# Patient Record
Sex: Female | Born: 1995 | Race: Black or African American | Hispanic: No | Smoking: Never smoker
Health system: Southern US, Community
[De-identification: ages and names within clinical notes are randomized; demographics above are authoritative.]

## PROBLEM LIST (undated history)

## (undated) ENCOUNTER — Inpatient Hospital Stay: Payer: Self-pay

## (undated) DIAGNOSIS — D573 Sickle-cell trait: Secondary | ICD-10-CM

## (undated) DIAGNOSIS — D649 Anemia, unspecified: Secondary | ICD-10-CM

## (undated) DIAGNOSIS — F32A Depression, unspecified: Secondary | ICD-10-CM

## (undated) DIAGNOSIS — F329 Major depressive disorder, single episode, unspecified: Secondary | ICD-10-CM

## (undated) HISTORY — DX: Depression, unspecified: F32.A

## (undated) HISTORY — DX: Sickle-cell trait: D57.3

## (undated) HISTORY — PX: MASTECTOMY: SHX3

## (undated) HISTORY — DX: Morbid (severe) obesity due to excess calories: E66.01

## (undated) HISTORY — DX: Major depressive disorder, single episode, unspecified: F32.9

## (undated) HISTORY — PX: TONSILLECTOMY: SUR1361

---
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
## 2017-02-02 ENCOUNTER — Encounter: Payer: Self-pay | Admitting: Emergency Medicine

## 2017-02-02 DIAGNOSIS — K529 Noninfective gastroenteritis and colitis, unspecified: Secondary | ICD-10-CM | POA: Insufficient documentation

## 2017-02-02 DIAGNOSIS — N390 Urinary tract infection, site not specified: Secondary | ICD-10-CM | POA: Diagnosis not present

## 2017-02-02 DIAGNOSIS — Z87891 Personal history of nicotine dependence: Secondary | ICD-10-CM | POA: Diagnosis not present

## 2017-02-02 DIAGNOSIS — R1030 Lower abdominal pain, unspecified: Secondary | ICD-10-CM | POA: Diagnosis present

## 2017-02-02 LAB — POCT PREGNANCY, URINE: Preg Test, Ur: NEGATIVE

## 2017-02-02 LAB — URINALYSIS, COMPLETE (UACMP) WITH MICROSCOPIC
BILIRUBIN URINE: NEGATIVE
Glucose, UA: NEGATIVE mg/dL
Ketones, ur: NEGATIVE mg/dL
Nitrite: NEGATIVE
PROTEIN: 100 mg/dL — AB
SPECIFIC GRAVITY, URINE: 1.025 (ref 1.005–1.030)
pH: 8 (ref 5.0–8.0)

## 2017-02-02 LAB — COMPREHENSIVE METABOLIC PANEL
ALBUMIN: 4.2 g/dL (ref 3.5–5.0)
ALK PHOS: 73 U/L (ref 38–126)
ALT: 34 U/L (ref 14–54)
AST: 38 U/L (ref 15–41)
Anion gap: 7 (ref 5–15)
BILIRUBIN TOTAL: 0.6 mg/dL (ref 0.3–1.2)
BUN: 6 mg/dL (ref 6–20)
CALCIUM: 9 mg/dL (ref 8.9–10.3)
CO2: 26 mmol/L (ref 22–32)
Chloride: 107 mmol/L (ref 101–111)
Creatinine, Ser: 0.58 mg/dL (ref 0.44–1.00)
GFR calc Af Amer: >60 mL/min (ref >60)
GFR calc non Af Amer: >60 mL/min (ref >60)
Glucose, Bld: 97 mg/dL (ref 65–99)
POTASSIUM: 2.9 mmol/L — AB (ref 3.5–5.1)
SODIUM: 140 mmol/L (ref 135–145)
Total Protein: 8.2 g/dL — ABNORMAL HIGH (ref 6.5–8.1)

## 2017-02-02 LAB — CBC
HEMATOCRIT: 39.2 % (ref 35.0–47.0)
Hemoglobin: 13.6 g/dL (ref 12.0–16.0)
MCH: 24.1 pg — ABNORMAL LOW (ref 26.0–34.0)
MCHC: 34.7 g/dL (ref 32.0–36.0)
MCV: 69.4 fL — AB (ref 80.0–100.0)
Platelets: 285 10*3/uL (ref 150–440)
RBC: 5.66 MIL/uL — ABNORMAL HIGH (ref 3.80–5.20)
RDW: 14.9 % — AB (ref 11.5–14.5)
WBC: 11 10*3/uL (ref 3.6–11.0)

## 2017-02-02 LAB — HCG, QUANTITATIVE, PREGNANCY

## 2017-02-02 LAB — LIPASE, BLOOD: Lipase: 20 U/L (ref 11–51)

## 2017-02-02 NOTE — ED Triage Notes (Signed)
Pt arrives ambulatory to triage with c/o lower abdominal pain. Pt states that she has been having this pain x2 weeks. Pt reports regular bowel movements. Pt is in NAD at this time.

## 2017-02-03 ENCOUNTER — Emergency Department
Admission: EM | Admit: 2017-02-03 | Discharge: 2017-02-03 | Disposition: A | Discharge status: Home / Self Care | Payer: Medicaid Other | Attending: Emergency Medicine | Admitting: Emergency Medicine

## 2017-02-03 ENCOUNTER — Emergency Department: Payer: Medicaid Other

## 2017-02-03 DIAGNOSIS — N390 Urinary tract infection, site not specified: Secondary | ICD-10-CM

## 2017-02-03 DIAGNOSIS — K529 Noninfective gastroenteritis and colitis, unspecified: Secondary | ICD-10-CM

## 2017-02-03 MED ORDER — CIPROFLOXACIN HCL 500 MG PO TABS: 500.0000 mg | ORAL_TABLET | Freq: Two times a day (BID) | ORAL | 0 refills | Status: AC

## 2017-02-03 MED ORDER — METRONIDAZOLE 500 MG PO TABS
500.0000 mg | ORAL_TABLET | Freq: Once | ORAL | Status: AC
  Administered 2017-02-03: 500 mg via ORAL
  Filled 2017-02-03: qty 1

## 2017-02-03 MED ORDER — CIPROFLOXACIN HCL 500 MG PO TABS
500.0000 mg | ORAL_TABLET | ORAL | Status: AC
  Administered 2017-02-03: 500 mg via ORAL
  Filled 2017-02-03: qty 1

## 2017-02-03 MED ORDER — IOPAMIDOL (ISOVUE-300) INJECTION 61%
30.0000 mL | Freq: Once | INTRAVENOUS | Status: AC
  Administered 2017-02-03: 30 mL via ORAL

## 2017-02-03 MED ORDER — POTASSIUM CHLORIDE CRYS ER 20 MEQ PO TBCR: 20.0000 meq | EXTENDED_RELEASE_TABLET | Freq: Every day | ORAL | 0 refills | Status: DC

## 2017-02-03 MED ORDER — ONDANSETRON 4 MG PO TBDP: ORAL_TABLET | ORAL | 0 refills | Status: DC

## 2017-02-03 MED ORDER — HYDROCODONE-ACETAMINOPHEN 5-325 MG PO TABS: 1.0000 | ORAL_TABLET | ORAL | 0 refills | Status: DC | PRN

## 2017-02-03 MED ORDER — IOPAMIDOL (ISOVUE-300) INJECTION 61%
100.0000 mL | Freq: Once | INTRAVENOUS | Status: AC | PRN
  Administered 2017-02-03: 100 mL via INTRAVENOUS

## 2017-02-03 MED ORDER — POTASSIUM CHLORIDE CRYS ER 20 MEQ PO TBCR
40.0000 meq | EXTENDED_RELEASE_TABLET | Freq: Once | ORAL | Status: AC
  Administered 2017-02-03: 40 meq via ORAL
  Filled 2017-02-03: qty 2

## 2017-02-03 MED ORDER — METRONIDAZOLE 500 MG PO TABS: 500.0000 mg | ORAL_TABLET | Freq: Three times a day (TID) | ORAL | 0 refills | Status: AC

## 2017-02-03 MED ORDER — HYDROCODONE-ACETAMINOPHEN 5-325 MG PO TABS
2.0000 | ORAL_TABLET | Freq: Once | ORAL | Status: AC
  Administered 2017-02-03: 2 via ORAL
  Filled 2017-02-03: qty 2

## 2017-02-03 NOTE — ED Provider Notes (Signed)
[facility] Emergency Department Provider Note  ____________________________________________   First MD Initiated Contact with Patient 02/03/17 0041     (approximate)  I have reviewed the triage vital signs and the nursing notes.   HISTORY  Chief Complaint Abdominal Pain    HPI Cindy [name] is a 21 y.o. female who presents for evaluation of gradually worsening abdominal pain 2 weeks.  She reports some intermittent nausea but no vomiting and she has had normal bowel movements, specifically no diarrhea.  She states that the pain is throughout her lower abdomen and nothing in particular makes it better nor worse.  It is a dull and aching pain but severe in intensity.  She is having trouble sleeping over the last few days as a result of the severity of the pain and it is affecting her daily life.  She has a 75-month-old baby and is difficult for her to pick up the baby due to the pain.  She does not have a regular doctor and does not have a gastroenterologist.  She denies fever/chills, chest pain, shortness of breath, dysuria. No vaginal bleeding, no significant vaginal discharge, no pelvic pain.   History reviewed. No pertinent past medical history.  Patient Active Problem List   Diagnosis Date Noted  . Postpartum care following vaginal delivery 10/24/2016    Past Surgical History:  Procedure Laterality Date  . TONSILLECTOMY      Prior to Admission medications   Medication Sig Start Date End Date Taking? Authorizing Provider  ciprofloxacin (CIPRO) 500 MG tablet Take 1 tablet (500 mg total) by mouth 2 (two) times daily. 02/03/17 02/10/17  Hinda Kehr, MD  HYDROcodone-acetaminophen (NORCO/VICODIN) 5-325 MG tablet Take 1-2 tablets by mouth every 4 (four) hours as needed for moderate pain. 02/03/17   Hinda Kehr, MD  metroNIDAZOLE (FLAGYL) 500 MG tablet Take 1 tablet (500 mg total) by mouth 3 (three) times daily. 02/03/17 02/10/17  Hinda Kehr, MD    ondansetron (ZOFRAN ODT) 4 MG disintegrating tablet Allow 1-2 tablets to dissolve in your mouth every 8 hours as needed for nausea/vomiting 02/03/17   Hinda Kehr, MD  potassium chloride SA (KLOR-CON M20) 20 MEQ tablet Take 1 tablet (20 mEq total) by mouth daily. 02/03/17   Hinda Kehr, MD  Prenatal Vit-Fe Fumarate-FA (MULTIVITAMIN-PRENATAL) 27-0.8 MG TABS tablet Take 1 tablet by mouth daily at 12 noon.    Historical Provider, MD    Allergies Patient has no known allergies.  Family History  Problem Relation Age of Onset  . Thyroid disease Mother   . Hypertension Mother   . Sickle cell trait Mother   . Sickle cell anemia Sister   . Hypertension Maternal Grandmother     Social History Social History  Substance Use Topics  . Smoking status: Former Research scientist (life sciences)  . Smokeless tobacco: Never Used  . Alcohol use No     Comment: former    Review of Systems Constitutional: No fever/chills Eyes: No visual changes. ENT: No sore throat. Cardiovascular: Denies chest pain. Respiratory: Denies shortness of breath. Gastrointestinal: Worsening lower abdominal pain 3 weeks with no bowel changes, no vomiting, occasional nausea Genitourinary: Negative for dysuria. Musculoskeletal: Negative for back pain. Skin: Negative for rash. Neurological: Negative for headaches, focal weakness or numbness.  10-point ROS otherwise negative.  ____________________________________________   PHYSICAL EXAM:  VITAL SIGNS: ED Triage Vitals  Enc Vitals Group     BP 02/02/17 2151 138/84     Pulse Rate 02/02/17 2151 100  Resp 02/02/17 2151 18     Temp 02/02/17 2151 99.2 F (37.3 C)     Temp Source 02/02/17 2151 Oral     SpO2 02/02/17 2151 100 %     Weight 02/02/17 2154 171 lb (77.6 kg)     Height 02/02/17 2154 5\' 2"  (1.575 m)     Head Circumference --      Peak Flow --      Pain Score 02/02/17 2151 10     Pain Loc --      Pain Edu? --      Excl. in [place]? --     Constitutional: Alert and oriented.  Well appearing and in no acute distress. Eyes: Conjunctivae are normal. PERRL. EOMI. Head: Atraumatic. Nose: No congestion/rhinnorhea. Mouth/Throat: Mucous membranes are moist. Neck: No stridor.  No meningeal signs.   Cardiovascular: Normal rate, regular rhythm. Good peripheral circulation. Grossly normal heart sounds. Respiratory: Normal respiratory effort.  No retractions. Lungs CTAB. Gastrointestinal: Soft with diffuse tenderness to palpation throughout the abdomen.  No distention.  No rebound and no guarding Genitourinary: Deferred Musculoskeletal: No lower extremity tenderness nor edema. No gross deformities of extremities. Neurologic:  Normal speech and language. No gross focal neurologic deficits are appreciated.  Skin:  Skin is warm, dry and intact. No rash noted. Psychiatric: Mood and affect are normal. Speech and behavior are normal.  ____________________________________________   LABS (all labs ordered are listed, but only abnormal results are displayed)  Labs Reviewed  COMPREHENSIVE METABOLIC PANEL - Abnormal; Notable for the following:       Result Value   Potassium 2.9 (*)    Total Protein 8.2 (*)    All other components within normal limits  CBC - Abnormal; Notable for the following:    RBC 5.66 (*)    MCV 69.4 (*)    MCH 24.1 (*)    RDW 14.9 (*)    All other components within normal limits  URINALYSIS, COMPLETE (UACMP) WITH MICROSCOPIC - Abnormal; Notable for the following:    Color, Urine YELLOW (*)    APPearance HAZY (*)    Hgb urine dipstick MODERATE (*)    Protein, ur 100 (*)    Leukocytes, UA TRACE (*)    Bacteria, UA RARE (*)    Squamous Epithelial / LPF 6-30 (*)    All other components within normal limits  URINE CULTURE  LIPASE, BLOOD  HCG, QUANTITATIVE, PREGNANCY  POC URINE PREG, ED  POCT PREGNANCY, URINE   ____________________________________________  EKG  None - EKG not ordered by ED  physician ____________________________________________  RADIOLOGY   Ct Abdomen Pelvis W Contrast  Result Date: 02/03/2017 CLINICAL DATA:  Lower abdominal pain EXAM: CT ABDOMEN AND PELVIS WITH CONTRAST TECHNIQUE: Multidetector CT imaging of the abdomen and pelvis was performed using the standard protocol following bolus administration of intravenous contrast. CONTRAST:  154mL ISOVUE-300 IOPAMIDOL (ISOVUE-300) INJECTION 61% COMPARISON:  None. FINDINGS: Lower chest: Lung bases demonstrate no acute consolidation or pleural effusion. Normal heart size. Hepatobiliary: No focal liver abnormality is seen. No gallstones, gallbladder wall thickening, or biliary dilatation. Pancreas: Unremarkable. No pancreatic ductal dilatation or surrounding inflammatory changes. Spleen: Upper normal in size at 13.5 cm. Adrenals/Urinary Tract: Adrenal glands are unremarkable. Kidneys are normal, without renal calculi, focal lesion, or hydronephrosis. Bladder is unremarkable. Stomach/Bowel: Stomach is nonenlarged.  No dilated small bowel. Abnormal bowel wall thickening involving the cecum, ascending colon, hepatic flexure, with patchy involvement of the splenic flexure. Thickened terminal ileum and distal ileal small  bowel loops in the anterior pelvis. The appendix is enlarged, measuring up to 9 mm in size, no surrounding inflammation. Vascular/Lymphatic: Right lower quadrant mesenteric lymph nodes. Non aneurysmal aorta Reproductive: Uterus and bilateral adnexa are unremarkable. Other: No free air or free fluid. Musculoskeletal: No acute or significant osseous findings. IMPRESSION: 1. Abnormal wall thickening of distal small bowel loops/ terminal ileum, cecum, ascending colon, hepatic flexure and splenic flexure ; the findings are suspicious for colitis as may be seen with infectious or inflammatory bowel disease. The appendix is also noted to be enlarged up to 1 cm but is without surrounding inflammation; suspect that the enlarged  appendix is part of the diffuse inflammatory process involving the distal small bowel and right colon, however appendicitis cannot be entirely excluded. Electronically Signed   By: Donavan Foil M.D.   On: 02/03/2017 03:17    ____________________________________________   PROCEDURES  Critical Care performed: No   Procedure(s) performed:   Procedures   ____________________________________________   INITIAL IMPRESSION / ASSESSMENT AND PLAN / ED COURSE  Pertinent labs & imaging results that were available during my care of the patient were reviewed by me and considered in my medical decision making (see chart for details).  The patient's labs are reassuring other than a positive UA.  Her vital signs are essentially normal except for a borderline tachycardia.  Given that the pain is affecting her daily life and she is diffusely tender to palpation throughout, I will proceed with the CT scan with oral and IV contrast.  I did discuss the risks and benefits of CT imaging given her young age but we decided it would help identify potential intra-abdominal pathology that may be the cause of her gradually worsening and now severe pain.   Clinical Course as of Feb 03 750  Sat Feb 03, 2017  0430 The patient's CT scan is consistent with diffuse colitis, either infectious or inflammatory.  It is notable that she is not having any diarrhea.  Although her appendix was enlarged the radiologist felt that this was because it was included with the bowel wall thickening and colitis but there was no surrounding inflammation that would suggest acute appendicitis.  Given that she has been having symptoms for 2 weeks, has no fever or leukocytosis, and there is no periappendiceal inflammation, I feel that it is extremely unlikely that she is suffering from appendicitis.  I will treat her empirically with Cipro and Flagyl. I looked up contraindications to these medications with breastfeeding, and the levels in  breastmilk are reportedly quite low.  I am recommending to the patient that she either pump before taking the medications, or wait 3-4 hours before feeding after taking them to reduce the dose in the milk.  [CF]  [id number] I discussed all of the results with the patient and stressed the importance of outpatient follow up with GI.    I gave my usual and customary return precautions.  She understands and agrees with the plan.  I am giving a small amount of potassium given her low potassium  [CF]  0506 Possible UTI as well, culture pending, Cipro should treat but culture will help follow up  [CF]    Clinical Course User Index [CF] Hinda Kehr, MD    ____________________________________________  FINAL CLINICAL IMPRESSION(S) / ED DIAGNOSES  Final diagnoses:  Colitis  Urinary tract infection without hematuria, site unspecified     MEDICATIONS GIVEN DURING THIS VISIT:  Medications  iopamidol (ISOVUE-300) 61 % injection 30 mL (30  mLs Oral Contrast Given 02/03/17 0143)  iopamidol (ISOVUE-300) 61 % injection 100 mL (100 mLs Intravenous Contrast Given 02/03/17 0255)  ciprofloxacin (CIPRO) tablet 500 mg (500 mg Oral Given 02/03/17 0431)  metroNIDAZOLE (FLAGYL) tablet 500 mg (500 mg Oral Given 02/03/17 0431)  HYDROcodone-acetaminophen (NORCO/VICODIN) 5-325 MG per tablet 2 tablet (2 tablets Oral Given 02/03/17 0431)  potassium chloride SA (K-DUR,KLOR-CON) CR tablet 40 mEq (40 mEq Oral Given 02/03/17 0450)     NEW OUTPATIENT MEDICATIONS STARTED DURING THIS VISIT:  Discharge Medication List as of 02/03/2017  5:07 AM    START taking these medications   Details  ciprofloxacin (CIPRO) 500 MG tablet Take 1 tablet (500 mg total) by mouth 2 (two) times daily., Starting Sat 02/03/2017, Until Sat 02/10/2017, Print    HYDROcodone-acetaminophen (NORCO/VICODIN) 5-325 MG tablet Take 1-2 tablets by mouth every 4 (four) hours as needed for moderate pain., Starting Sat 02/03/2017, Print    metroNIDAZOLE (FLAGYL) 500 MG  tablet Take 1 tablet (500 mg total) by mouth 3 (three) times daily., Starting Sat 02/03/2017, Until Sat 02/10/2017, Print    ondansetron (ZOFRAN ODT) 4 MG disintegrating tablet Allow 1-2 tablets to dissolve in your mouth every 8 hours as needed for nausea/vomiting, Print    potassium chloride SA (KLOR-CON M20) 20 MEQ tablet Take 1 tablet (20 mEq total) by mouth daily., Starting Sat 02/03/2017, Print        Discharge Medication List as of 02/03/2017  5:07 AM      Discharge Medication List as of 02/03/2017  5:07 AM       Note:  This document was prepared using Dragon voice recognition software and may include unintentional dictation errors.    Hinda Kehr, MD 02/03/17 [phone]

## 2017-02-03 NOTE — ED Notes (Signed)
Reviewed d/c instructions, follow-up care, prescriptions with patient. Pt verbalized understanding.  

## 2017-02-03 NOTE — ED Notes (Signed)
RN informed patient about additional diagnosis of UTI. RN and patient discussed signs and symptoms of worsening infection.  Patient given additional discharge information relating to UTI.  Patient verbalized understanding

## 2017-02-03 NOTE — Discharge Instructions (Signed)
Your CT scan was consistent with colitis, which is inflammation in parts of your intestines.  It may be infectious, so we are treating you with two different antibiotics; please take the full week-long course of both medications.  If you are breastfeeding, we recommend that you either pump prior to taking your medications each time, or that you wait 3-4 hours after taking the medications before you feed your baby.  Please call the office of Dr. Vicente Males and explain you were seen in the emergency department and have diffuse colitis and would like to schedule a follow-up appointment for possible inflammatory bowel disease.  They should be able to work you into the clinic relatively soon.    Return to the emergency department if you develop new or worsening symptoms that concern you.

## 2017-02-03 NOTE — ED Notes (Signed)
Pt. Was able to drink one bottle of contrast, pt. Does not think she can drink anymore.  Doctor informed, and is ok with one bottle.  CT called.

## 2017-02-04 LAB — URINE CULTURE: Special Requests: NORMAL

## 2017-03-15 ENCOUNTER — Encounter: Payer: Self-pay | Admitting: Gastroenterology

## 2017-03-15 ENCOUNTER — Other Ambulatory Visit: Payer: Self-pay

## 2017-03-15 ENCOUNTER — Ambulatory Visit (INDEPENDENT_AMBULATORY_CARE_PROVIDER_SITE_OTHER): Payer: Medicaid Other | Admitting: Gastroenterology

## 2017-03-15 ENCOUNTER — Telehealth: Payer: Self-pay

## 2017-03-15 VITALS — BP 136/82 | HR 90 | Temp 98.0°F | Ht 60.0 in | Wt 179.4 lb

## 2017-03-15 DIAGNOSIS — R718 Other abnormality of red blood cells: Secondary | ICD-10-CM

## 2017-03-15 DIAGNOSIS — K529 Noninfective gastroenteritis and colitis, unspecified: Secondary | ICD-10-CM

## 2017-03-15 DIAGNOSIS — R1013 Epigastric pain: Secondary | ICD-10-CM | POA: Diagnosis not present

## 2017-03-15 DIAGNOSIS — R809 Proteinuria, unspecified: Secondary | ICD-10-CM

## 2017-03-15 DIAGNOSIS — K51919 Ulcerative colitis, unspecified with unspecified complications: Secondary | ICD-10-CM

## 2017-03-15 DIAGNOSIS — G8929 Other chronic pain: Secondary | ICD-10-CM

## 2017-03-15 MED ORDER — OMEPRAZOLE 40 MG PO CPDR: 40.0000 mg | DELAYED_RELEASE_CAPSULE | Freq: Every day | ORAL | 3 refills | Status: DC

## 2017-03-15 NOTE — Telephone Encounter (Signed)
Gastroenterology Pre-Procedure Review  Request Date: 7/3 Requesting Physician: Dr. Vicente Males  PATIENT REVIEW QUESTIONS: The patient responded to the following health history questions as indicated:    1. Are you having any GI issues? yes (colitis, epigastric pain) 2. Do you have a personal history of Polyps? no 3. Do you have a family history of Colon Cancer or Polyps? no 4. Diabetes Mellitus? no 5. Joint replacements in the past 12 months?no 6. Major health problems in the past 3 months?no 7. Any artificial heart valves, MVP, or defibrillator?no    MEDICATIONS & ALLERGIES:    Patient reports the following regarding taking any anticoagulation/antiplatelet therapy:   Plavix, Coumadin, Eliquis, Xarelto, Lovenox, Pradaxa, Brilinta, or Effient? no Aspirin? no  Patient confirms/reports the following medications:  Current Outpatient Prescriptions  Medication Sig Dispense Refill  . omeprazole (PRILOSEC) 40 MG capsule Take 1 capsule (40 mg total) by mouth daily. 90 capsule 3  . potassium chloride SA (KLOR-CON M20) 20 MEQ tablet Take 1 tablet (20 mEq total) by mouth daily. (Patient not taking: Reported on 03/15/2017) 7 tablet 0   No current facility-administered medications for this visit.     Patient confirms/reports the following allergies:  No Known Allergies  No orders of the defined types were placed in this encounter.   AUTHORIZATION INFORMATION Primary Insurance: 1D#: Group #:  Secondary Insurance: 1D#: Group #:  SCHEDULE INFORMATION: Date: 7/3 Time: Location: [facility]

## 2017-03-15 NOTE — Progress Notes (Signed)
[name] MD, MRCP(U.K) [address]  [place]  [place], [place] [address]  Main: [phone]  Fax: [phone]   Gastroenterology Consultation  Referring Provider:   Emergency room Primary Care Physician:  Department, [facility] Primary Gastroenterologist:  Dr. [name]  Reason for Consultation:     Abdominal pain         HPI:   Cindy [name] is a 21 y.o. y/o female referred for consultation & management  by Dr. Department, [facility].    She was recently seen at the ER on 02/03/17 for abdominal pain , noted to have a alrge qty of blood and protein in her urine . CT scan of the abdoimen showed thickening of the distal small owel loops, TI,cecum ,ascending colon , hepatic flexure and splenic flexure. Enlarged appearance of the appendix. Treated with cipro and flagyl for colitis . On her labs noted to have severe mucrocytosis of 69 and a Hb of 13.6 .   Abdominal pain: Onset: began 2 months back, since the ER visit has improved a bit, she gets the pain once in a few days, each episode lasts in the morning and stops in the evening , sometimes through the night  Site :lower abdominal initially and now in the upper part  Radiation: none  Severity :"bad"  Petra Kuba of pain: sharp in nature, like a knife  Aggravating factors: siting up , meals does not make it worse but does not feel like eating  Relieving factors :when she was given antibiotics it helps, a bowel movements helps at times  Weight loss: no  NSAID use: no  PPI use :none  Gall bladder surgery: no  Frequency of bowel movements: every day , since child birth she says she has seen some blood on her stool  Change in bowel movements: none  Relief with bowel movements: yes  Gas/Bloating/Abdominal distension: yes  No family history of IBD, no skin issues, no joint issues  Her child is 4 months and is not being breast fed. She says she has a family history of sickle cell disease, has been  told she has low iron in the past .   She recalls she had found it hard to concieve.     History reviewed. No pertinent past medical history.  Past Surgical History:  Procedure Laterality Date  . TONSILLECTOMY      Prior to Admission medications   Medication Sig Start Date End Date Taking? Authorizing Provider  potassium chloride SA (KLOR-CON M20) 20 MEQ tablet Take 1 tablet (20 mEq total) by mouth daily. Patient not taking: Reported on 03/15/2017 02/03/17   Hinda Kehr, MD    Family History  Problem Relation Age of Onset  . Thyroid disease Mother   . Hypertension Mother   . Sickle cell trait Mother   . Sickle cell anemia Sister   . Hypertension Maternal Grandmother      Social History  Substance Use Topics  . Smoking status: Former Research scientist (life sciences)  . Smokeless tobacco: Never Used  . Alcohol use No     Comment: former    Allergies as of 03/15/2017  . (No Known Allergies)    Review of Systems:    All systems reviewed and negative except where noted in HPI.   Physical Exam:  BP 136/82 (BP Location: Left Arm, Patient Position: Sitting, Cuff Size: Normal)   Pulse 90   Temp 98 F (36.7 C) (Oral)   Ht 5' (1.524 m)  Wt 179 lb 6.4 oz (81.4 kg)   LMP  (LMP Unknown)   Breastfeeding? No   BMI 35.04 kg/m  No LMP recorded (lmp unknown). Patient has had an injection. Psych:  Alert and cooperative. Normal mood and affect. General:   Alert,  Well-developed, well-nourished, pleasant and cooperative in NAD Head:  Normocephalic and atraumatic. Eyes:  Sclera clear, no icterus.   Conjunctiva pink. Ears:  Normal auditory acuity. Nose:  No deformity, discharge, or lesions. Mouth:  No deformity or lesions,oropharynx pink & moist. Neck:  Supple; no masses or thyromegaly. Lungs:  Respirations even and unlabored.  Clear throughout to auscultation.   No wheezes, crackles, or rhonchi. No acute distress. Heart:  Regular rate and rhythm; no murmurs, clicks, rubs, or gallops. Abdomen:  Normal  bowel sounds.  No bruits.  Soft, mild epigastric tenderness  and non-distended without masses, hepatosplenomegaly or hernias noted.  No guarding or rebound tenderness.    Msk:  Symmetrical without gross deformities. Good, equal movement & strength bilaterally. Pulses:  Normal pulses noted. Extremities:  No clubbing or edema.  No cyanosis. Neurologic:  Alert and oriented x3;  grossly normal neurologically. Psych:  Alert and cooperative. Normal mood and affect.  Imaging Studies: No results found.  Assessment and Plan:   Cindy [name] is a 21 y.o. y/o female has been referred for abdominal pain. Recent imaging suggested inflammation of the colon and small bowel , in addition she has microcytosis, history of rectal bleeding and feels better when she is on antibiotics. I will evaluate her for IBD-crohns disease, r/o sickle cell disease as cause for her microcytosis , check her iron levels, Will empirically treat her with a PPI   Plan  1. Repeat urine analysis , iron studies, Hb electrophoresis  2. EGD+colonoscopy for abdominal pain , evaluation of colitis, ileitis , rectal bleeding  3. Stool H pylori antigen and fecal lactoferrin   I have discussed alternative options, risks & benefits,  which include, but are not limited to, bleeding, infection, perforation,respiratory complication & drug reaction.  The patient agrees with this plan & written consent will be obtained.     Follow up in 8 weeks   Dr [name] MD,MRCP(U.K)

## 2017-04-15 ENCOUNTER — Emergency Department: Payer: Medicaid Other

## 2017-04-15 ENCOUNTER — Emergency Department
Admission: EM | Admit: 2017-04-15 | Discharge: 2017-04-15 | Disposition: A | Discharge status: Home / Self Care | Payer: Medicaid Other | Attending: Student in an Organized Health Care Education/Training Program | Admitting: Student in an Organized Health Care Education/Training Program

## 2017-04-15 ENCOUNTER — Encounter: Payer: Self-pay | Admitting: Emergency Medicine

## 2017-04-15 DIAGNOSIS — R1031 Right lower quadrant pain: Secondary | ICD-10-CM | POA: Insufficient documentation

## 2017-04-15 DIAGNOSIS — Z87891 Personal history of nicotine dependence: Secondary | ICD-10-CM | POA: Insufficient documentation

## 2017-04-15 DIAGNOSIS — Z79899 Other long term (current) drug therapy: Secondary | ICD-10-CM | POA: Diagnosis not present

## 2017-04-15 LAB — COMPREHENSIVE METABOLIC PANEL
ALBUMIN: 4.4 g/dL (ref 3.5–5.0)
ALT: 19 U/L (ref 14–54)
AST: 22 U/L (ref 15–41)
Alkaline Phosphatase: 55 U/L (ref 38–126)
Anion gap: 5 (ref 5–15)
BILIRUBIN TOTAL: 0.8 mg/dL (ref 0.3–1.2)
BUN: 10 mg/dL (ref 6–20)
CALCIUM: 9 mg/dL (ref 8.9–10.3)
CO2: 28 mmol/L (ref 22–32)
CREATININE: 0.6 mg/dL (ref 0.44–1.00)
Chloride: 104 mmol/L (ref 101–111)
GFR calc Af Amer: >60 mL/min (ref >60)
GLUCOSE: 91 mg/dL (ref 65–99)
Potassium: 3.4 mmol/L — ABNORMAL LOW (ref 3.5–5.1)
Sodium: 137 mmol/L (ref 135–145)
TOTAL PROTEIN: 8 g/dL (ref 6.5–8.1)

## 2017-04-15 LAB — URINALYSIS, COMPLETE (UACMP) WITH MICROSCOPIC
BACTERIA UA: NONE SEEN
Bilirubin Urine: NEGATIVE
Glucose, UA: NEGATIVE mg/dL
Ketones, ur: NEGATIVE mg/dL
NITRITE: NEGATIVE
PH: 6 (ref 5.0–8.0)
Protein, ur: 30 mg/dL — AB
SPECIFIC GRAVITY, URINE: 1.029 (ref 1.005–1.030)

## 2017-04-15 LAB — CBC
HEMATOCRIT: 37.9 % (ref 35.0–47.0)
Hemoglobin: 12.9 g/dL (ref 12.0–16.0)
MCH: 23.6 pg — ABNORMAL LOW (ref 26.0–34.0)
MCHC: 34.1 g/dL (ref 32.0–36.0)
MCV: 69.1 fL — ABNORMAL LOW (ref 80.0–100.0)
PLATELETS: 321 10*3/uL (ref 150–440)
RBC: 5.48 MIL/uL — ABNORMAL HIGH (ref 3.80–5.20)
RDW: 15.8 % — AB (ref 11.5–14.5)
WBC: 7.2 10*3/uL (ref 3.6–11.0)

## 2017-04-15 LAB — LIPASE, BLOOD: Lipase: 19 U/L (ref 11–51)

## 2017-04-15 LAB — POCT PREGNANCY, URINE: Preg Test, Ur: NEGATIVE

## 2017-04-15 MED ORDER — IOPAMIDOL (ISOVUE-300) INJECTION 61%
100.0000 mL | Freq: Once | INTRAVENOUS | Status: AC | PRN
  Administered 2017-04-15: 100 mL via INTRAVENOUS

## 2017-04-15 MED ORDER — DICYCLOMINE HCL 20 MG PO TABS: 20.0000 mg | ORAL_TABLET | Freq: Three times a day (TID) | ORAL | 0 refills | Status: DC | PRN

## 2017-04-15 MED ORDER — MORPHINE SULFATE (PF) 4 MG/ML IV SOLN
4.0000 mg | INTRAVENOUS | Status: DC | PRN
  Administered 2017-04-15: 4 mg via INTRAVENOUS
  Filled 2017-04-15: qty 1

## 2017-04-15 MED ORDER — ONDANSETRON HCL 4 MG/2ML IJ SOLN
4.0000 mg | Freq: Once | INTRAMUSCULAR | Status: AC
  Administered 2017-04-15: 4 mg via INTRAVENOUS
  Filled 2017-04-15: qty 2

## 2017-04-15 MED ORDER — IOPAMIDOL (ISOVUE-300) INJECTION 61%: 15.0000 mL | INTRAVENOUS | Status: DC

## 2017-04-15 MED ORDER — POLYETHYLENE GLYCOL 3350 17 G PO PACK: 17.0000 g | PACK | Freq: Every day | ORAL | 0 refills | Status: DC

## 2017-04-15 MED ORDER — PROMETHAZINE HCL 12.5 MG PO TABS: 12.5000 mg | ORAL_TABLET | Freq: Four times a day (QID) | ORAL | 0 refills | Status: DC | PRN

## 2017-04-15 MED ORDER — SODIUM CHLORIDE 0.9 % IV BOLUS (SEPSIS)
1000.0000 mL | Freq: Once | INTRAVENOUS | Status: AC
  Administered 2017-04-15: 1000 mL via INTRAVENOUS

## 2017-04-15 NOTE — Discharge Instructions (Signed)

## 2017-04-15 NOTE — ED Provider Notes (Addendum)
[facility] Emergency Department Provider Note    First MD Initiated Contact with Patient 04/15/17 1545     (approximate)  I have reviewed the triage vital signs and the nursing notes.   HISTORY  Chief Complaint Abdominal Pain    HPI Cindy [name] is a 21 y.o. female presents with suprapubic and right lower quadrant abdominal pain. Has had similar episodes in the past and was seen and April with diffuse colitis. She is treated with antibiotics with improvement in symptoms and have follow-up with GI plan for this Tuesday.  States that the pain and worsening over the past 2-3 days associated with nausea and chills. No radiation of pain. Has had decreased oral intake.    History reviewed. No pertinent past medical history. Family History  Problem Relation Age of Onset  . Thyroid disease Mother   . Hypertension Mother   . Sickle cell trait Mother   . Sickle cell anemia Sister   . Hypertension Maternal Grandmother    Past Surgical History:  Procedure Laterality Date  . TONSILLECTOMY     Patient Active Problem List   Diagnosis Date Noted  . Postpartum care following vaginal delivery 10/24/2016      Prior to Admission medications   Medication Sig Start Date End Date Taking? Authorizing Provider  omeprazole (PRILOSEC) 40 MG capsule Take 1 capsule (40 mg total) by mouth daily. 03/15/17   [name], MD  potassium chloride SA (KLOR-CON M20) 20 MEQ tablet Take 1 tablet (20 mEq total) by mouth daily. Patient not taking: Reported on 03/15/2017 02/03/17   Hinda Kehr, MD    Allergies Patient has no known allergies.    Social History Social History  Substance Use Topics  . Smoking status: Former Research scientist (life sciences)  . Smokeless tobacco: Never Used  . Alcohol use No     Comment: former    Review of Systems Patient denies headaches, rhinorrhea, blurry vision, numbness, shortness of breath, chest pain, edema, cough, abdominal pain, nausea, vomiting,  diarrhea, dysuria, fevers, rashes or hallucinations unless otherwise stated above in HPI. ____________________________________________   PHYSICAL EXAM:  VITAL SIGNS: Vitals:   04/15/17 1154  BP: 131/90  Pulse: 79  Resp: 18  Temp: 98.7 F (37.1 C)    Constitutional: Alert and oriented. Well appearing and in no acute distress. Eyes: Conjunctivae are normal.  Head: Atraumatic. Nose: No congestion/rhinnorhea. Mouth/Throat: Mucous membranes are moist.   Neck: No stridor. Painless ROM.  Cardiovascular: Normal rate, regular rhythm. Grossly normal heart sounds.  Good peripheral circulation. Respiratory: Normal respiratory effort.  No retractions. Lungs CTAB. Gastrointestinal: Soft with RLQ ttp, no guarding or rebound. No distention. No abdominal bruits. No CVA tenderness. Musculoskeletal: No lower extremity tenderness nor edema.  No joint effusions. Neurologic:  Normal speech and language. No gross focal neurologic deficits are appreciated. No facial droop Skin:  Skin is warm, dry and intact. No rash noted. Psychiatric: Mood and affect are normal. Speech and behavior are normal.  ____________________________________________   LABS (all labs ordered are listed, but only abnormal results are displayed)  Results for orders placed or performed during the hospital encounter of 04/15/17 (from the past 24 hour(s))  Lipase, blood     Status: None   Collection Time: 04/15/17 12:14 PM  Result Value Ref Range   Lipase 19 11 - 51 U/L  Comprehensive metabolic panel     Status: Abnormal   Collection Time: 04/15/17 12:14 PM  Result Value Ref Range   Sodium 137  135 - 145 mmol/L   Potassium 3.4 (L) 3.5 - 5.1 mmol/L   Chloride 104 101 - 111 mmol/L   CO2 28 22 - 32 mmol/L   Glucose, Bld 91 65 - 99 mg/dL   BUN 10 6 - 20 mg/dL   Creatinine, Ser 0.60 0.44 - 1.00 mg/dL   Calcium 9.0 8.9 - 10.3 mg/dL   Total Protein 8.0 6.5 - 8.1 g/dL   Albumin 4.4 3.5 - 5.0 g/dL   AST 22 15 - 41 U/L   ALT 19  14 - 54 U/L   Alkaline Phosphatase 55 38 - 126 U/L   Total Bilirubin 0.8 0.3 - 1.2 mg/dL   GFR calc non Af Amer >60 >60 mL/min   GFR calc Af Amer >60 >60 mL/min   Anion gap 5 5 - 15  CBC     Status: Abnormal   Collection Time: 04/15/17 12:14 PM  Result Value Ref Range   WBC 7.2 3.6 - 11.0 K/uL   RBC 5.48 (H) 3.80 - 5.20 MIL/uL   Hemoglobin 12.9 12.0 - 16.0 g/dL   HCT 37.9 35.0 - 47.0 %   MCV 69.1 (L) 80.0 - 100.0 fL   MCH 23.6 (L) 26.0 - 34.0 pg   MCHC 34.1 32.0 - 36.0 g/dL   RDW 15.8 (H) 11.5 - 14.5 %   Platelets 321 150 - 440 K/uL  Urinalysis, Complete w Microscopic     Status: Abnormal   Collection Time: 04/15/17 12:14 PM  Result Value Ref Range   Color, Urine AMBER (A) YELLOW   APPearance HAZY (A) CLEAR   Specific Gravity, Urine 1.029 1.005 - 1.030   pH 6.0 5.0 - 8.0   Glucose, UA NEGATIVE NEGATIVE mg/dL   Hgb urine dipstick MODERATE (A) NEGATIVE   Bilirubin Urine NEGATIVE NEGATIVE   Ketones, ur NEGATIVE NEGATIVE mg/dL   Protein, ur 30 (A) NEGATIVE mg/dL   Nitrite NEGATIVE NEGATIVE   Leukocytes, UA SMALL (A) NEGATIVE   RBC / HPF 0-5 0 - 5 RBC/hpf   WBC, UA 6-30 0 - 5 WBC/hpf   Bacteria, UA NONE SEEN NONE SEEN   Squamous Epithelial / LPF 0-5 (A) NONE SEEN   Mucous PRESENT   Pregnancy, urine POC     Status: None   Collection Time: 04/15/17 12:23 PM  Result Value Ref Range   Preg Test, Ur NEGATIVE NEGATIVE   ____________________________________________ _____________________________  RADIOLOGY  I personally reviewed all radiographic images ordered to evaluate for the above acute complaints and reviewed radiology reports and findings.  These findings were personally discussed with the patient.  Please see medical record for radiology report.  ____________________________________________   PROCEDURES  Procedure(s) performed:  Procedures    Critical Care performed: no ____________________________________________   INITIAL IMPRESSION / ASSESSMENT AND PLAN /  ED COURSE  Pertinent labs & imaging results that were available during my care of the patient were reviewed by me and considered in my medical decision making (see chart for details).  DDX: colitis, ibd, crohns, infectious colitis, abscess, appendicitis, uti, cyst  Cindy [name] is a 21 y.o. who presents to the ED with abdominal pain as described above. She rises afebrile and hemodynamic stable. No blood in her stool. Based on her exam and recent CT showing evidence of dilated appendix with worsening pain in that location will order CT imaging to evaluate for any evidence of abscess or appendicitis. We'll also identify any worsening colitis that would be suggestive of Crohn's or IBD.  She denies any vaginal discharge. She is not pregnant. Pain certainly seems to be more abdominal than pelvic. The patient will be placed on continuous pulse oximetry and telemetry for monitoring.  Laboratory evaluation will be sent to evaluate for the above complaints.       ----------------------------------------- 6:50 PM on 04/15/2017 -----------------------------------------  Pain has improved. Repeat CT imaging shows no evidence of appendicitis, perforation, abscess, TOA or uterine or adnexal mass. Colitis seems to have completely resolved. Patient in no acute distress with reassuring blood work. At this point he felt that she stable for follow-up with her GI doctor. Have discussed with the patient and available family all diagnostics and treatments performed thus far and all questions were answered to the best of my ability. The patient demonstrates understanding and agreement with plan.   ____________________________________________   FINAL CLINICAL IMPRESSION(S) / ED DIAGNOSES  Final diagnoses:  Right lower quadrant abdominal pain      NEW MEDICATIONS STARTED DURING THIS VISIT:  New Prescriptions   No medications on file     Note:  This document was prepared using Dragon voice recognition  software and may include unintentional dictation errors.    Merlyn Lot, MD 04/15/17 1850    Merlyn Lot, MD 04/15/17 [phone]

## 2017-04-15 NOTE — ED Triage Notes (Signed)
Patient states that she was seen here prior (in April) for "inflammation in her bowel". Patient was prescribed and finished a course of antibiotics, however the patient states the pain is returning.   Patient has an appointment with GI [facility] on 04-17-17 however patient states the pain is too intense to wait

## 2017-04-17 ENCOUNTER — Ambulatory Visit: Admission: RE | Admit: 2017-04-17 | Payer: Medicaid Other | Source: Ambulatory Visit | Admitting: Gastroenterology

## 2017-04-17 ENCOUNTER — Encounter: Admission: RE | Payer: Self-pay | Source: Ambulatory Visit

## 2017-04-17 SURGERY — ESOPHAGOGASTRODUODENOSCOPY (EGD) WITH PROPOFOL
Anesthesia: General

## 2017-05-01 ENCOUNTER — Encounter: Payer: Self-pay | Admitting: Gastroenterology

## 2017-05-01 ENCOUNTER — Other Ambulatory Visit
Admission: RE | Admit: 2017-05-01 | Discharge: 2017-05-01 | Disposition: A | Discharge status: Home / Self Care | Payer: Medicaid Other | Source: Ambulatory Visit | Attending: Gastroenterology | Admitting: Gastroenterology

## 2017-05-01 ENCOUNTER — Ambulatory Visit (INDEPENDENT_AMBULATORY_CARE_PROVIDER_SITE_OTHER): Payer: Medicaid Other | Admitting: Gastroenterology

## 2017-05-01 VITALS — BP 112/74 | HR 84 | Temp 98.4°F | Ht 61.0 in | Wt 181.8 lb

## 2017-05-01 DIAGNOSIS — R809 Proteinuria, unspecified: Secondary | ICD-10-CM | POA: Insufficient documentation

## 2017-05-01 DIAGNOSIS — K51919 Ulcerative colitis, unspecified with unspecified complications: Secondary | ICD-10-CM | POA: Diagnosis present

## 2017-05-01 DIAGNOSIS — R718 Other abnormality of red blood cells: Secondary | ICD-10-CM | POA: Insufficient documentation

## 2017-05-01 DIAGNOSIS — R1030 Lower abdominal pain, unspecified: Secondary | ICD-10-CM | POA: Diagnosis not present

## 2017-05-01 DIAGNOSIS — R319 Hematuria, unspecified: Secondary | ICD-10-CM

## 2017-05-01 DIAGNOSIS — K529 Noninfective gastroenteritis and colitis, unspecified: Secondary | ICD-10-CM | POA: Diagnosis not present

## 2017-05-01 DIAGNOSIS — R1013 Epigastric pain: Secondary | ICD-10-CM | POA: Insufficient documentation

## 2017-05-01 DIAGNOSIS — G8929 Other chronic pain: Secondary | ICD-10-CM | POA: Insufficient documentation

## 2017-05-01 LAB — IRON AND TIBC
IRON: 73 ug/dL (ref 28–170)
SATURATION RATIOS: 17 % (ref 10.4–31.8)
TIBC: 443 ug/dL (ref 250–450)
UIBC: 370 ug/dL

## 2017-05-01 LAB — FERRITIN: Ferritin: 9 ng/mL — ABNORMAL LOW (ref 11–307)

## 2017-05-01 NOTE — Addendum Note (Signed)
Addended by: Peggye Ley on: 05/01/2017 01:24 PM   Modules accepted: Orders

## 2017-05-01 NOTE — Progress Notes (Signed)
[name] MD, MRCP(U.K) [address]  [place]  [place], [place] [address]  Main: [phone]  Fax: [phone]   Primary Care Physician: Patient, No Pcp Per  Primary Gastroenterologist:  Dr. [name]   Chief Complaint  Patient presents with  . Abdominal Pain    HPI: Cindy [name] is a 21 y.o. female     Summary of history :  She was recently seen on 03/15/17 for abdominal pain at my office. She was seen at the ER on 02/03/17 for abdominal pain , noted to have a alrge qty of blood and protein in her urine . CT scan of the abdomen showed thickening of the distal small bowel loops, TI,cecum ,ascending colon , hepatic flexure and splenic flexure. Enlarged appearance of the appendix. Treated with cipro and flagyl for colitis . On her labs noted to have severe microcytosis of 69 and a Hb of 13.6 .  The abdominal pain was going on since 12/2016 , once every few days , in the morning and stops in the evening , lower part of the abdomen , non radiating , sharp in nature , sitting up made it worse , better after a bowel movement and antibiotics , no nsaid use , noted blood in her stool.   Interval history   02/2017-  05/01/2017   I had ordered iron studies , stool studies, urine studies which were not obtained.  She was seen at the ER on 04/15/17 for abdominal pain  Lipase , preg test ,CMP was normal Hb 12.9 with MCV 69 . Urine analysis showed moderate blood and protein in the urine.  CT scan of the abdomen was performed on 04/15/17 which showed a moderate stool burden otherwise normal.   Since the ER visit feels better but still has on and off similar pains. Denies any NSAID use. Presently has a bowel movement daily and soft . She says she has seen blood in her urine. On and off .   She has stopped taking Prilosec cant be sure.     Current Outpatient Prescriptions  Medication Sig Dispense Refill  . promethazine (PHENERGAN) 12.5 MG tablet Take 1 tablet (12.5 mg total) by mouth  every 6 (six) hours as needed for nausea or vomiting. 12 tablet 0  . dicyclomine (BENTYL) 20 MG tablet Take 1 tablet (20 mg total) by mouth 3 (three) times daily as needed for spasms. (Patient not taking: Reported on 05/01/2017) 20 tablet 0  . omeprazole (PRILOSEC) 40 MG capsule Take 1 capsule (40 mg total) by mouth daily. (Patient not taking: Reported on 05/01/2017) 90 capsule 3  . polyethylene glycol (MIRALAX / GLYCOLAX) packet Take 17 g by mouth daily. Mix one tablespoon with 8oz of your favorite juice or water every day until you are having soft formed stools. Then start taking once daily if you didn't have a stool the day before. (Patient not taking: Reported on 05/01/2017) 30 each 0  . potassium chloride SA (KLOR-CON M20) 20 MEQ tablet Take 1 tablet (20 mEq total) by mouth daily. (Patient not taking: Reported on 03/15/2017) 7 tablet 0   No current facility-administered medications for this visit.     Allergies as of 05/01/2017  . (No Known Allergies)    ROS:  General: Negative for anorexia, weight loss, fever, chills, fatigue, weakness. ENT: Negative for hoarseness, difficulty swallowing , nasal congestion. CV: Negative for chest pain, angina, palpitations, dyspnea on exertion, peripheral edema.  Respiratory: Negative for dyspnea at rest, dyspnea  on exertion, cough, sputum, wheezing.  GI: See history of present illness. GU:  Negative for dysuria, hematuria, urinary incontinence, urinary frequency, nocturnal urination.  Endo: Negative for unusual weight change.    Physical Examination:   BP 112/74   Pulse 84   Temp 98.4 F (36.9 C) (Oral)   Ht 5\' 1"  (1.549 m)   Wt 181 lb 12.8 oz (82.5 kg)   BMI 34.35 kg/m   General: Well-nourished, well-developed in no acute distress.  Eyes: No icterus. Conjunctivae pink. Mouth: Oropharyngeal mucosa moist and pink , no lesions erythema or exudate. Lungs: Clear to auscultation bilaterally. Non-labored. Heart: Regular rate and rhythm, no murmurs  rubs or gallops.  Abdomen: Bowel sounds are normal, nontender, nondistended, no hepatosplenomegaly or masses, no abdominal bruits or hernia , no rebound or guarding.   Extremities: No lower extremity edema. No clubbing or deformities. Neuro: Alert and oriented x 3.  Grossly intact. Skin: Warm and dry, no jaundice.   Psych: Alert and cooperative, normal mood and affect.   Imaging Studies: Ct Abdomen Pelvis W Contrast  Result Date: 04/15/2017 CLINICAL DATA:  Right lower quadrant pain. EXAM: CT ABDOMEN AND PELVIS WITH CONTRAST TECHNIQUE: Multidetector CT imaging of the abdomen and pelvis was performed using the standard protocol following bolus administration of intravenous contrast. CONTRAST:  120mL ISOVUE-300 IOPAMIDOL (ISOVUE-300) INJECTION 61% COMPARISON:  02/03/2017 FINDINGS: Lower chest: Lung bases are clear. No effusions. Heart is normal size. Hepatobiliary: No focal hepatic abnormality. Gallbladder unremarkable. Pancreas: No focal abnormality or ductal dilatation. Spleen: No focal abnormality.  Normal size. Adrenals/Urinary Tract: No adrenal abnormality. No focal renal abnormality. No stones or hydronephrosis. Urinary bladder is unremarkable. Stomach/Bowel: Normal appendix. Moderate stool burden throughout the colon. Stomach, large and small bowel grossly unremarkable. Vascular/Lymphatic: No evidence of aneurysm or adenopathy. Reproductive: Uterus and adnexa unremarkable.  No mass. Other: No free fluid or free air. Musculoskeletal: No acute bony abnormality. IMPRESSION: No acute findings in the abdomen or pelvis. Moderate stool burden. Electronically Signed   By: Rolm Baptise M.D.   On: 04/15/2017 18:03    Assessment and Plan:   Cindy [name] is a 21 y.o. y/o female  here to follow up  for abdominal pain. Imaging in 01/2017  suggested inflammation of the colon and small bowel(resolved on repeat scan on 04/2017 ) , in addition she has microcytosis, history of rectal bleeding and feels better when  she is on antibiotics. I suggest she pursues evaluation for  IBD-crohns disease, r/o sickle cell disease as cause for her microcytosis , check her iron levels,  Did not follow up with recommendations at last visit. Stressed on the importance of doing so   Impression  1. Abdominal pain with history of colitis/ileitis 2. Microcytosis  3. Blood and protein in urine.  4. Needs a PCP   Plan  1. Refer to nephrology for blood in the urine as well as protein  2. Iron studies, Hb electrophoresis  2. EGD+colonoscopy for abdominal pain , evaluation of colitis, ileitis , rectal bleeding  3. Stool H pylori antigen and fecal lactoferrin  4. Set up PCP 5. Continue PPI 6. High fiber diet , Miralax PRN.   Dr [name]  MD,MRCP [facility]) Follow up in 3 months

## 2017-05-08 ENCOUNTER — Other Ambulatory Visit: Payer: Self-pay

## 2017-05-08 DIAGNOSIS — D509 Iron deficiency anemia, unspecified: Secondary | ICD-10-CM

## 2017-05-08 MED ORDER — FERROUS SULFATE 325 (65 FE) MG PO TABS: 325.0000 mg | ORAL_TABLET | Freq: Two times a day (BID) | ORAL | 0 refills | Status: DC

## 2017-05-09 ENCOUNTER — Other Ambulatory Visit: Payer: Self-pay

## 2017-05-09 ENCOUNTER — Telehealth: Payer: Self-pay

## 2017-05-09 ENCOUNTER — Telehealth: Payer: Self-pay | Admitting: Gastroenterology

## 2017-05-09 DIAGNOSIS — K51919 Ulcerative colitis, unspecified with unspecified complications: Secondary | ICD-10-CM

## 2017-05-09 DIAGNOSIS — G8929 Other chronic pain: Secondary | ICD-10-CM

## 2017-05-09 DIAGNOSIS — R1013 Epigastric pain: Secondary | ICD-10-CM

## 2017-05-09 MED ORDER — PEG 3350-KCL-NA BICARB-NACL 420 G PO SOLR: 4000.0000 mL | Freq: Once | ORAL | 0 refills | Status: AC

## 2017-05-09 NOTE — Telephone Encounter (Signed)
05/09/17 MCD  NO prior auth required for EGD 43235 / Colonoscopy 24932 for K51.919, R10.13 Vicente Males, [facility], 06/15/17)

## 2017-05-09 NOTE — Telephone Encounter (Signed)
LVM for patient callback to schedule EGD & colonoscopy with Dr. Vicente Males.

## 2017-05-09 NOTE — Telephone Encounter (Signed)
LVM for callback from Dr. Anthonette Legato to verify receipt of referral from Dr. Vicente Males.

## 2017-05-09 NOTE — Telephone Encounter (Signed)
Gastroenterology Pre-Procedure Review  Request Date: 8/31 Requesting Physician: Dr. Vicente Males  PATIENT REVIEW QUESTIONS: The patient responded to the following health history questions as indicated:    1. Are you having any GI issues? yes (UC) 2. Do you have a personal history of Polyps? no 3. Do you have a family history of Colon Cancer or Polyps? no 4. Diabetes Mellitus? no 5. Joint replacements in the past 12 months?no 6. Major health problems in the past 3 months?no 7. Any artificial heart valves, MVP, or defibrillator?no    MEDICATIONS & ALLERGIES:    Patient reports the following regarding taking any anticoagulation/antiplatelet therapy:   Plavix, Coumadin, Eliquis, Xarelto, Lovenox, Pradaxa, Brilinta, or Effient? no Aspirin? no  Patient confirms/reports the following medications:  Current Outpatient Prescriptions  Medication Sig Dispense Refill  . dicyclomine (BENTYL) 20 MG tablet Take 1 tablet (20 mg total) by mouth 3 (three) times daily as needed for spasms. (Patient not taking: Reported on 05/01/2017) 20 tablet 0  . ferrous sulfate 325 (65 FE) MG tablet Take 1 tablet (325 mg total) by mouth 2 (two) times daily with a meal. 180 tablet 0  . omeprazole (PRILOSEC) 40 MG capsule Take 1 capsule (40 mg total) by mouth daily. (Patient not taking: Reported on 05/01/2017) 90 capsule 3  . polyethylene glycol (MIRALAX / GLYCOLAX) packet Take 17 g by mouth daily. Mix one tablespoon with 8oz of your favorite juice or water every day until you are having soft formed stools. Then start taking once daily if you didn't have a stool the day before. (Patient not taking: Reported on 05/01/2017) 30 each 0  . potassium chloride SA (KLOR-CON M20) 20 MEQ tablet Take 1 tablet (20 mEq total) by mouth daily. (Patient not taking: Reported on 03/15/2017) 7 tablet 0  . promethazine (PHENERGAN) 12.5 MG tablet Take 1 tablet (12.5 mg total) by mouth every 6 (six) hours as needed for nausea or vomiting. 12 tablet 0   No  current facility-administered medications for this visit.     Patient confirms/reports the following allergies:  No Known Allergies  No orders of the defined types were placed in this encounter.   AUTHORIZATION INFORMATION Primary Insurance: 1D#: Group #:  Secondary Insurance: 1D#: Group #:  SCHEDULE INFORMATION: Date: 8/31 Time: Location: [place]

## 2017-06-13 ENCOUNTER — Emergency Department
Admission: EM | Admit: 2017-06-13 | Discharge: 2017-06-13 | Disposition: A | Discharge status: Home / Self Care | Payer: Medicaid Other | Attending: Emergency Medicine | Admitting: Emergency Medicine

## 2017-06-13 ENCOUNTER — Emergency Department: Payer: Medicaid Other

## 2017-06-13 ENCOUNTER — Encounter: Payer: Self-pay | Admitting: Emergency Medicine

## 2017-06-13 DIAGNOSIS — R110 Nausea: Secondary | ICD-10-CM | POA: Diagnosis present

## 2017-06-13 DIAGNOSIS — Z87891 Personal history of nicotine dependence: Secondary | ICD-10-CM | POA: Insufficient documentation

## 2017-06-13 DIAGNOSIS — M6281 Muscle weakness (generalized): Secondary | ICD-10-CM | POA: Diagnosis not present

## 2017-06-13 DIAGNOSIS — Z79899 Other long term (current) drug therapy: Secondary | ICD-10-CM | POA: Diagnosis not present

## 2017-06-13 DIAGNOSIS — N390 Urinary tract infection, site not specified: Secondary | ICD-10-CM | POA: Diagnosis not present

## 2017-06-13 DIAGNOSIS — R102 Pelvic and perineal pain: Secondary | ICD-10-CM

## 2017-06-13 LAB — URINALYSIS, COMPLETE (UACMP) WITH MICROSCOPIC
Bacteria, UA: NONE SEEN
Bilirubin Urine: NEGATIVE
GLUCOSE, UA: NEGATIVE mg/dL
Ketones, ur: NEGATIVE mg/dL
Nitrite: NEGATIVE
PH: 7 (ref 5.0–8.0)
PROTEIN: 100 mg/dL — AB
Specific Gravity, Urine: 1.028 (ref 1.005–1.030)

## 2017-06-13 LAB — CBC
HCT: 38.3 % (ref 35.0–47.0)
Hemoglobin: 13.1 g/dL (ref 12.0–16.0)
MCH: 24.1 pg — AB (ref 26.0–34.0)
MCHC: 34.2 g/dL (ref 32.0–36.0)
MCV: 70.4 fL — ABNORMAL LOW (ref 80.0–100.0)
PLATELETS: 283 10*3/uL (ref 150–440)
RBC: 5.44 MIL/uL — ABNORMAL HIGH (ref 3.80–5.20)
RDW: 16.3 % — ABNORMAL HIGH (ref 11.5–14.5)
WBC: 8.6 10*3/uL (ref 3.6–11.0)

## 2017-06-13 LAB — WET PREP, GENITAL
CLUE CELLS WET PREP: NONE SEEN
SPERM: NONE SEEN
TRICH WET PREP: NONE SEEN
WBC, Wet Prep HPF POC: NONE SEEN
YEAST WET PREP: NONE SEEN

## 2017-06-13 LAB — COMPREHENSIVE METABOLIC PANEL
ALK PHOS: 56 U/L (ref 38–126)
ALT: 15 U/L (ref 14–54)
ANION GAP: 8 (ref 5–15)
AST: 17 U/L (ref 15–41)
Albumin: 4.4 g/dL (ref 3.5–5.0)
BILIRUBIN TOTAL: 0.4 mg/dL (ref 0.3–1.2)
BUN: 8 mg/dL (ref 6–20)
CALCIUM: 9.2 mg/dL (ref 8.9–10.3)
CO2: 25 mmol/L (ref 22–32)
Chloride: 107 mmol/L (ref 101–111)
Creatinine, Ser: 0.54 mg/dL (ref 0.44–1.00)
GFR calc non Af Amer: >60 mL/min (ref >60)
GLUCOSE: 91 mg/dL (ref 65–99)
Potassium: 3.2 mmol/L — ABNORMAL LOW (ref 3.5–5.1)
Sodium: 140 mmol/L (ref 135–145)
TOTAL PROTEIN: 7.9 g/dL (ref 6.5–8.1)

## 2017-06-13 LAB — POCT PREGNANCY, URINE: Preg Test, Ur: NEGATIVE

## 2017-06-13 LAB — CHLAMYDIA/NGC RT PCR (ARMC ONLY)
Chlamydia Tr: NOT DETECTED
N gonorrhoeae: NOT DETECTED

## 2017-06-13 LAB — LIPASE, BLOOD: Lipase: 23 U/L (ref 11–51)

## 2017-06-13 MED ORDER — ONDANSETRON HCL 4 MG PO TABS: 4.0000 mg | ORAL_TABLET | Freq: Three times a day (TID) | ORAL | 0 refills | Status: AC | PRN

## 2017-06-13 MED ORDER — CEPHALEXIN 500 MG PO CAPS
500.0000 mg | ORAL_CAPSULE | Freq: Once | ORAL | Status: AC
  Administered 2017-06-13: 500 mg via ORAL
  Filled 2017-06-13: qty 1

## 2017-06-13 MED ORDER — SODIUM CHLORIDE 0.9 % IV BOLUS (SEPSIS)
1000.0000 mL | Freq: Once | INTRAVENOUS | Status: AC
  Administered 2017-06-13: 1000 mL via INTRAVENOUS

## 2017-06-13 MED ORDER — KETOROLAC TROMETHAMINE 30 MG/ML IJ SOLN
30.0000 mg | Freq: Once | INTRAMUSCULAR | Status: AC
  Administered 2017-06-13: 30 mg via INTRAVENOUS
  Filled 2017-06-13: qty 1

## 2017-06-13 MED ORDER — CEPHALEXIN 500 MG PO CAPS: 500.0000 mg | ORAL_CAPSULE | Freq: Two times a day (BID) | ORAL | 0 refills | Status: AC

## 2017-06-13 NOTE — ED Triage Notes (Signed)
Patient presents to ED via POV from home c/o nausea. Patient states, "My boyfriend thinks I'm pregnant. I don't really know. I think my iron is low".

## 2017-06-13 NOTE — ED Notes (Signed)
Patient transported to [facility] 

## 2017-06-13 NOTE — ED Provider Notes (Signed)
[facility] Emergency Department Provider Note ____________________________________________   First MD Initiated Contact with Patient 06/13/17 1659     (approximate)  I have reviewed the triage vital signs and the nursing notes.   HISTORY  Chief Complaint Nausea    HPI Cindy [name] is a 21 y.o. female hx anemia who presents with nausea x 1 day, associated with lightheadedness and lower abd crampy pain, also preceded by several weeks of generalized weakness and malaise.  Pt denies vomiting, diarrhea, or fever or urinary sx.  She does report some vaginal bleeding consistent with her period.  No discharge.  History reviewed. No pertinent past medical history.  Patient Active Problem List   Diagnosis Date Noted  . Postpartum care following vaginal delivery 10/24/2016    Past Surgical History:  Procedure Laterality Date  . TONSILLECTOMY      Prior to Admission medications   Medication Sig Start Date End Date Taking? Authorizing Provider  cephALEXin (KEFLEX) 500 MG capsule Take 1 capsule (500 mg total) by mouth 2 (two) times daily. 06/14/17 06/21/17  Arta Silence, MD  dicyclomine (BENTYL) 20 MG tablet Take 1 tablet (20 mg total) by mouth 3 (three) times daily as needed for spasms. Patient not taking: Reported on 05/01/2017 04/15/17 04/15/18  Merlyn Lot, MD  ferrous sulfate 325 (65 FE) MG tablet Take 1 tablet (325 mg total) by mouth 2 (two) times daily with a meal. 05/08/17 08/06/17  [name], MD  omeprazole (PRILOSEC) 40 MG capsule Take 1 capsule (40 mg total) by mouth daily. Patient not taking: Reported on 05/01/2017 03/15/17   [name], MD  ondansetron [facility]) 4 MG tablet Take 1 tablet (4 mg total) by mouth every 8 (eight) hours as needed for nausea or vomiting. 06/13/17 06/13/18  Arta Silence, MD  polyethylene glycol (MIRALAX / Floria Raveling) packet Take 17 g by mouth daily. Mix one tablespoon with 8oz of your favorite juice or water every  day until you are having soft formed stools. Then start taking once daily if you didn't have a stool the day before. Patient not taking: Reported on 05/01/2017 04/15/17   Merlyn Lot, MD  potassium chloride SA (KLOR-CON M20) 20 MEQ tablet Take 1 tablet (20 mEq total) by mouth daily. Patient not taking: Reported on 03/15/2017 02/03/17   Hinda Kehr, MD  promethazine (PHENERGAN) 12.5 MG tablet Take 1 tablet (12.5 mg total) by mouth every 6 (six) hours as needed for nausea or vomiting. 04/15/17   Merlyn Lot, MD    Allergies Patient has no known allergies.  Family History  Problem Relation Age of Onset  . Thyroid disease Mother   . Hypertension Mother   . Sickle cell trait Mother   . Sickle cell anemia Sister   . Hypertension Maternal Grandmother     Social History Social History  Substance Use Topics  . Smoking status: Former Research scientist (life sciences)  . Smokeless tobacco: Never Used  . Alcohol use No     Comment: former    Review of Systems  Constitutional: No fever/chills Eyes: No visual changes. ENT: No sore throat. Cardiovascular: Denies chest pain. Respiratory: Denies shortness of breath. Gastrointestinal: Positive for nausea.  No diarrhea.  Genitourinary: Negative for dysuria. Positive for vaginal bleeding.  Musculoskeletal: Negative for back pain. Skin: Negative for rash. Neurological: Negative for headaches, focal weakness or numbness.  Positive for lightheadedness.    ____________________________________________   PHYSICAL EXAM:  VITAL SIGNS: ED Triage Vitals [06/13/17 1634]  Enc Vitals Group  BP (!) 142/74     Pulse Rate 86     Resp 18     Temp 98.8 F (37.1 C)     Temp Source Oral     SpO2 100 %     Weight 180 lb (81.6 kg)     Height 5' (1.524 m)     Head Circumference      Peak Flow      Pain Score      Pain Loc      Pain Edu?      Excl. in [place]?     Constitutional: Alert and oriented. Well appearing and in no acute distress. Eyes: Conjunctivae are  normal. No pallor.  Head: Atraumatic. Nose: No congestion/rhinnorhea. Mouth/Throat: Mucous membranes are moist.   Neck: Normal range of motion.  Cardiovascular: Normal rate, regular rhythm. Grossly normal heart sounds.  Good peripheral circulation. Respiratory: Normal respiratory effort.  No retractions. Lungs CTAB. Gastrointestinal: Soft with mild midline/bilateral suprapubic tenderness. No distention.  Genitourinary: Mild bilat CVA tenderness.  External genitalia normal.  No CMT, mild bilat adnexal tenderness.  Small amt blood in vault, no active bleeding, no discharge.  Musculoskeletal: No lower extremity edema.  Extremities warm and well perfused.  Neurologic:  Normal speech and language. No gross focal neurologic deficits are appreciated.  Skin:  Skin is warm and dry. No rash noted. Psychiatric: Mood and affect are normal. Speech and behavior are normal.  ____________________________________________   LABS (all labs ordered are listed, but only abnormal results are displayed)  Labs Reviewed  COMPREHENSIVE METABOLIC PANEL - Abnormal; Notable for the following:       Result Value   Potassium 3.2 (*)    All other components within normal limits  CBC - Abnormal; Notable for the following:    RBC 5.44 (*)    MCV 70.4 (*)    MCH 24.1 (*)    RDW 16.3 (*)    All other components within normal limits  URINALYSIS, COMPLETE (UACMP) WITH MICROSCOPIC - Abnormal; Notable for the following:    Color, Urine AMBER (*)    APPearance HAZY (*)    Hgb urine dipstick LARGE (*)    Protein, ur 100 (*)    Leukocytes, UA TRACE (*)    Squamous Epithelial / LPF 6-30 (*)    All other components within normal limits  WET PREP, GENITAL  CHLAMYDIA/NGC RT PCR ([facility] ONLY)  LIPASE, BLOOD  POC URINE PREG, ED  POCT PREGNANCY, URINE   ____________________________________________  EKG  ED ECG REPORT I, Arta Silence, the attending physician, personally viewed and interpreted this  ECG.  Date: 06/13/2017 EKG Time: 17:49 Rate: 74 Rhythm: normal sinus rhythm QRS Axis: normal Intervals: normal ST/T Wave abnormalities: T wave inversion V2, nonspecific Narrative Interpretation: no evidence of acute ischemia or other acute findings  ____________________________________________  RADIOLOGY  Pelvic US unremarkable.    ____________________________________________   PROCEDURES  Procedure(s) performed: No    Critical Care performed: No ____________________________________________   INITIAL IMPRESSION / ASSESSMENT AND PLAN / ED COURSE  Pertinent labs & imaging results that were available during my care of the patient were reviewed by me and considered in my medical decision making (see chart for details).  21 y/o F p/w generalized weakness for several weeks, and with nausea, lightheadedness and lower abd crampy pain x 1 day.  VS normal except slight htn, pt well appearing, abd soft with mild suprapubic tenderness, and the remainder of exam is as described.  Overall presentation most  c/w dysmenorrhea, ovarian cyst, fibroid, endometriosis or other benign gynecologic cause.  Given well appearance and minimal tenderness on exam there is no e/o PID, ovarian torsion or other emergent cause.  Also possible viral AGE or other viral cause.  No fever, diarrhea, or tenderness elsewhere in the abdomen to suggest appendicitis or acute colitis although pt has hx of colitis in the past.  Plan: basic labs, IV fluids, UA, wet prep, and reassess.      ----------------------------------------- 8:56 PM on 06/13/2017 -----------------------------------------  Patient had worsened pain, so I added pelvic US to r/o acute gynecologic processes.  It is negative. Pt now much more comfortable after toradol and fluids.  Pt's UA is grossly positive.  Other lab workup is unremarkable, as is wet prep and gc/ct.  Diagnosis therefore consistent with UTI/cystitis.  Will d/c with abx.  Return  precautions given.  Pt feels well to go home.   ____________________________________________   FINAL CLINICAL IMPRESSION(S) / ED DIAGNOSES  Final diagnoses:  Pelvic pain  Urinary tract infection without hematuria, site unspecified      NEW MEDICATIONS STARTED DURING THIS VISIT:  Discharge Medication List as of 06/13/2017  9:05 PM    START taking these medications   Details  cephALEXin (KEFLEX) 500 MG capsule Take 1 capsule (500 mg total) by mouth 2 (two) times daily., Starting Thu 06/14/2017, Until Thu 06/21/2017, Print    ondansetron (ZOFRAN) 4 MG tablet Take 1 tablet (4 mg total) by mouth every 8 (eight) hours as needed for nausea or vomiting., Starting Wed 06/13/2017, Until Thu 06/13/2018, Print         Note:  This document was prepared using Dragon voice recognition software and may include unintentional dictation errors.    Arta Silence, MD 06/13/17 2340

## 2017-06-13 NOTE — Discharge Instructions (Signed)
Take the antibiotic as prescribed and finish the full course.  Return to the ER for new or worsening pain, vomiting, fever, weakness or any other symptoms that concern you.

## 2017-06-15 ENCOUNTER — Ambulatory Visit
Admission: RE | Admit: 2017-06-15 | Discharge: 2017-06-15 | Disposition: A | Discharge status: Home / Self Care | Payer: Medicaid Other | Source: Ambulatory Visit | Attending: Gastroenterology | Admitting: Gastroenterology

## 2017-06-15 ENCOUNTER — Ambulatory Visit: Payer: Medicaid Other | Admitting: Anesthesiology

## 2017-06-15 ENCOUNTER — Encounter: Payer: Self-pay | Admitting: *Deleted

## 2017-06-15 ENCOUNTER — Other Ambulatory Visit: Payer: Self-pay

## 2017-06-15 ENCOUNTER — Encounter
Admission: RE | Disposition: A | Discharge status: Home / Self Care | Payer: Self-pay | Source: Ambulatory Visit | Attending: Gastroenterology

## 2017-06-15 DIAGNOSIS — R319 Hematuria, unspecified: Secondary | ICD-10-CM

## 2017-06-15 DIAGNOSIS — Z79899 Other long term (current) drug therapy: Secondary | ICD-10-CM | POA: Insufficient documentation

## 2017-06-15 DIAGNOSIS — R1013 Epigastric pain: Secondary | ICD-10-CM | POA: Diagnosis not present

## 2017-06-15 DIAGNOSIS — Z87891 Personal history of nicotine dependence: Secondary | ICD-10-CM | POA: Diagnosis not present

## 2017-06-15 DIAGNOSIS — D509 Iron deficiency anemia, unspecified: Secondary | ICD-10-CM | POA: Insufficient documentation

## 2017-06-15 DIAGNOSIS — K51911 Ulcerative colitis, unspecified with rectal bleeding: Secondary | ICD-10-CM | POA: Diagnosis not present

## 2017-06-15 DIAGNOSIS — K51919 Ulcerative colitis, unspecified with unspecified complications: Secondary | ICD-10-CM

## 2017-06-15 DIAGNOSIS — G8929 Other chronic pain: Secondary | ICD-10-CM

## 2017-06-15 HISTORY — DX: Anemia, unspecified: D64.9

## 2017-06-15 HISTORY — PX: ESOPHAGOGASTRODUODENOSCOPY (EGD) WITH PROPOFOL: SHX5813

## 2017-06-15 HISTORY — PX: COLONOSCOPY WITH PROPOFOL: SHX5780

## 2017-06-15 LAB — POCT PREGNANCY, URINE: Preg Test, Ur: NEGATIVE

## 2017-06-15 SURGERY — ESOPHAGOGASTRODUODENOSCOPY (EGD) WITH PROPOFOL
Anesthesia: General

## 2017-06-15 MED ORDER — LIDOCAINE HCL (CARDIAC) 20 MG/ML IV SOLN
INTRAVENOUS | Status: DC | PRN
Start: 2017-06-15 — End: 2017-06-15
  Administered 2017-06-15: 40 mg via INTRAVENOUS

## 2017-06-15 MED ORDER — FENTANYL CITRATE (PF) 250 MCG/5ML IJ SOLN
INTRAMUSCULAR | Status: AC
Start: 2017-06-15 — End: 2017-06-15
  Filled 2017-06-15: qty 5

## 2017-06-15 MED ORDER — SODIUM CHLORIDE 0.9 % IV SOLN
INTRAVENOUS | Status: DC
Start: 2017-06-15 — End: 2017-06-15
  Administered 2017-06-15: 11:00:00 via INTRAVENOUS
  Administered 2017-06-15: 1000 mL via INTRAVENOUS
  Administered 2017-06-15: 11:00:00 via INTRAVENOUS

## 2017-06-15 MED ORDER — PROPOFOL 10 MG/ML IV BOLUS
INTRAVENOUS | Status: DC | PRN
  Administered 2017-06-15: 30 mg via INTRAVENOUS
  Administered 2017-06-15: 50 mg via INTRAVENOUS

## 2017-06-15 MED ORDER — MIDAZOLAM HCL 2 MG/2ML IJ SOLN
INTRAMUSCULAR | Status: DC | PRN
  Administered 2017-06-15: 2 mg via INTRAVENOUS

## 2017-06-15 MED ORDER — LIDOCAINE HCL (PF) 2 % IJ SOLN
INTRAMUSCULAR | Status: AC
  Filled 2017-06-15: qty 2

## 2017-06-15 MED ORDER — PROPOFOL 500 MG/50ML IV EMUL
INTRAVENOUS | Status: AC
  Filled 2017-06-15: qty 50

## 2017-06-15 MED ORDER — MIDAZOLAM HCL 2 MG/2ML IJ SOLN
INTRAMUSCULAR | Status: AC
  Filled 2017-06-15: qty 2

## 2017-06-15 MED ORDER — PROPOFOL 500 MG/50ML IV EMUL
INTRAVENOUS | Status: DC | PRN
  Administered 2017-06-15: 175 ug/kg/min via INTRAVENOUS

## 2017-06-15 NOTE — Anesthesia Postprocedure Evaluation (Signed)
Anesthesia Post Note  Patient: Harrington Challenger  Procedure(s) Performed: Procedure(s) (LRB): ESOPHAGOGASTRODUODENOSCOPY (EGD) WITH PROPOFOL (N/A) COLONOSCOPY WITH PROPOFOL (N/A)  Patient location during evaluation: Endoscopy Anesthesia Type: General Level of consciousness: awake and alert Pain management: pain level controlled Vital Signs Assessment: post-procedure vital signs reviewed and stable Respiratory status: spontaneous breathing, nonlabored ventilation, respiratory function stable and patient connected to nasal cannula oxygen Cardiovascular status: blood pressure returned to baseline and stable Postop Assessment: no signs of nausea or vomiting Anesthetic complications: no     Last Vitals:  Vitals:   06/15/17 1124 06/15/17 1130  BP: 113/70 123/79  Pulse: 70 71  Resp: (!) 23 20  Temp:    SpO2: 100% 100%    Last Pain:  Vitals:   06/15/17 1114  TempSrc: Tympanic  PainSc:                  Precious Haws Leida Luton

## 2017-06-15 NOTE — Op Note (Signed)
[facility] Gastroenterology Patient Name: Cindy [name] Procedure Date: 06/15/2017 10:55 AM MRN: [id number] Account #: [id number] Date of Birth: 11-10-1995 Admit Type: Outpatient Age: 21 Room: [facility] ENDO ROOM 4 Gender: Female Note Status: Finalized Procedure:            Upper GI endoscopy Indications:          Iron deficiency anemia Providers:            [name] MD, MD Referring MD:         No Local Md, MD (Referring MD) Medicines:            Monitored Anesthesia Care Complications:        No immediate complications. Procedure:            Pre-Anesthesia Assessment:                       - Prior to the procedure, a History and Physical was                        performed, and patient medications, allergies and                        sensitivities were reviewed. The patient's tolerance of                        previous anesthesia was reviewed.                       - The risks and benefits of the procedure and the                        sedation options and risks were discussed with the                        patient. All questions were answered and informed                        consent was obtained.                       - ASA Grade Assessment: II - A patient with mild                        systemic disease.                       After obtaining informed consent, the endoscope was                        passed under direct vision. Throughout the procedure,                        the patient's blood pressure, pulse, and oxygen                        saturations were monitored continuously. The Endoscope                        was introduced through the mouth, and advanced to the  third part of duodenum. The upper GI endoscopy was                        accomplished with ease. The patient tolerated the                        procedure well. Findings:      The esophagus was normal.      The stomach was normal.      The examined  duodenum was normal. Biopsies for histology were taken with       a cold forceps for evaluation of celiac disease. Impression:           - Normal esophagus.                       - Normal stomach.                       - Normal examined duodenum. Biopsied. Recommendation:       - Await pathology results.                       - Perform a colonoscopy today. Procedure Code(s):    --- Professional ---                       [phone], Esophagogastroduodenoscopy, flexible, transoral;                        with biopsy, single or multiple Diagnosis Code(s):    --- Professional ---                       D50.9, Iron deficiency anemia, unspecified CPT copyright 2016 American Medical Association. All rights reserved. The codes documented in this report are preliminary and upon coder review may  be revised to meet current compliance requirements. [name], MD [name] MD, MD 06/15/2017 11:05:29 AM This report has been signed electronically. Number of Addenda: 0 Note Initiated On: 06/15/2017 10:55 AM      [facility]

## 2017-06-15 NOTE — Transfer of Care (Signed)
Immediate Anesthesia Transfer of Care Note  Patient: Cindy [name]  Procedure(s) Performed: Procedure(s): ESOPHAGOGASTRODUODENOSCOPY (EGD) WITH PROPOFOL (N/A) COLONOSCOPY WITH PROPOFOL (N/A)  Patient Location: Endoscopy Unit  Anesthesia Type:General  Level of Consciousness: drowsy and patient cooperative  Airway & Oxygen Therapy: Patient Spontanous Breathing and Patient connected to nasal cannula oxygen  Post-op Assessment: Report given to RN and Post -op Vital signs reviewed and stable  Post vital signs: Reviewed and stable  Last Vitals:  Vitals:   06/15/17 1004  BP: (!) 124/104  Pulse: 84  Resp: 16  Temp: 37.2 C    Last Pain:  Vitals:   06/15/17 1004  TempSrc: Tympanic  PainSc: 9          Complications: No apparent anesthesia complications

## 2017-06-15 NOTE — Anesthesia Preprocedure Evaluation (Signed)
Anesthesia Evaluation  Patient identified by MRN, date of birth, ID band Patient awake    Reviewed: Allergy & Precautions, H&P , NPO status , Patient's Chart, lab work & pertinent test results  Airway Mallampati: III  TM Distance: >3 FB Neck ROM: full    Dental  (+) Chipped   Pulmonary neg shortness of breath, former smoker,           Cardiovascular Exercise Tolerance: Good (-) angina(-) Past MI and (-) DOE negative cardio ROS       Neuro/Psych negative neurological ROS  negative psych ROS   GI/Hepatic negative GI ROS, Neg liver ROS, neg GERD  ,  Endo/Other  negative endocrine ROS  Renal/GU negative Renal ROS  negative genitourinary   Musculoskeletal   Abdominal   Peds  Hematology negative hematology ROS (+)   Anesthesia Other Findings Past Medical History: No date: Anemia  Past Surgical History: No date: TONSILLECTOMY  BMI    Body Mass Index:  35.15 kg/m      Reproductive/Obstetrics negative OB ROS                             Anesthesia Physical Anesthesia Plan  ASA: II  Anesthesia Plan: General   Post-op Pain Management:    Induction: Intravenous  PONV Risk Score and Plan: Propofol infusion  Airway Management Planned: Natural Airway and Nasal Cannula  Additional Equipment:   Intra-op Plan:   Post-operative Plan:   Informed Consent: I have reviewed the patients History and Physical, chart, labs and discussed the procedure including the risks, benefits and alternatives for the proposed anesthesia with the patient or authorized representative who has indicated his/her understanding and acceptance.   Dental Advisory Given  Plan Discussed with: Anesthesiologist, CRNA and Surgeon  Anesthesia Plan Comments: (Patient consented for risks of anesthesia including but not limited to:  - adverse reactions to medications - risk of intubation if required - damage to teeth,  lips or other oral mucosa - sore throat or hoarseness - Damage to heart, brain, lungs or loss of life  Patient voiced understanding.)        Anesthesia Quick Evaluation

## 2017-06-15 NOTE — H&P (Signed)
[name] MD [address], [place],  [address] Phone: [phone] Fax : [phone]  Primary Care Physician:  Patient, No Pcp Per Primary Gastroenterologist:  Dr. [name]   Pre-Procedure History & Physical: HPI:  Cindy [name] is a 21 y.o. female is here for an endoscopy and colonoscopy.   Past Medical History:  Diagnosis Date  . Anemia     Past Surgical History:  Procedure Laterality Date  . TONSILLECTOMY      Prior to Admission medications   Medication Sig Start Date End Date Taking? Authorizing Provider  cephALEXin (KEFLEX) 500 MG capsule Take 1 capsule (500 mg total) by mouth 2 (two) times daily. 06/14/17 06/21/17 Yes Arta Silence, MD  dicyclomine (BENTYL) 20 MG tablet Take 1 tablet (20 mg total) by mouth 3 (three) times daily as needed for spasms. 04/15/17 04/15/18 Yes Merlyn Lot, MD  ferrous sulfate 325 (65 FE) MG tablet Take 1 tablet (325 mg total) by mouth 2 (two) times daily with a meal. 05/08/17 08/06/17 Yes [name], MD  omeprazole (PRILOSEC) 40 MG capsule Take 1 capsule (40 mg total) by mouth daily. 03/15/17  Yes [name], MD  ondansetron (ZOFRAN) 4 MG tablet Take 1 tablet (4 mg total) by mouth every 8 (eight) hours as needed for nausea or vomiting. 06/13/17 06/13/18 Yes Arta Silence, MD  potassium chloride SA (KLOR-CON M20) 20 MEQ tablet Take 1 tablet (20 mEq total) by mouth daily. 02/03/17  Yes Hinda Kehr, MD  promethazine (PHENERGAN) 12.5 MG tablet Take 1 tablet (12.5 mg total) by mouth every 6 (six) hours as needed for nausea or vomiting. 04/15/17  Yes Merlyn Lot, MD  polyethylene glycol [facility] / Floria Raveling) packet Take 17 g by mouth daily. Mix one tablespoon with 8oz of your favorite juice or water every day until you are having soft formed stools. Then start taking once daily if you didn't have a stool the day before. Patient not taking: Reported on 05/01/2017 04/15/17   Merlyn Lot, MD    Allergies as of  05/09/2017  . (No Known Allergies)    Family History  Problem Relation Age of Onset  . Thyroid disease Mother   . Hypertension Mother   . Sickle cell trait Mother   . Sickle cell anemia Sister   . Hypertension Maternal Grandmother     Social History   Social History  . Marital status: Single    Spouse name: N/A  . Number of children: N/A  . Years of education: N/A   Occupational History  . Not on file.   Social History Main Topics  . Smoking status: Former Research scientist (life sciences)  . Smokeless tobacco: Never Used  . Alcohol use No     Comment: former  . Drug use: Yes    Types: Marijuana     Comment: last used in sept  . Sexual activity: Yes    Partners: Male    Birth control/ protection: Injection   Other Topics Concern  . Not on file   Social History Narrative  . No narrative on file    Review of Systems: See HPI, otherwise negative ROS  Physical Exam: There were no vitals taken for this visit. General:   Alert,  pleasant and cooperative in NAD Head:  Normocephalic and atraumatic. Neck:  Supple; no masses or thyromegaly. Lungs:  Clear throughout to auscultation.    Heart:  Regular rate and rhythm. Abdomen:  Soft, nontender and nondistended. Normal bowel sounds, without guarding, and without rebound.  Neurologic:  Alert and  oriented x4;  grossly normal neurologically.  Impression/Plan: Cindy [name] is here for an endoscopy and colonoscopy to be performed for abdominal pain, rectal bleeding and iron deficiency anemia.   Risks, benefits, limitations, and alternatives regarding  endoscopy and colonoscopy have been reviewed with the patient.  Questions have been answered.  All parties agreeable.   [name], MD  06/15/2017, 10:02 AM

## 2017-06-15 NOTE — Anesthesia Post-op Follow-up Note (Signed)
Anesthesia QCDR form completed.        

## 2017-06-15 NOTE — Op Note (Signed)
[facility] Gastroenterology Patient Name: Cindy [name] Procedure Date: 06/15/2017 10:54 AM MRN: [id number] Account #: [id number] Date of Birth: Oct 19, 1995 Admit Type: Outpatient Age: 21 Room: [facility] ENDO ROOM 4 Gender: Female Note Status: Finalized Procedure:            Colonoscopy Indications:          Iron deficiency anemia Providers:            [name] MD, MD Referring MD:         No Local Md, MD (Referring MD) Medicines:            Monitored Anesthesia Care Complications:        No immediate complications. Procedure:            Pre-Anesthesia Assessment:                       - Prior to the procedure, a History and Physical was                        performed, and patient medications, allergies and                        sensitivities were reviewed. The patient's tolerance of                        previous anesthesia was reviewed.                       - The risks and benefits of the procedure and the                        sedation options and risks were discussed with the                        patient. All questions were answered and informed                        consent was obtained.                       - ASA Grade Assessment: II - A patient with mild                        systemic disease.                       After obtaining informed consent, the colonoscope was                        passed under direct vision. Throughout the procedure,                        the patient's blood pressure, pulse, and oxygen                        saturations were monitored continuously. The Olympus                        CF-H180AL colonoscope ( S#: [id number] ) was introduced  through the anus with the intention of advancing to the                        cecum. The scope was advanced to the rectum before the                        procedure was aborted. Medications were given. The                        colonoscopy was performed with ease.  The patient                        tolerated the procedure well. The quality of the bowel                        preparation was inadequate. Findings:      The perianal and digital rectal examinations were normal.      A large amount of solid stool was found in the rectum, precluding       visualization. Impression:           - Preparation of the colon was inadequate.                       - Stool in the rectum.                       - No specimens collected. Recommendation:       - Discharge patient to home (with escort).                       - Advance diet as tolerated.                       - Repeat colonoscopy in 2 weeks because the bowel                        preparation was suboptimal. Procedure Code(s):    --- Professional ---                       [phone], 53, Colonoscopy, flexible; diagnostic, including                        collection of specimen(s) by brushing or washing, when                        performed (separate procedure) Diagnosis Code(s):    --- Professional ---                       D50.9, Iron deficiency anemia, unspecified CPT copyright 2016 American Medical Association. All rights reserved. The codes documented in this report are preliminary and upon coder review may  be revised to meet current compliance requirements. [name], MD [name] MD, MD 06/15/2017 11:09:34 AM This report has been signed electronically. Number of Addenda: 0 Note Initiated On: 06/15/2017 10:54 AM Total Procedure Duration: 0 hours 0 minutes 36 seconds       [facility]

## 2017-06-19 ENCOUNTER — Encounter: Payer: Self-pay | Admitting: Gastroenterology

## 2017-06-19 LAB — SURGICAL PATHOLOGY

## 2017-06-21 ENCOUNTER — Encounter: Payer: Self-pay | Admitting: Emergency Medicine

## 2017-06-21 ENCOUNTER — Emergency Department
Admission: EM | Admit: 2017-06-21 | Discharge: 2017-06-21 | Disposition: A | Discharge status: Home / Self Care | Payer: Medicaid Other | Attending: Emergency Medicine | Admitting: Emergency Medicine

## 2017-06-21 DIAGNOSIS — R319 Hematuria, unspecified: Secondary | ICD-10-CM | POA: Insufficient documentation

## 2017-06-21 DIAGNOSIS — Z79899 Other long term (current) drug therapy: Secondary | ICD-10-CM | POA: Diagnosis not present

## 2017-06-21 DIAGNOSIS — Z87891 Personal history of nicotine dependence: Secondary | ICD-10-CM | POA: Diagnosis not present

## 2017-06-21 DIAGNOSIS — M545 Low back pain: Secondary | ICD-10-CM | POA: Diagnosis present

## 2017-06-21 LAB — URINALYSIS, COMPLETE (UACMP) WITH MICROSCOPIC
BACTERIA UA: NONE SEEN
BILIRUBIN URINE: NEGATIVE
Glucose, UA: NEGATIVE mg/dL
Ketones, ur: NEGATIVE mg/dL
NITRITE: NEGATIVE
PROTEIN: 100 mg/dL — AB
SPECIFIC GRAVITY, URINE: 1.024 (ref 1.005–1.030)
pH: 6 (ref 5.0–8.0)

## 2017-06-21 LAB — CBC
HCT: 38.7 % (ref 35.0–47.0)
Hemoglobin: 13.4 g/dL (ref 12.0–16.0)
MCH: 24.1 pg — ABNORMAL LOW (ref 26.0–34.0)
MCHC: 34.6 g/dL (ref 32.0–36.0)
MCV: 69.7 fL — AB (ref 80.0–100.0)
PLATELETS: 309 10*3/uL (ref 150–440)
RBC: 5.56 MIL/uL — AB (ref 3.80–5.20)
RDW: 15.9 % — AB (ref 11.5–14.5)
WBC: 7.5 10*3/uL (ref 3.6–11.0)

## 2017-06-21 LAB — BASIC METABOLIC PANEL
ANION GAP: 8 (ref 5–15)
BUN: 8 mg/dL (ref 6–20)
CALCIUM: 9.4 mg/dL (ref 8.9–10.3)
CO2: 25 mmol/L (ref 22–32)
Chloride: 106 mmol/L (ref 101–111)
Creatinine, Ser: 0.55 mg/dL (ref 0.44–1.00)
GFR calc Af Amer: >60 mL/min (ref >60)
Glucose, Bld: 93 mg/dL (ref 65–99)
POTASSIUM: 3.2 mmol/L — AB (ref 3.5–5.1)
Sodium: 139 mmol/L (ref 135–145)

## 2017-06-21 LAB — POCT PREGNANCY, URINE: PREG TEST UR: NEGATIVE

## 2017-06-21 NOTE — ED Triage Notes (Signed)
Presents with lower back pain since last pm  Also having some blood in urine

## 2017-06-21 NOTE — ED Provider Notes (Signed)
[facility] Emergency Department Provider Note  Time seen: 3:05 PM  I have reviewed the triage vital signs and the nursing notes.   HISTORY  Chief Complaint Back Pain    HPI Cindy [name] is a 21 y.o. female With a past medical history of anemia, presents to the emergency department for lower back pain and hematuria. According to the patient since January when she had her daughter she has been experiencing fairly constant hematuria. Denies dysuria. Patient has seen her doctor several times, has gone through several rounds of antibiotics. Patient states her last few days she has been experiencing back pain was worsened somewhat last night and continues to have hematuria although denies any dysuria. Patient came to the emergency department today for evaluation. Of note the patient was placed on antibiotics approximate 7 days ago for a presumed urinary tract infection.  Past Medical History:  Diagnosis Date  . Anemia     Patient Active Problem List   Diagnosis Date Noted  . Postpartum care following vaginal delivery 10/24/2016    Past Surgical History:  Procedure Laterality Date  . COLONOSCOPY WITH PROPOFOL N/A 06/15/2017   Procedure: COLONOSCOPY WITH PROPOFOL;  Surgeon: [name], MD;  Location: [facility] ENDOSCOPY;  Service: Gastroenterology;  Laterality: N/A;  . ESOPHAGOGASTRODUODENOSCOPY (EGD) WITH PROPOFOL N/A 06/15/2017   Procedure: ESOPHAGOGASTRODUODENOSCOPY (EGD) WITH PROPOFOL;  Surgeon: [name], MD;  Location: [facility] ENDOSCOPY;  Service: Gastroenterology;  Laterality: N/A;  . TONSILLECTOMY      Prior to Admission medications   Medication Sig Start Date End Date Taking? Authorizing Provider  cephALEXin (KEFLEX) 500 MG capsule Take 1 capsule (500 mg total) by mouth 2 (two) times daily. 06/14/17 06/21/17  Arta Silence, MD  dicyclomine (BENTYL) 20 MG tablet Take 1 tablet (20 mg total) by mouth 3 (three) times daily as needed for spasms. 04/15/17 04/15/18   Merlyn Lot, MD  ferrous sulfate 325 (65 FE) MG tablet Take 1 tablet (325 mg total) by mouth 2 (two) times daily with a meal. 05/08/17 08/06/17  [name], MD  omeprazole (PRILOSEC) 40 MG capsule Take 1 capsule (40 mg total) by mouth daily. 03/15/17   [name], MD  ondansetron (ZOFRAN) 4 MG tablet Take 1 tablet (4 mg total) by mouth every 8 (eight) hours as needed for nausea or vomiting. 06/13/17 06/13/18  Arta Silence, MD  polyethylene glycol (MIRALAX / Floria Raveling) packet Take 17 g by mouth daily. Mix one tablespoon with 8oz of your favorite juice or water every day until you are having soft formed stools. Then start taking once daily if you didn't have a stool the day before. Patient not taking: Reported on 05/01/2017 04/15/17   Merlyn Lot, MD  potassium chloride SA (KLOR-CON M20) 20 MEQ tablet Take 1 tablet (20 mEq total) by mouth daily. 02/03/17   Hinda Kehr, MD  promethazine (PHENERGAN) 12.5 MG tablet Take 1 tablet (12.5 mg total) by mouth every 6 (six) hours as needed for nausea or vomiting. 04/15/17   Merlyn Lot, MD    No Known Allergies  Family History  Problem Relation Age of Onset  . Thyroid disease Mother   . Hypertension Mother   . Sickle cell trait Mother   . Sickle cell anemia Sister   . Hypertension Maternal Grandmother     Social History Social History  Substance Use Topics  . Smoking status: Former Research scientist (life sciences)  . Smokeless tobacco: Never Used  . Alcohol use No     Comment: former  Review of Systems Constitutional: Negative for fever. Cardiovascular: Negative for chest pain. Respiratory: Negative for shortness of breath. Gastrointestinal: Negative for abdominal pain, vomiting and diarrhea. Genitourinary: negative for dysuria.positive for hematuria 8 months. Neurological: Negative for headache All other ROS negative  ____________________________________________   PHYSICAL EXAM:  VITAL SIGNS: ED Triage Vitals  Enc Vitals Group     BP  06/21/17 1345 123/72     Pulse Rate 06/21/17 1345 81     Resp 06/21/17 1345 20     Temp 06/21/17 1345 99.5 F (37.5 C)     Temp Source 06/21/17 1345 Oral     SpO2 06/21/17 1345 99 %     Weight 06/21/17 1342 180 lb (81.6 kg)     Height 06/21/17 1342 5' (1.524 m)     Head Circumference --      Peak Flow --      Pain Score 06/21/17 1342 10     Pain Loc --      Pain Edu? --      Excl. in [place]? --     Constitutional: Alert and oriented. Well appearing and in no distress. Eyes: Normal exam ENT   Head: Normocephalic and atraumatic.   Mouth/Throat: Mucous membranes are moist. Cardiovascular: Normal rate, regular rhythm. No murmur Respiratory: Normal respiratory effort without tachypnea nor retractions. Breath sounds are clear  Gastrointestinal: Soft and nontender. No distention.   Musculoskeletal: Nontender with normal range of motion in all extremities.  Neurologic:  Normal speech and language. No gross focal neurologic deficits. Skin:  Skin is warm, dry and intact.  Psychiatric: Mood and affect are normal.   ____________________________________________      INITIAL IMPRESSION / ASSESSMENT AND PLAN / ED COURSE  Pertinent labs & imaging results that were available during my care of the patient were reviewed by me and considered in my medical decision making (see chart for details).  the patient presents the emergency department with hematuria for 8 months along with lower back pain for the past 2 days. Patient has had an extensive workup of the past several months including a CT of the abdomen/pelvis in July which was negative. Pelvis ultrasound recently which was normal. Pelvic exam with wet prep that was normal. Patient over the past couple weeks had an endoscopy which was normal and is scheduled for colonoscopy in 2 weeks. Patient was placed on Keflex one week ago for a possible urinary tract infection. On today's workup the patient's urinalysis shows blood but does not show an  appreciable amount of white cells or bacteria. We will send a urine culture as a precaution. Patient's lab work is normal. I do not believe repeat CT imaging would be of much use at this time as her symptoms have been constant for 8 months. Patient states she has not seen a urologist, I strongly urge the patient to call the number provided for a urology appointment as soon as possible to arrange a cystoscopy. Patient is currently on Keflex, do not believe any further antibiotics are warranted at this time. Overall the patient appears very well with a nontender exam.  ____________________________________________   FINAL CLINICAL IMPRESSION(S) / ED DIAGNOSES  hematuria    Harvest Dark, MD 06/21/17 1510

## 2017-06-22 LAB — URINE CULTURE

## 2017-06-28 ENCOUNTER — Telehealth: Payer: Self-pay

## 2017-06-28 NOTE — Telephone Encounter (Signed)
LVM for patient callback to schedule repeat colonoscopy due to poor prep.

## 2017-07-01 ENCOUNTER — Encounter: Payer: Self-pay | Admitting: Gastroenterology

## 2017-07-09 ENCOUNTER — Ambulatory Visit: Payer: Self-pay | Admitting: Urology

## 2017-07-31 ENCOUNTER — Ambulatory Visit (INDEPENDENT_AMBULATORY_CARE_PROVIDER_SITE_OTHER): Payer: Medicaid Other | Admitting: Urology

## 2017-07-31 VITALS — BP 113/76 | HR 83 | Ht 60.0 in | Wt 196.0 lb

## 2017-07-31 DIAGNOSIS — R310 Gross hematuria: Secondary | ICD-10-CM

## 2017-07-31 LAB — URINALYSIS, COMPLETE
BILIRUBIN UA: NEGATIVE
GLUCOSE, UA: NEGATIVE
Ketones, UA: NEGATIVE
LEUKOCYTES UA: NEGATIVE
Nitrite, UA: NEGATIVE
PROTEIN UA: NEGATIVE
RBC, UA: NEGATIVE
Specific Gravity, UA: 1.025 (ref 1.005–1.030)
Urobilinogen, Ur: 0.2 mg/dL (ref 0.2–1.0)
pH, UA: 6 (ref 5.0–7.5)

## 2017-07-31 NOTE — Progress Notes (Signed)
07/31/2017 8:57 AM   Cindy [name] Apr 06, 1996 [id number]  Referring provider: No referring provider defined for this encounter.  Chief Complaint  Patient presents with  . Hematuria    New Patient    HPI: Consultation for gross hematuria. Patient has noted red urine since Jan 2018. She noticed this following a NSVD Jan 2018. She reports a laceration during child birth due to the baby's "shoulders" being difficult. She does report occasional small clots. She also sees blood in her pad or underwear when she is not voiding. No flank pain or dysuria. She has not followed up with gynecologist, but she did have a TV US 06/13/2017 in ED which is reported as normal. No h/o kidney stones. Patient underwent urinalysis on 2 separate occasions on 06/13/2017 and 06/21/2017 which revealed too numerous to count red blood cells per high-powered field. Of note, she has increasing proteinuria. She also reports anemia. She has sickle cell trait. Urine cultures have been mixed or little growth.   She voids with a good stream. No incontinence.   She underwent a CT scan of the abdomen and pelvis with contrast 04/15/2017 which showed a normal urinary tract. I reviewed all the images.  Modifying factors: There are no other modifying factors  Associated signs and symptoms: There are no other associated signs and symptoms Aggravating and relieving factors: There are no other aggravating or relieving factors Severity: Moderate Duration: Persistent  UA today clear.     PMH: Past Medical History:  Diagnosis Date  . Anemia     Surgical History: Past Surgical History:  Procedure Laterality Date  . COLONOSCOPY WITH PROPOFOL N/A 06/15/2017   Procedure: COLONOSCOPY WITH PROPOFOL;  Surgeon: [name], MD;  Location: [facility] ENDOSCOPY;  Service: Gastroenterology;  Laterality: N/A;  . ESOPHAGOGASTRODUODENOSCOPY (EGD) WITH PROPOFOL N/A 06/15/2017   Procedure: ESOPHAGOGASTRODUODENOSCOPY (EGD) WITH PROPOFOL;   Surgeon: [name], MD;  Location: [facility] ENDOSCOPY;  Service: Gastroenterology;  Laterality: N/A;  . TONSILLECTOMY      Home Medications:  Allergies as of 07/31/2017   No Known Allergies     Medication List       Accurate as of 07/31/17  8:57 AM. Always use your most recent med list.          dicyclomine 20 MG tablet Commonly known as:  BENTYL Take 1 tablet (20 mg total) by mouth 3 (three) times daily as needed for spasms.   ferrous sulfate 325 (65 FE) MG tablet Take 1 tablet (325 mg total) by mouth 2 (two) times daily with a meal.   omeprazole 40 MG capsule Commonly known as:  PRILOSEC Take 1 capsule (40 mg total) by mouth daily.   ondansetron 4 MG tablet Commonly known as:  ZOFRAN Take 1 tablet (4 mg total) by mouth every 8 (eight) hours as needed for nausea or vomiting.   polyethylene glycol packet Commonly known as:  MIRALAX / GLYCOLAX Take 17 g by mouth daily. Mix one tablespoon with 8oz of your favorite juice or water every day until you are having soft formed stools. Then start taking once daily if you didn't have a stool the day before.   potassium chloride SA 20 MEQ tablet Commonly known as:  KLOR-CON M20 Take 1 tablet (20 mEq total) by mouth daily.   promethazine 12.5 MG tablet Commonly known as:  PHENERGAN Take 1 tablet (12.5 mg total) by mouth every 6 (six) hours as needed for nausea or vomiting.       Allergies: No  Known Allergies  Family History: Family History  Problem Relation Age of Onset  . Thyroid disease Mother   . Hypertension Mother   . Sickle cell trait Mother   . Sickle cell anemia Sister   . Hypertension Maternal Grandmother     Social History:  reports that she has quit smoking. She has never used smokeless tobacco. She reports that she uses drugs, including Marijuana. She reports that she does not drink alcohol.  ROS:                                        Physical Exam: There were no vitals taken for  this visit.  Constitutional:  Alert and oriented, No acute distress. HEENT: [place] AT, moist mucus membranes.  Trachea midline, no masses. Cardiovascular: No clubbing, cyanosis, or edema. Respiratory: Normal respiratory effort, no increased work of breathing. GI: Abdomen is soft, nontender, nondistended, no abdominal masses GU: No CVA tenderness.  Skin: No rashes, bruises or suspicious lesions. Neurologic: Grossly intact, no focal deficits, moving all 4 extremities. Psychiatric: Normal mood and affect.  Laboratory Data: Lab Results  Component Value Date   WBC 7.5 06/21/2017   HGB 13.4 06/21/2017   HCT 38.7 06/21/2017   MCV 69.7 (L) 06/21/2017   PLT 309 06/21/2017    Lab Results  Component Value Date   CREATININE 0.55 06/21/2017    No results found for: PSA1  No results found for: TESTOSTERONE  No results found for: HGBA1C  Urinalysis Lab Results  Component Value Date   APPEARANCEUR CLOUDY (A) 06/21/2017   LEUKOCYTESUR TRACE (A) 06/21/2017   PROTEINUR 100 (A) 06/21/2017   GLUCOSEU NEGATIVE 06/21/2017   RBCU TOO NUMEROUS TO COUNT 06/21/2017   BILIRUBINUR NEGATIVE 06/21/2017   NITRITE NEGATIVE 06/21/2017    Lab Results  Component Value Date   BACTERIA NONE SEEN 06/21/2017    Pertinent Imaging: CT  No results found for this or any previous visit. No results found for this or any previous visit. No results found for this or any previous visit. No results found for this or any previous visit. No results found for this or any previous visit. No results found for this or any previous visit. No results found for this or any previous visit. No results found for this or any previous visit.  Assessment & Plan:   1. gross hematuria - CT imaging bening and UA clear today. Possible related to ssd trait (?proteinuria) or lower GU trauma from childbirth. Will screen with renal [place] and she'll return for exam and cystoscopy (we discussed the nature r/b/a to cysto).   I  encouraged her to get her GED.   - Urinalysis, Complete   No Follow-up on file.  Festus Aloe, [facility] [address] [place], [place] [address] [phone]

## 2017-08-01 ENCOUNTER — Ambulatory Visit: Payer: Medicaid Other | Admitting: Gastroenterology

## 2017-08-06 ENCOUNTER — Encounter: Payer: Self-pay | Admitting: Gastroenterology

## 2017-08-06 ENCOUNTER — Ambulatory Visit: Payer: Medicaid Other | Admitting: Gastroenterology

## 2017-08-06 NOTE — Progress Notes (Deleted)
Summary of history :  She was recently seen on 03/15/17 for abdominal pain at my office. She was seen at the ER on 02/03/17 for abdominal pain , noted to have a alrge qty of blood and protein in her urine . CT scan of the abdomen showed thickening of the distal small bowel loops, TI,cecum ,ascending colon , hepatic flexure and splenic flexure. Enlarged appearance of the appendix. Treated with cipro and flagyl for colitis . On her labs noted to have severe microcytosis of 69 and a Hb of 13.6 .The abdominal pain was going on since 12/2016 , once every few days , in the morning and stops in the evening , lower part of the abdomen , non radiating , sharp in nature , sitting up made it worse , better after a bowel movement and antibiotics , no nsaid use , noted blood in her stool. She was seen at the ER on 04/15/17 for abdominal pain  Lipase , preg test ,CMP was normal Hb 12.9 with MCV 69 . Urine analysis showed moderate blood and protein in the urine.   Interval history  05/01/2017-08/06/17    Seen by Urology 07/2017 for hematuria and awaiting tests  04/2017-Low ferritin- suggested to commence on iron  05/2017 - Hb 13.1 with MCV 70  05/2017- EGD with duodenal biopsies were negative. Colonoscopy incomplete due to large qty of stool in the colon .     I had ordered iron studies , stool studies, urine studies which were not obtained.   CT scan of the abdomen was performed on 04/15/17 which showed a moderate stool burden otherwise normal.    Denies any NSAID use. Presently has a bowel movement daily and soft . She says she has seen blood in her urine. On and off .   She has stopped taking Prilosec cant be sure.     Cindy [name] is a 21 y.o. y/o female  here to follow up  for abdominal pain. Imaging in 01/2017  suggested inflammation of the colon and small bowel(resolved on repeat scan on 04/2017 ) , in addition she has microcytosis, history of rectal bleeding and feels better when she is on antibiotics. I  suggest she pursues evaluation for  IBD-crohns disease Did not follow up with recommendations at last visit. Stressed on the importance of doing so   Impression  1. Abdominal pain with history of colitis/ileitis 2.  Needs a PCP   Plan  1. colonoscopy for abdominal pain , evaluation of colitis, ileitis , rectal bleeding  3. Stool H pylori antigen and fecal lactoferrin  4. Set up PCP 5. Continue PPI 6. High fiber diet , Miralax PRN.

## 2017-08-14 ENCOUNTER — Other Ambulatory Visit: Payer: Medicaid Other

## 2017-08-15 ENCOUNTER — Ambulatory Visit: Payer: Medicaid Other

## 2017-08-20 ENCOUNTER — Ambulatory Visit
Admission: RE | Admit: 2017-08-20 | Discharge: 2017-08-20 | Disposition: A | Discharge status: Home / Self Care | Payer: Medicaid Other | Source: Ambulatory Visit | Attending: Urology | Admitting: Urology

## 2017-08-20 DIAGNOSIS — R310 Gross hematuria: Secondary | ICD-10-CM | POA: Diagnosis not present

## 2017-08-22 ENCOUNTER — Telehealth: Payer: Self-pay

## 2017-08-22 NOTE — Telephone Encounter (Signed)
Festus Aloe, MD  Lestine Box, LPN        [place] normal -- f/u as planned for exam/cystoscopy.    lmom

## 2017-08-24 NOTE — Telephone Encounter (Signed)
Patient returned call and was notified of her normal [place] results. She will follow up as scheduled for a cysto.

## 2017-09-03 ENCOUNTER — Encounter: Payer: Self-pay | Admitting: Urology

## 2017-09-03 ENCOUNTER — Ambulatory Visit (INDEPENDENT_AMBULATORY_CARE_PROVIDER_SITE_OTHER): Payer: Medicaid Other | Admitting: Urology

## 2017-09-03 VITALS — BP 116/74 | HR 84 | Ht 60.0 in | Wt 199.1 lb

## 2017-09-03 DIAGNOSIS — R310 Gross hematuria: Secondary | ICD-10-CM

## 2017-09-03 DIAGNOSIS — N939 Abnormal uterine and vaginal bleeding, unspecified: Secondary | ICD-10-CM

## 2017-09-03 LAB — URINALYSIS, COMPLETE
BILIRUBIN UA: NEGATIVE
GLUCOSE, UA: NEGATIVE
KETONES UA: NEGATIVE
Leukocytes, UA: NEGATIVE
NITRITE UA: POSITIVE — AB
PH UA: 7 (ref 5.0–7.5)
Specific Gravity, UA: 1.025 (ref 1.005–1.030)
UUROB: 0.2 mg/dL (ref 0.2–1.0)

## 2017-09-03 LAB — MICROSCOPIC EXAMINATION: WBC, UA: NONE SEEN /hpf (ref 0–?)

## 2017-09-03 MED ORDER — CIPROFLOXACIN HCL 500 MG PO TABS
500.0000 mg | ORAL_TABLET | Freq: Once | ORAL | Status: AC
  Administered 2017-09-03: 500 mg via ORAL

## 2017-09-03 MED ORDER — LIDOCAINE HCL 2 % EX GEL
1.0000 "application " | Freq: Once | CUTANEOUS | Status: AC
  Administered 2017-09-03: 1 via URETHRAL

## 2017-09-03 NOTE — Progress Notes (Signed)
   09/03/17  CC:  Chief Complaint  Patient presents with  . Cysto    HPI:  F/ u - gross hematuria. Patient noted red urine since Jan 2018. She noticed this following a NSVD Jan 2018. She reports a laceration during child birth due to the baby's "shoulders" being difficult. She does report occasional small clots. She also sees blood in her pad or underwear when she is not voiding. No flank pain or dysuria. She underwent a CT scan of the abdomen and pelvis with contrast 04/15/2017 which showed a normal urinary tract. She has not followed up with gynecologist, but she did have a TV US 06/13/2017 in ED which is reported as normal. No h/o kidney stones. Patient underwent urinalysis on 2 separate occasions on 06/13/2017 and 06/21/2017 which revealed too numerous to count red blood cells per high-powered field. Of note, she has increasing proteinuria. She also reports anemia. She has sickle cell trait. Urine cultures have been mixed or little growth. She voids with a good stream. No incontinence.    She returns today for exam and cystoscopy. She has no dysuria. Continues to see blood per vagina and when she voids. Not sure if she's having regular periods.    Blood pressure 116/74, pulse 84, height 5' (1.524 m), weight 90.3 kg (199 lb 1.6 oz) Chaperone - for exam and cysto [facility] NED. A&Ox3.   No respiratory distress   Abd soft, NT, ND Normal external genitalia with patent urethral meatus There is visible blood in vaginal vault (reddish brown discharge, no clots or arterial blood) Bladder and urethra palpably normal.   Cystoscopy Procedure Note  Patient identification was confirmed, informed consent was obtained, and patient was prepped using Betadine solution.  Lidocaine jelly was administered per urethral meatus.    Preoperative abx where received prior to procedure.    Procedure: - Flexible cystoscope introduced, without any difficulty.   - Thorough search of the bladder revealed:  normal urethral meatus    normal urothelium    no stones    no ulcers     no tumors    no urethral polyps    no trabeculation    Normal retroflex view  - Ureteral orifices were normal in position and appearance.  Post-Procedure: - Patient tolerated the procedure well  Assessment/ Plan:  Hematuria -  This is likely from urine washing blood out of the vaginal vault when she voids. No sign of urinary bleeding on imaging, exam or cystoscopy. We discussed the presence of vagina bleeding and I recommended she f/u with her OB/Gyn or PCP for referral. We discussed that vaginal bleeding is not normal and the importance of f/u.     Festus Aloe, MD

## 2017-12-02 IMAGING — CT CT ABD-PELV W/ CM
2 of 4 series · 16 of 46 positions shown, 18 images · IV contrast (APPLIED)
Comparison: 02/03/2017

CLINICAL DATA: Right lower quadrant pain.

EXAM:
CT ABDOMEN AND PELVIS WITH CONTRAST
TECHNIQUE: Multidetector CT imaging of the abdomen and pelvis was performed
using the standard protocol following bolus administration of
intravenous contrast.
CONTRAST:  100mL JFPW55-LYY IOPAMIDOL (JFPW55-LYY) INJECTION 61%

[Series 2: routine abd/pel with · axial · 0.67mm/px · z∈[-904,-490]mm · 13 of 91 slices shown, 15 images]
[im 4/91  soft-tissue]
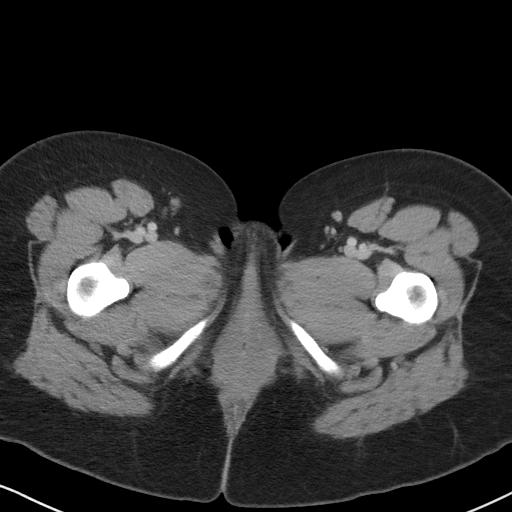
[im 4/91  bone]
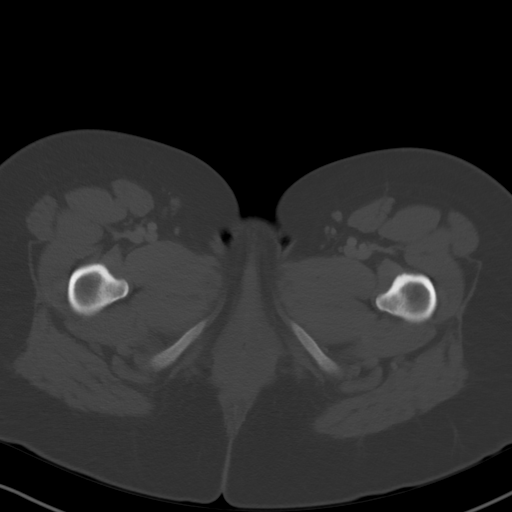
[im 12/91  soft-tissue]
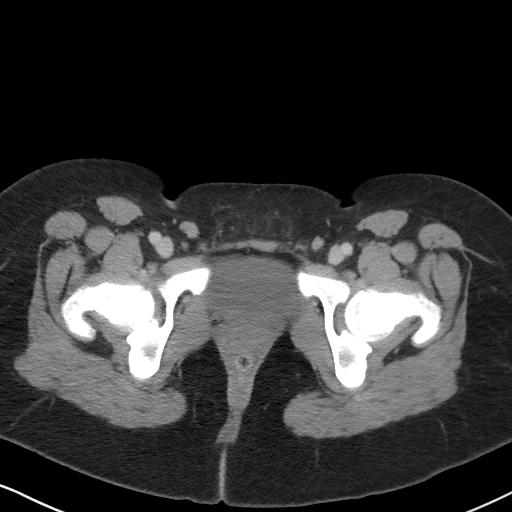
[im 19/91  soft-tissue]
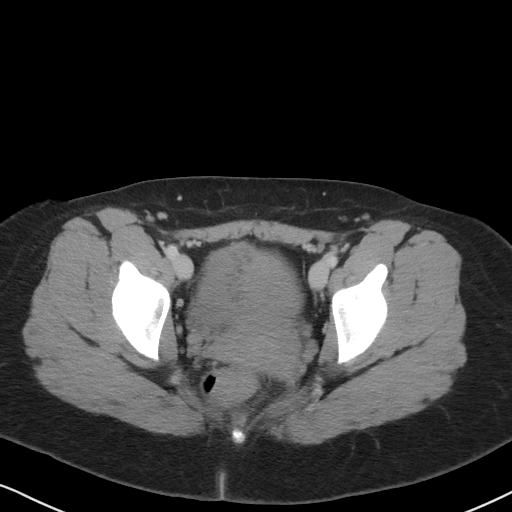
[im 27/91  soft-tissue]
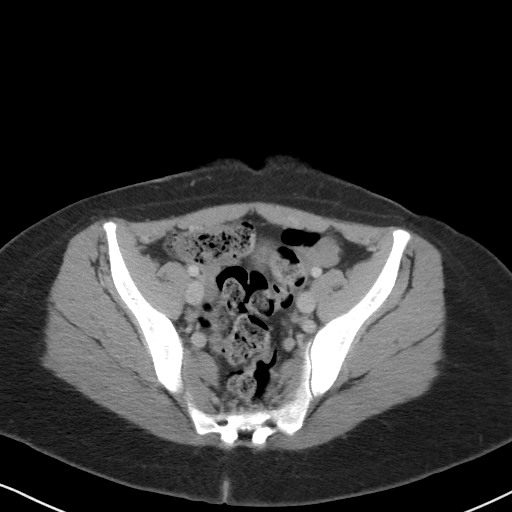
[im 31/91  soft-tissue]
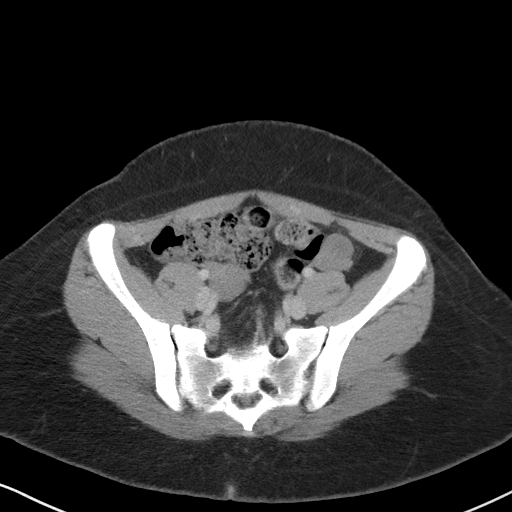
[im 38/91  soft-tissue]
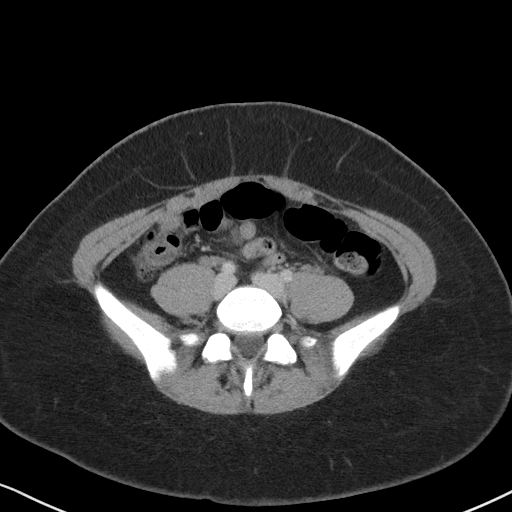
[im 46/91  soft-tissue]
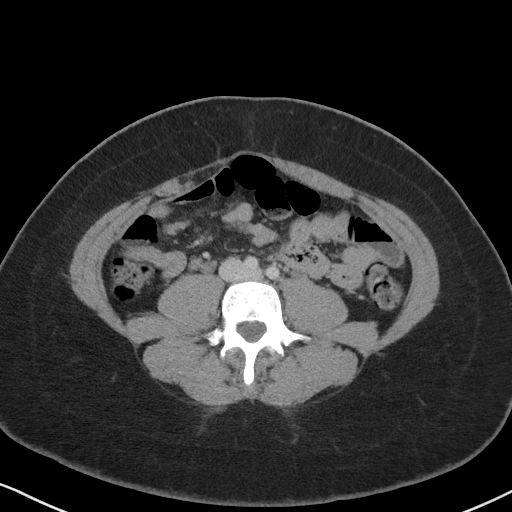
[im 53/91  soft-tissue]
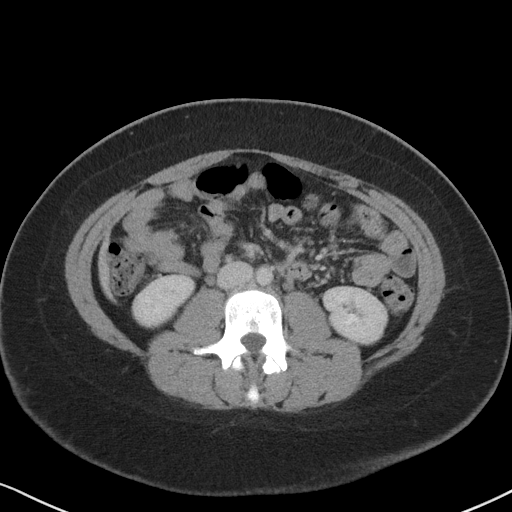
[im 61/91  soft-tissue]
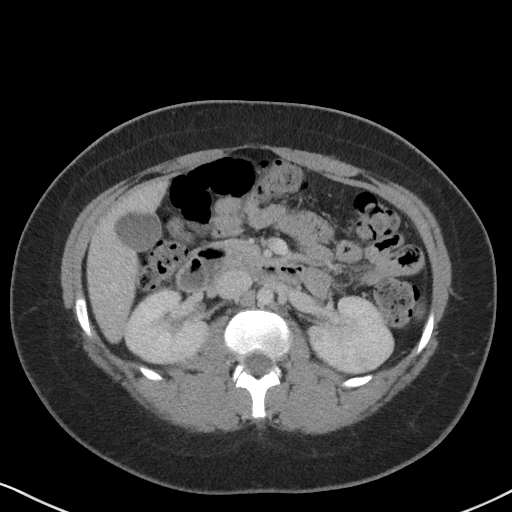
[im 61/91  bone]
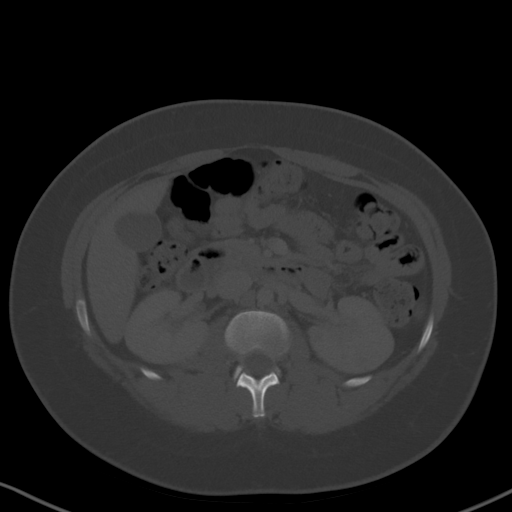
[im 64/91  soft-tissue]
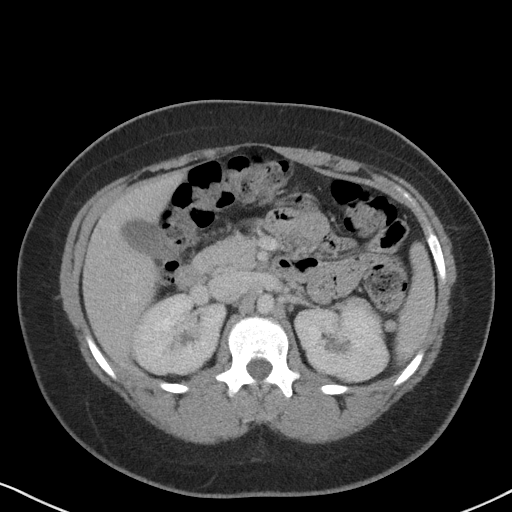
[im 72/91  soft-tissue]
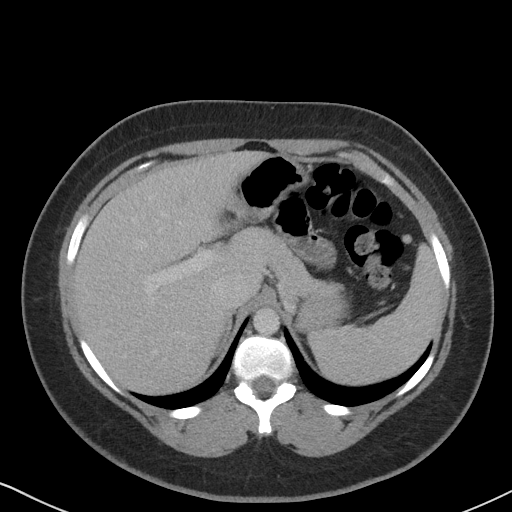
[im 79/91  soft-tissue]
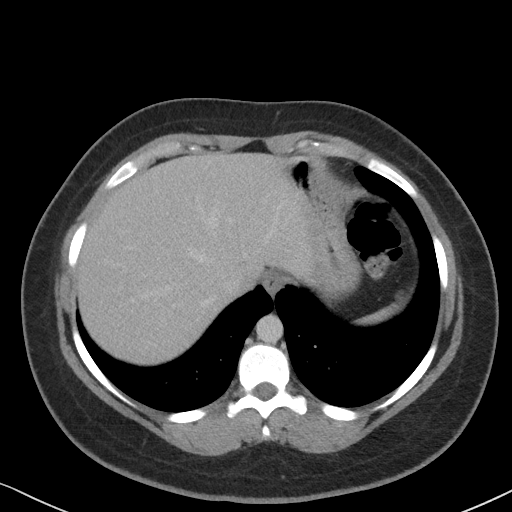
[im 87/91  soft-tissue]
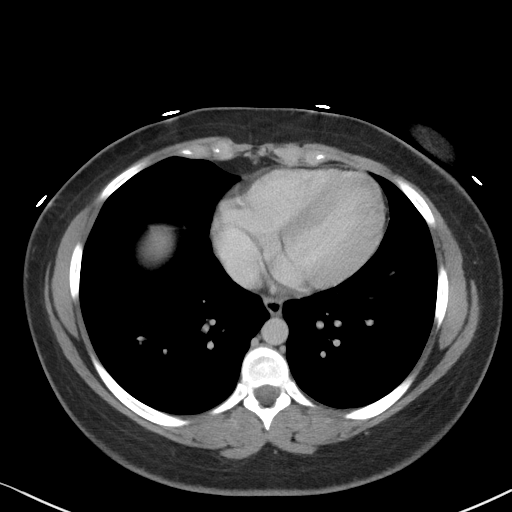

[Series 5: coronal st · coronal · 0.60mm/px · 3 of 85 slices shown]
[im 29/85  soft-tissue]
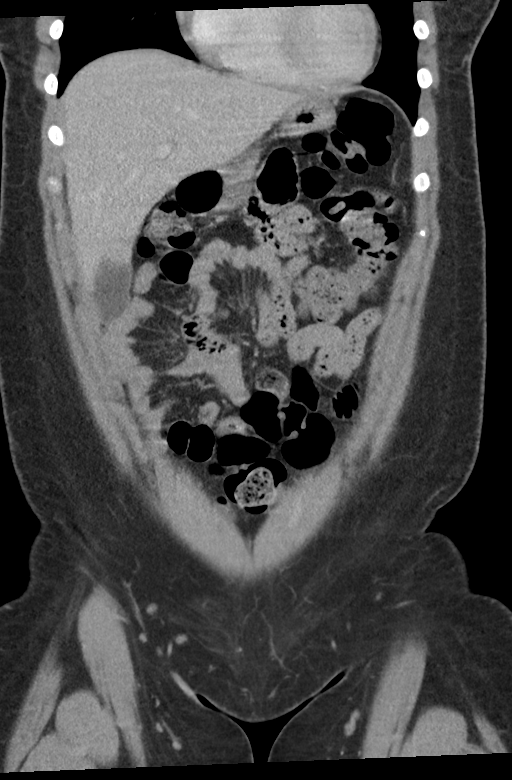
[im 38/85  soft-tissue]
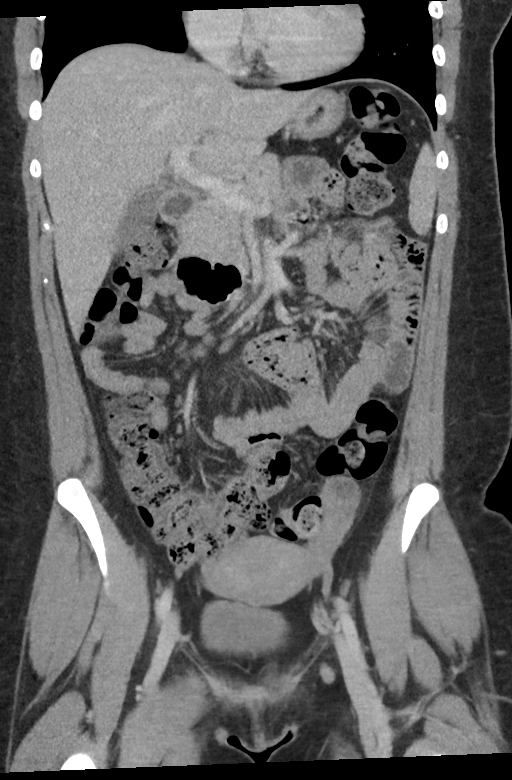
[im 47/85  soft-tissue]
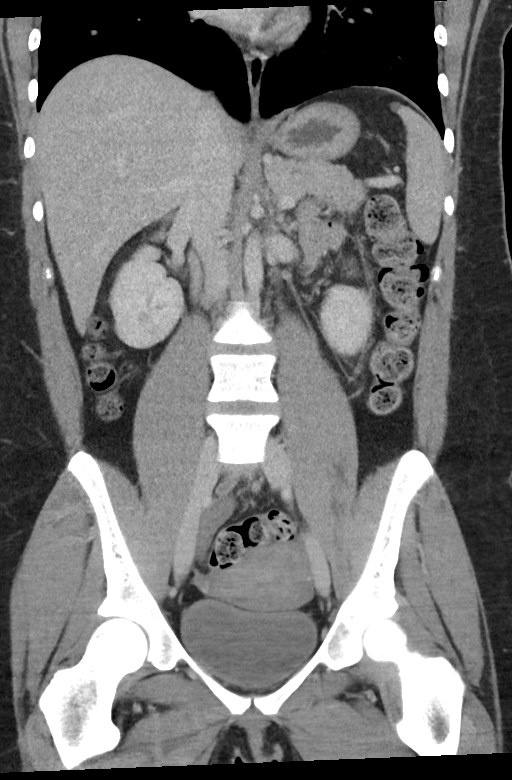

[16 of 46 positions shown; findings below may reference images not displayed]

FINDINGS: Lower chest: Lung bases are clear. No effusions. Heart is normal
size.

Hepatobiliary: No focal hepatic abnormality. Gallbladder
unremarkable.

Pancreas: No focal abnormality or ductal dilatation.

Spleen: No focal abnormality.  Normal size.

Adrenals/Urinary Tract: No adrenal abnormality. No focal renal
abnormality. No stones or hydronephrosis. Urinary bladder is
unremarkable.

Stomach/Bowel: Normal appendix. Moderate stool burden throughout the
colon. Stomach, large and small bowel grossly unremarkable.

Vascular/Lymphatic: No evidence of aneurysm or adenopathy.

Reproductive: Uterus and adnexa unremarkable.  No mass.

Other: No free fluid or free air.

Musculoskeletal: No acute bony abnormality.
IMPRESSION: No acute findings in the abdomen or pelvis.

Moderate stool burden.

## 2018-01-31 ENCOUNTER — Other Ambulatory Visit: Payer: Self-pay

## 2018-01-31 ENCOUNTER — Emergency Department
Admission: EM | Admit: 2018-01-31 | Discharge: 2018-01-31 | Disposition: A | Discharge status: Home / Self Care | Payer: Medicaid Other | Attending: Emergency Medicine | Admitting: Emergency Medicine

## 2018-01-31 ENCOUNTER — Encounter: Payer: Self-pay | Admitting: Emergency Medicine

## 2018-01-31 DIAGNOSIS — Z79899 Other long term (current) drug therapy: Secondary | ICD-10-CM | POA: Diagnosis not present

## 2018-01-31 DIAGNOSIS — Z87891 Personal history of nicotine dependence: Secondary | ICD-10-CM | POA: Insufficient documentation

## 2018-01-31 DIAGNOSIS — N946 Dysmenorrhea, unspecified: Secondary | ICD-10-CM | POA: Insufficient documentation

## 2018-01-31 DIAGNOSIS — R102 Pelvic and perineal pain: Secondary | ICD-10-CM | POA: Diagnosis present

## 2018-01-31 DIAGNOSIS — R109 Unspecified abdominal pain: Secondary | ICD-10-CM

## 2018-01-31 LAB — CBC
HCT: 39.1 % (ref 35.0–47.0)
Hemoglobin: 12.9 g/dL (ref 12.0–16.0)
MCH: 23.2 pg — ABNORMAL LOW (ref 26.0–34.0)
MCHC: 33 g/dL (ref 32.0–36.0)
MCV: 70.4 fL — ABNORMAL LOW (ref 80.0–100.0)
Platelets: 288 10*3/uL (ref 150–440)
RBC: 5.56 MIL/uL — ABNORMAL HIGH (ref 3.80–5.20)
RDW: 15.5 % — ABNORMAL HIGH (ref 11.5–14.5)
WBC: 16.4 10*3/uL — ABNORMAL HIGH (ref 3.6–11.0)

## 2018-01-31 LAB — URINALYSIS, COMPLETE (UACMP) WITH MICROSCOPIC
Bacteria, UA: NONE SEEN
Bilirubin Urine: NEGATIVE
Glucose, UA: NEGATIVE mg/dL
Ketones, ur: NEGATIVE mg/dL
Nitrite: NEGATIVE
Protein, ur: 30 mg/dL — AB
Specific Gravity, Urine: 1.024 (ref 1.005–1.030)
pH: 8 (ref 5.0–8.0)

## 2018-01-31 LAB — COMPREHENSIVE METABOLIC PANEL
ALT: 37 U/L (ref 14–54)
ANION GAP: 8 (ref 5–15)
AST: 28 U/L (ref 15–41)
Albumin: 4.1 g/dL (ref 3.5–5.0)
Alkaline Phosphatase: 68 U/L (ref 38–126)
BUN: 7 mg/dL (ref 6–20)
CHLORIDE: 106 mmol/L (ref 101–111)
CO2: 23 mmol/L (ref 22–32)
CREATININE: 0.48 mg/dL (ref 0.44–1.00)
Calcium: 9.1 mg/dL (ref 8.9–10.3)
Glucose, Bld: 86 mg/dL (ref 65–99)
POTASSIUM: 3.3 mmol/L — AB (ref 3.5–5.1)
SODIUM: 137 mmol/L (ref 135–145)
Total Bilirubin: 0.5 mg/dL (ref 0.3–1.2)
Total Protein: 7.7 g/dL (ref 6.5–8.1)

## 2018-01-31 LAB — CHLAMYDIA/NGC RT PCR (ARMC ONLY)
Chlamydia Tr: NOT DETECTED
N gonorrhoeae: NOT DETECTED

## 2018-01-31 LAB — WET PREP, GENITAL
CLUE CELLS WET PREP: NONE SEEN
SPERM: NONE SEEN
TRICH WET PREP: NONE SEEN
Yeast Wet Prep HPF POC: NONE SEEN

## 2018-01-31 LAB — LIPASE, BLOOD: Lipase: 23 U/L (ref 11–51)

## 2018-01-31 LAB — POCT PREGNANCY, URINE: Preg Test, Ur: NEGATIVE

## 2018-01-31 MED ORDER — OXYCODONE-ACETAMINOPHEN 5-325 MG PO TABS
ORAL_TABLET | ORAL | Status: AC
  Filled 2018-01-31: qty 1

## 2018-01-31 MED ORDER — ONDANSETRON 4 MG PO TBDP
ORAL_TABLET | ORAL | Status: AC
  Filled 2018-01-31: qty 1

## 2018-01-31 MED ORDER — OXYCODONE-ACETAMINOPHEN 5-325 MG PO TABS
1.0000 | ORAL_TABLET | Freq: Once | ORAL | Status: AC
  Administered 2018-01-31: 1 via ORAL

## 2018-01-31 MED ORDER — ONDANSETRON 4 MG PO TBDP
4.0000 mg | ORAL_TABLET | Freq: Once | ORAL | Status: AC
  Administered 2018-01-31: 4 mg via ORAL

## 2018-01-31 NOTE — Discharge Instructions (Signed)
You  would prefer not to stay for an ultrasound or CT scan which is not unreasonable but if you have increased pain, fever, vomiting, or you feel worse in any way please return to the emergency department.  Follow-up closely with primary care doctor and OB/GYN

## 2018-01-31 NOTE — ED Triage Notes (Signed)
Patient ambulatory to triage with steady gait, without difficulty or distress noted; pt reports lower abd pain since last night accomp by N/V

## 2018-01-31 NOTE — ED Provider Notes (Addendum)
[facility] Emergency Department Provider Note  ____________________________________________   I have reviewed the triage vital signs and the nursing notes. Where available I have reviewed prior notes and, if possible and indicated, outside hospital notes.    HISTORY  Chief Complaint Abdominal Pain    HPI Cindy [name] is a 22 y.o. female chronic abdominal pain .  States that she started having cramping suprapubic pain.  She does have irregular periods.  She states it feels similar to it.  But she was not really aware of that, until she got here and started having some spotting.  She denies any fever or chills.  Patient states this is similar to some times when she gets painful cramping discomfort with her menstrual period.  She denies any fever or chills.  The pain is in the suprapubic region.  Began last night which is usual for her to have cramping before her menstrual period starts.  Patient has had in the last year CT scans and ultrasounds of her abdomen pelvis for similar kinds of pain.  She has followed up with GI but they have not been able to help her find out why she has recurrent discomfort.  She cannot tell me if it is always with her menstrual period or not but certainly this time it feels as if it is a menstrual period coming on.  She does not recall the last time she had her period, she states she just does not keep track of that.  In addition, she states that she has had no vaginal discharge denies STI exposure.    Past Medical History:  Diagnosis Date  . Anemia     Patient Active Problem List   Diagnosis Date Noted  . Postpartum care following vaginal delivery 10/24/2016    Past Surgical History:  Procedure Laterality Date  . COLONOSCOPY WITH PROPOFOL N/A 06/15/2017   Procedure: COLONOSCOPY WITH PROPOFOL;  Surgeon: [name], MD;  Location: [facility] ENDOSCOPY;  Service: Gastroenterology;  Laterality: N/A;  . ESOPHAGOGASTRODUODENOSCOPY (EGD)  WITH PROPOFOL N/A 06/15/2017   Procedure: ESOPHAGOGASTRODUODENOSCOPY (EGD) WITH PROPOFOL;  Surgeon: [name], MD;  Location: [facility] ENDOSCOPY;  Service: Gastroenterology;  Laterality: N/A;  . TONSILLECTOMY      Prior to Admission medications   Medication Sig Start Date End Date Taking? Authorizing Provider  dicyclomine (BENTYL) 20 MG tablet Take 1 tablet (20 mg total) by mouth 3 (three) times daily as needed for spasms. 04/15/17 04/15/18  Merlyn Lot, MD  ferrous sulfate 325 (65 FE) MG tablet Take 1 tablet (325 mg total) by mouth 2 (two) times daily with a meal. 05/08/17 08/06/17  [name], MD  ibuprofen (ADVIL,MOTRIN) 800 MG tablet Take 800 mg by mouth. 06/23/17   [provider]  omeprazole (PRILOSEC) 40 MG capsule Take 1 capsule (40 mg total) by mouth daily. 03/15/17   [name], MD  ondansetron (ZOFRAN) 4 MG tablet Take 1 tablet (4 mg total) by mouth every 8 (eight) hours as needed for nausea or vomiting. 06/13/17 06/13/18  Arta Silence, MD  polyethylene glycol (MIRALAX / Floria Raveling) packet Take 17 g by mouth daily. Mix one tablespoon with 8oz of your favorite juice or water every day until you are having soft formed stools. Then start taking once daily if you didn't have a stool the day before. Patient not taking: Reported on 05/01/2017 04/15/17   Merlyn Lot, MD  potassium chloride SA (KLOR-CON M20) 20 MEQ tablet Take 1 tablet (20 mEq total) by mouth daily.  02/03/17   Hinda Kehr, MD  promethazine (PHENERGAN) 12.5 MG tablet Take 1 tablet (12.5 mg total) by mouth every 6 (six) hours as needed for nausea or vomiting. 04/15/17   Merlyn Lot, MD    Allergies Patient has no known allergies.  Family History  Problem Relation Age of Onset  . Thyroid disease Mother   . Hypertension Mother   . Sickle cell trait Mother   . Sickle cell anemia Sister   . Hypertension Maternal Grandmother     Social History Social History   Tobacco Use  . Smoking status: Former  Research scientist (life sciences)  . Smokeless tobacco: Never Used  Substance Use Topics  . Alcohol use: No    Comment: former  . Drug use: Yes    Types: Marijuana    Comment: last used in sept    Review of Systems Constitutional: No fever/chills Eyes: No visual changes. ENT: No sore throat. No stiff neck no neck pain Cardiovascular: Denies chest pain. Respiratory: Denies shortness of breath. Gastrointestinal: "Dry heaves a little bit from the pain".  No diarrhea.  No constipation. Genitourinary: Negative for dysuria. Musculoskeletal: Negative lower extremity swelling Skin: Negative for rash. Neurological: Negative for severe headaches, focal weakness or numbness.   ____________________________________________   PHYSICAL EXAM:  VITAL SIGNS: ED Triage Vitals [01/31/18 2054]  Enc Vitals Group     BP      Pulse      Resp      Temp      Temp src      SpO2      Weight 215 lb (97.5 kg)     Height 5' (1.524 m)     Head Circumference      Peak Flow      Pain Score 10     Pain Loc      Pain Edu?      Excl. in [place]?     Constitutional: Alert and oriented. Well appearing and in no acute distress. Eyes: Conjunctivae are normal Head: Atraumatic HEENT: No congestion/rhinnorhea. Mucous membranes are moist.  Oropharynx non-erythematous Neck:   Nontender with no meningismus, no masses, no stridor Cardiovascular: Normal rate, regular rhythm. Grossly normal heart sounds.  Good peripheral circulation. Respiratory: Normal respiratory effort.  No retractions. Lungs CTAB. Abdominal: Soft and nontender. No distention. No guarding no rebound Back:  There is no focal tenderness or step off.  there is no midline tenderness there are no lesions noted. there is no CVA tenderness Pelvic exam: Female nurse chaperone present, no external lesions noted, physiologic vaginal discharge noted with no purulent discharge, no cervical motion tenderness, no adnexal tenderness or mass, there is mild uterine tenderness reproduces  her pain.  Mild early menstrual period vaginal bleeding Musculoskeletal: No lower extremity tenderness, no upper extremity tenderness. No joint effusions, no DVT signs strong distal pulses no edema Neurologic:  Normal speech and language. No gross focal neurologic deficits are appreciated.  Skin:  Skin is warm, dry and intact. No rash noted. Psychiatric: Mood and affect are normal. Speech and behavior are normal.  ____________________________________________   LABS (all labs ordered are listed, but only abnormal results are displayed)  Labs Reviewed  URINALYSIS, COMPLETE (UACMP) WITH MICROSCOPIC - Abnormal; Notable for the following components:      Result Value   Color, Urine YELLOW (*)    APPearance CLOUDY (*)    Hgb urine dipstick LARGE (*)    Protein, ur 30 (*)    Leukocytes, UA TRACE (*)  Squamous Epithelial / LPF 6-30 (*)    All other components within normal limits  CHLAMYDIA/NGC RT PCR ([facility] ONLY)  WET PREP, GENITAL  LIPASE, BLOOD  COMPREHENSIVE METABOLIC PANEL  CBC  POC URINE PREG, ED  POCT PREGNANCY, URINE    Pertinent labs  results that were available during my care of the patient were reviewed by me and considered in my medical decision making (see chart for details). ____________________________________________  EKG  I personally interpreted any EKGs ordered by me or triage  ____________________________________________  RADIOLOGY  Pertinent labs & imaging results that were available during my care of the patient were reviewed by me and considered in my medical decision making (see chart for details). If possible, patient and/or family made aware of any abnormal findings.  No results found. ____________________________________________    PROCEDURES  Procedure(s) performed: None  Procedures  Critical Care performed: None  ____________________________________________   INITIAL IMPRESSION / ASSESSMENT AND PLAN / ED COURSE  Pertinent  labs & imaging results that were available during my care of the patient were reviewed by me and considered in my medical decision making (see chart for details).  Patient with chronic recurrent pelvic pain seen and worked up extensively multiple times in the past with CT scans and ultrasounds etc. presents today with suprapubic cramping and what turns out to be her menstrual period.  She has no focal right lower quadrant or left lower quadrant pain no adnexal masses or tenderness noted, patient feels that this is the exact same pain she always has with her menstrual.  She states that it has been a while since she had her period and she was not expecting it.  She has a very poor IV access, we did attempt to get blood work were unable to.  Patient does not want to be stuck again, she is been stuck several times.  She denies any dysuria, there is some blood in her urine but otherwise is unremarkable, she does not want a cath urine we will send a urine culture as it clearly was not a good clean-catch.  She is not driving home we did give her pain medication here.  Given chronic recurrent nature of the pain, obvious association with the onset of her menses, no evidence of adnexal tenderness at this time, patient's reluctance to stay for further evaluation, multiple different visits with extensive imaging the last 8 months for similar, we will see if we can get her home with outpatient follow-up and return precautions given and understood. Considering the patient's symptoms, medical history, and physical examination today, I have low suspicion for cholecystitis or biliary pathology, pancreatitis, perforation or bowel obstruction, hernia, intra-abdominal abscess, AAA or dissection, volvulus or intussusception, mesenteric ischemia, ischemic gut, pyelonephritis or appendicitis PID, ovarian torsion, TOA etc.  Nor she pregnant.  ----------------------------------------- 10:52 PM on  01/31/2018 -----------------------------------------  White count is somewhat elevated has been before, abdomen is completely benign at this time after 1 Percocet, there is no tenderness or complaint of pain, I did discuss with the patient keeping her here for image, including ultrasound to rule out ovarian cyst or torsion or CT scan, I have very low suspicion for either 1 of those entities obviously.  Patient declines to stay.  She states she feels fine and will come back if she feels worse.  I do not think this is unreasonable but she does understand limitations placed upon the workup by her declining.  Patient does have a ride home, we will send  her home with close outpatient follow-up return precautions given and understood.   ____________________________________________   FINAL CLINICAL IMPRESSION(S) / ED DIAGNOSES  Final diagnoses:  None      This chart was dictated using voice recognition software.  Despite best efforts to proofread,  errors can occur which can change meaning.      Schuyler Amor, MD 01/31/18 2145    Schuyler Amor, MD 01/31/18 2253

## 2018-02-02 LAB — URINE CULTURE

## 2018-10-16 NOTE — L&D Delivery Note (Signed)
Delivery Note Primary OB: [facility] Delivery Provider: Avel Sensor, CNM Gestational Age: Full term Antepartum complications: anemia and breech with successful version, GBS positive Intrapartum complications: None  A viable Female was delivered via vertex presentation, ROA, at 0140 am. No nuchal cord was present. Delivery of the shoulders and body followed without difficulty. The infant was placed on the maternal abdomen.  The umbilical cord was doubly clamped and cut following delayed cord clamping. The placenta was delivered spontaneously and was inspected and found to be intact with a three vessel cord. The cervix and vagina were inspected. There were no lacerations. The fundus was firm. Patient and infant were bonding in stable condition. All counts were correct.  Apgars: 8, 9  Weight:  6 lb 10.9 oz.    Anesthesia:  epidural Episiotomy:  none Lacerations:  none Suture Repair: none Est. Blood Loss (mL):  800  Mom to postpartum.  Baby to Couplet care / Skin to Skin.  Avel Sensor, CNM [facility] 06/05/2019  5:52 AM

## 2018-12-10 LAB — OB RESULTS CONSOLE TSH
TSH: 1.1
TSH: 1.1

## 2018-12-10 LAB — OB RESULTS CONSOLE HIV ANTIBODY (ROUTINE TESTING)
HIV: NONREACTIVE
HIV: NONREACTIVE

## 2018-12-10 LAB — OB RESULTS CONSOLE RUBELLA ANTIBODY, IGM
Rubella: IMMUNE
Rubella: IMMUNE

## 2018-12-10 LAB — OB RESULTS CONSOLE PLATELET COUNT
Platelets: 376
Platelets: 376

## 2018-12-10 LAB — OB RESULTS CONSOLE VARICELLA ZOSTER ANTIBODY, IGG: Varicella: IMMUNE

## 2018-12-10 LAB — OB RESULTS CONSOLE HEPATITIS B SURFACE ANTIGEN
Hepatitis B Surface Ag: NEGATIVE
Hepatitis B Surface Ag: NEGATIVE
Hepatitis B Surface Ag: NEGATIVE

## 2018-12-10 LAB — OB RESULTS CONSOLE RPR
RPR: NONREACTIVE
RPR: NONREACTIVE
RPR: NONREACTIVE

## 2018-12-10 LAB — OB RESULTS CONSOLE GC/CHLAMYDIA
Chlamydia: NEGATIVE
Chlamydia: NEGATIVE
Gonorrhea: NEGATIVE
Gonorrhea: NEGATIVE

## 2018-12-10 LAB — OB RESULTS CONSOLE HGB/HCT, BLOOD
Hemoglobin: 11.6
Hemoglobin: 11.9

## 2018-12-11 LAB — OB RESULTS CONSOLE HIV ANTIBODY (ROUTINE TESTING): HIV: NONREACTIVE

## 2018-12-12 ENCOUNTER — Other Ambulatory Visit: Payer: Self-pay

## 2018-12-12 ENCOUNTER — Emergency Department
Admission: EM | Admit: 2018-12-12 | Discharge: 2018-12-12 | Disposition: A | Discharge status: Home / Self Care | Payer: Medicaid Other | Attending: Emergency Medicine | Admitting: Emergency Medicine

## 2018-12-12 ENCOUNTER — Emergency Department: Payer: Medicaid Other

## 2018-12-12 ENCOUNTER — Encounter: Payer: Self-pay | Admitting: Emergency Medicine

## 2018-12-12 DIAGNOSIS — Z3492 Encounter for supervision of normal pregnancy, unspecified, second trimester: Secondary | ICD-10-CM

## 2018-12-12 DIAGNOSIS — Z87891 Personal history of nicotine dependence: Secondary | ICD-10-CM | POA: Insufficient documentation

## 2018-12-12 DIAGNOSIS — R102 Pelvic and perineal pain: Secondary | ICD-10-CM

## 2018-12-12 DIAGNOSIS — Z3A14 14 weeks gestation of pregnancy: Secondary | ICD-10-CM | POA: Diagnosis not present

## 2018-12-12 DIAGNOSIS — O9989 Other specified diseases and conditions complicating pregnancy, childbirth and the puerperium: Secondary | ICD-10-CM | POA: Diagnosis not present

## 2018-12-12 DIAGNOSIS — R109 Unspecified abdominal pain: Secondary | ICD-10-CM | POA: Diagnosis present

## 2018-12-12 DIAGNOSIS — N949 Unspecified condition associated with female genital organs and menstrual cycle: Secondary | ICD-10-CM

## 2018-12-12 LAB — COMPREHENSIVE METABOLIC PANEL
ALK PHOS: 55 U/L (ref 38–126)
ALT: 15 U/L (ref 0–44)
ANION GAP: 9 (ref 5–15)
AST: 15 U/L (ref 15–41)
Albumin: 3.6 g/dL (ref 3.5–5.0)
BUN: 5 mg/dL — ABNORMAL LOW (ref 6–20)
CALCIUM: 9.4 mg/dL (ref 8.9–10.3)
CO2: 21 mmol/L — ABNORMAL LOW (ref 22–32)
Chloride: 107 mmol/L (ref 98–111)
Creatinine, Ser: 0.43 mg/dL — ABNORMAL LOW (ref 0.44–1.00)
GFR calc Af Amer: >60 mL/min (ref >60)
GFR calc non Af Amer: >60 mL/min (ref >60)
Glucose, Bld: 93 mg/dL (ref 70–99)
Potassium: 3.5 mmol/L (ref 3.5–5.1)
Sodium: 137 mmol/L (ref 135–145)
Total Bilirubin: 0.5 mg/dL (ref 0.3–1.2)
Total Protein: 7.6 g/dL (ref 6.5–8.1)

## 2018-12-12 LAB — CBC
HCT: 35.3 % — ABNORMAL LOW (ref 36.0–46.0)
Hemoglobin: 12 g/dL (ref 12.0–15.0)
MCH: 22.8 pg — ABNORMAL LOW (ref 26.0–34.0)
MCHC: 34 g/dL (ref 30.0–36.0)
MCV: 67 fL — ABNORMAL LOW (ref 80.0–100.0)
Platelets: 330 10*3/uL (ref 150–400)
RBC: 5.27 MIL/uL — ABNORMAL HIGH (ref 3.87–5.11)
RDW: 16.5 % — ABNORMAL HIGH (ref 11.5–15.5)
WBC: 10.2 10*3/uL (ref 4.0–10.5)
nRBC: 0 % (ref 0.0–0.2)

## 2018-12-12 LAB — URINALYSIS, COMPLETE (UACMP) WITH MICROSCOPIC
BILIRUBIN URINE: NEGATIVE
Glucose, UA: NEGATIVE mg/dL
Hgb urine dipstick: NEGATIVE
Ketones, ur: NEGATIVE mg/dL
Nitrite: NEGATIVE
Protein, ur: NEGATIVE mg/dL
Specific Gravity, Urine: 1.017 (ref 1.005–1.030)
pH: 7 (ref 5.0–8.0)

## 2018-12-12 LAB — HCG, QUANTITATIVE, PREGNANCY: hCG, Beta Chain, Quant, S: 107160 m[IU]/mL — ABNORMAL HIGH (ref <5)

## 2018-12-12 LAB — POCT PREGNANCY, URINE: Preg Test, Ur: POSITIVE — AB

## 2018-12-12 MED ORDER — ACETAMINOPHEN 500 MG PO TABS
1000.0000 mg | ORAL_TABLET | Freq: Once | ORAL | Status: AC
  Administered 2018-12-12: 1000 mg via ORAL
  Filled 2018-12-12: qty 2

## 2018-12-12 NOTE — ED Notes (Signed)
Ultrasound contacted regarding ultrasound results- tech states that [place] is resulted, results not populating in epic. States she will call and get it fixed

## 2018-12-12 NOTE — Discharge Instructions (Addendum)
Your labs and ultrasound today were all okay.  The ultrasound shows a live gestation in the uterus, heart rate of 150, estimated 13 weeks 6 days gestational age.  Your pregnancy hormone level (beta-hCG) is 107,000 today.  Please follow-up with the health department and [facility] for continued prenatal care.  Continue taking your prenatal vitamins.  Take Tylenol 1000 mg 3 times a day as needed for pain.

## 2018-12-12 NOTE — ED Notes (Signed)
Printed radiology report given to DR stafford

## 2018-12-12 NOTE — ED Provider Notes (Signed)
[facility] Emergency Department Provider Note  ____________________________________________  Time seen: Approximately 12:20 PM  I have reviewed the triage vital signs and the nursing notes.   HISTORY  Chief Complaint Abdominal Pain    HPI Cindy [name] is a 23 y.o. female with no significant past medical history who complains of diffuse lower abdominal/pelvic pain that started last night, constant, sharp, radiating to bilateral lower back.  Does not feel like contractions or cramping.  No vaginal bleeding or leakage of fluid.  No fevers chills chest pain or shortness of breath.  Eating and drinking normally.  She is [redacted] weeks pregnant by dates, she is following up with the health department and has been referred to [facility] for first trimester ultrasound/genetic screening.      Past Medical History:  Diagnosis Date  . Anemia      Patient Active Problem List   Diagnosis Date Noted  . Postpartum care following vaginal delivery 10/24/2016     Past Surgical History:  Procedure Laterality Date  . COLONOSCOPY WITH PROPOFOL N/A 06/15/2017   Procedure: COLONOSCOPY WITH PROPOFOL;  Surgeon: [name], MD;  Location: [facility] ENDOSCOPY;  Service: Gastroenterology;  Laterality: N/A;  . ESOPHAGOGASTRODUODENOSCOPY (EGD) WITH PROPOFOL N/A 06/15/2017   Procedure: ESOPHAGOGASTRODUODENOSCOPY (EGD) WITH PROPOFOL;  Surgeon: [name], MD;  Location: [facility] ENDOSCOPY;  Service: Gastroenterology;  Laterality: N/A;  . TONSILLECTOMY       Prior to Admission medications   Medication Sig Start Date End Date Taking? Authorizing Provider  dicyclomine (BENTYL) 20 MG tablet Take 1 tablet (20 mg total) by mouth 3 (three) times daily as needed for spasms. 04/15/17 04/15/18  Merlyn Lot, MD  ferrous sulfate 325 (65 FE) MG tablet Take 1 tablet (325 mg total) by mouth 2 (two) times daily with a meal. 05/08/17 08/06/17  [name], MD  omeprazole (PRILOSEC) 40 MG capsule Take 1 capsule  (40 mg total) by mouth daily. Patient not taking: Reported on 12/12/2018 03/15/17   [name], MD  polyethylene glycol [facility] / Floria Raveling) packet Take 17 g by mouth daily. Mix one tablespoon with 8oz of your favorite juice or water every day until you are having soft formed stools. Then start taking once daily if you didn't have a stool the day before. Patient not taking: Reported on 05/01/2017 04/15/17   Merlyn Lot, MD  potassium chloride SA (KLOR-CON M20) 20 MEQ tablet Take 1 tablet (20 mEq total) by mouth daily. Patient not taking: Reported on 12/12/2018 02/03/17   Hinda Kehr, MD  promethazine (PHENERGAN) 12.5 MG tablet Take 1 tablet (12.5 mg total) by mouth every 6 (six) hours as needed for nausea or vomiting. Patient not taking: Reported on 12/12/2018 04/15/17   Merlyn Lot, MD     Allergies Patient has no known allergies.   Family History  Problem Relation Age of Onset  . Thyroid disease Mother   . Hypertension Mother   . Sickle cell trait Mother   . Sickle cell anemia Sister   . Hypertension Maternal Grandmother     Social History Social History   Tobacco Use  . Smoking status: Former Research scientist (life sciences)  . Smokeless tobacco: Never Used  Substance Use Topics  . Alcohol use: No    Comment: former  . Drug use: Yes    Types: Marijuana    Comment: last used in sept    Review of Systems  Constitutional:   No fever or chills.  ENT:   No sore throat. No rhinorrhea. Cardiovascular:  No chest pain or syncope. Respiratory:   No dyspnea or cough. Gastrointestinal:   Positive as above for low abdominal pain without vomiting and diarrhea.  Musculoskeletal:   Negative for focal pain or swelling All other systems reviewed and are negative except as documented above in ROS and HPI.  ____________________________________________   PHYSICAL EXAM:  VITAL SIGNS: ED Triage Vitals [12/12/18 0733]  Enc Vitals Group     BP (!) 142/72     Pulse Rate (!) 104     Resp 18     Temp  98.6 F (37 C)     Temp Source Oral     SpO2 99 %     Weight 220 lb (99.8 kg)     Height 5\' 2"  (1.575 m)     Head Circumference      Peak Flow      Pain Score 9     Pain Loc      Pain Edu?      Excl. in [place]?     Vital signs reviewed, nursing assessments reviewed.   Constitutional:   Alert and oriented. Non-toxic appearance. Eyes:   Conjunctivae are normal. EOMI. PERRL. ENT      Head:   Normocephalic and atraumatic.      Nose:   No congestion/rhinnorhea.       Mouth/Throat:   MMM, no pharyngeal erythema. No peritonsillar mass.       Neck:   No meningismus. Full ROM. Hematological/Lymphatic/Immunilogical:   No cervical lymphadenopathy. Cardiovascular:   RRR. Symmetric bilateral radial and DP pulses.  No murmurs. Cap refill less than 2 seconds. Respiratory:   Normal respiratory effort without tachypnea/retractions. Breath sounds are clear and equal bilaterally. No wheezes/rales/rhonchi. Gastrointestinal:   Soft and nontender. Non distended. There is no CVA tenderness.  No rebound, rigidity, or guarding. Musculoskeletal:   Normal range of motion in all extremities. No joint effusions.  No lower extremity tenderness.  No edema. Neurologic:   Normal speech and language.  Motor grossly intact. No acute focal neurologic deficits are appreciated.  Skin:    Skin is warm, dry and intact. No rash noted.  No petechiae, purpura, or bullae.  ____________________________________________    LABS (pertinent positives/negatives) (all labs ordered are listed, but only abnormal results are displayed) Labs Reviewed  COMPREHENSIVE METABOLIC PANEL - Abnormal; Notable for the following components:      Result Value   CO2 21 (*)    BUN 5 (*)    Creatinine, Ser 0.43 (*)    All other components within normal limits  CBC - Abnormal; Notable for the following components:   RBC 5.27 (*)    HCT 35.3 (*)    MCV 67.0 (*)    MCH 22.8 (*)    RDW 16.5 (*)    All other components within normal limits   URINALYSIS, COMPLETE (UACMP) WITH MICROSCOPIC - Abnormal; Notable for the following components:   Color, Urine YELLOW (*)    APPearance HAZY (*)    Leukocytes,Ua MODERATE (*)    Bacteria, UA RARE (*)    All other components within normal limits  HCG, QUANTITATIVE, PREGNANCY - Abnormal; Notable for the following components:   hCG, Beta Chain, Quant, S 107,160 (*)    All other components within normal limits  POCT PREGNANCY, URINE - Abnormal; Notable for the following components:   Preg Test, Ur POSITIVE (*)    All other components within normal limits  URINE CULTURE  POC URINE PREG, ED  ____________________________________________   EKG    ____________________________________________    RADIOLOGY  US Ob Comp Less 14 Wks  Result Date: 12/12/2018 CLINICAL DATA:  First trimester pregnancy, pelvic pain. EXAM: OBSTETRIC <14 WK ULTRASOUND TECHNIQUE: Transabdominal ultrasound was performed for evaluation of the gestation as well as the maternal uterus and adnexal regions. COMPARISON:  None. FINDINGS: Intrauterine gestational sac: Single visualized. Yolk sac:  Not visualized. Embryo:  Visualized. Cardiac Activity: Visualized. Heart Rate: 150 bpm CRL:   78.4 mm   13 w 6 d                  [place] EDC: June 13, 2019. Subchorionic hemorrhage:  None visualized. Maternal uterus/adnexae: Ovaries not visualized. No free fluid is noted. IMPRESSION: Single live intrauterine gestation of 13 weeks 6 days. Electronically Signed   By: Marijo Conception, M.D.   On: 12/12/2018 11:13   US Renal  Result Date: 12/12/2018 CLINICAL DATA:  Flank pain EXAM: RENAL / URINARY TRACT ULTRASOUND COMPLETE COMPARISON:  None. FINDINGS: Right Kidney: Renal measurements: 11.8 x 4.7 x 5.6 cm. = volume: 163 mL. Normal echogenicity is noted. Minimal fullness of the renal pelvis is seen. This is likely related to the gravid uterus. Left Kidney: Renal measurements: 13.8 x 5.1 x 5.6 cm = volume: 205 mL. Normal echogenicity is noted.  Minimal fullness of the renal pelvis is noted likely related to the gravid uterus. Bladder: Appears normal for degree of bladder distention. Bilateral ureteral jets are noted. IMPRESSION: Mild fullness of the renal pelves bilaterally likely related to compression from the gravid uterus. No other focal abnormality is noted. Electronically Signed   By: Inez Catalina M.D.   On: 12/12/2018 11:15    ____________________________________________   PROCEDURES Procedures  ____________________________________________  DIFFERENTIAL DIAGNOSIS   Ovarian cyst, round ligament pain, pregnancy complication.  Doubt STI PID TOA or torsion.  Unlikely ectopic at this point in pregnancy.  Not consistent with appendicitis diverticulitis bowel obstruction or perforation  CLINICAL IMPRESSION / ASSESSMENT AND PLAN / ED COURSE  Medications ordered in the ED: Medications  acetaminophen (TYLENOL) tablet 1,000 mg (1,000 mg Oral Given 12/12/18 2992)    Pertinent labs & imaging results that were available during my care of the patient were reviewed by me and considered in my medical decision making (see chart for details).    Patient presents with diffuse pelvic pain in early second trimester pregnancy.  Known blood type of AB+ from January 2018 on record review.  Plan to check labs and urinalysis.  Will obtain ultrasound as well though this would not be as detailed as the upcoming ultrasound planned at [facility], will be helpful to evaluate for hydronephrosis/ureterolithiasis or pregnancy complication as a cause of symptoms..  Clinical Course as of Dec 12 1352  Thu Dec 12, 2018  1058 Labs overall unremarkable.  Beta quant is 107,000.  UA not definitive for UTI and patient is not having UTI symptoms.  Follow-up urine culture sent.  Awaiting ultrasound results to evaluate but currently presentation is consistent with round ligament pain, expected at this point in pregnancy.  Patient has been referred to [facility] for prenatal  ultrasound by the health department where she is currently following up.  She is taking prenatal vitamins and is suitable for outpatient follow-up, pending results of ultrasound.   [PS]  1351 Pelvic ultrasound normal.  Live IUP, 13 weeks 6 days gestation, heart rate 150, no subchorionic hemorrhage or ovarian cysts.  Stable for outpatient follow-up.   [PS]  Clinical Course User Index [PS] Carrie Mew, MD     ____________________________________________   FINAL CLINICAL IMPRESSION(S) / ED DIAGNOSES    Final diagnoses:  Second trimester pregnancy  Round ligament pain     ED Discharge Orders    None      Portions of this note were generated with dragon dictation software. Dictation errors may occur despite best attempts at proofreading.   Carrie Mew, MD 12/12/18 [phone]

## 2018-12-12 NOTE — ED Triage Notes (Signed)
PT reports she is [redacted] weeks pregnant with her second pregnancy. Pt states she has had sharp lower abdominal pain starting last night. Pt denies and vaginal discharge or bleeding.

## 2018-12-12 NOTE — ED Notes (Signed)
Patient ambulatory to Rm 26, Vicente Males RN made aware of room placement and patient pregnancy.

## 2018-12-13 LAB — URINE CULTURE

## 2019-01-15 ENCOUNTER — Other Ambulatory Visit (HOSPITAL_COMMUNITY): Payer: Self-pay | Admitting: Family Medicine

## 2019-01-16 LAB — CBC
Hematocrit: 35.8 % (ref 34.0–46.6)
Hemoglobin: 11.6 g/dL (ref 11.1–15.9)
MCH: 22.6 pg — ABNORMAL LOW (ref 26.6–33.0)
MCHC: 32.4 g/dL (ref 31.5–35.7)
MCV: 70 fL — ABNORMAL LOW (ref 79–97)
Platelets: 316 10*3/uL (ref 150–450)
RBC: 5.13 x10E6/uL (ref 3.77–5.28)
RDW: 17.2 % — ABNORMAL HIGH (ref 11.7–15.4)
WBC: 10.1 10*3/uL (ref 3.4–10.8)

## 2019-01-20 ENCOUNTER — Other Ambulatory Visit: Payer: Self-pay | Admitting: Obstetrics & Gynecology

## 2019-01-20 ENCOUNTER — Ambulatory Visit (INDEPENDENT_AMBULATORY_CARE_PROVIDER_SITE_OTHER): Payer: Medicaid Other

## 2019-01-20 ENCOUNTER — Other Ambulatory Visit: Payer: Self-pay

## 2019-01-20 DIAGNOSIS — Z363 Encounter for antenatal screening for malformations: Secondary | ICD-10-CM

## 2019-01-22 ENCOUNTER — Encounter: Payer: Self-pay | Admitting: *Deleted

## 2019-01-22 ENCOUNTER — Emergency Department
Admission: EM | Admit: 2019-01-22 | Discharge: 2019-01-22 | Disposition: A | Discharge status: Home / Self Care | Payer: Medicaid Other | Attending: Emergency Medicine | Admitting: Emergency Medicine

## 2019-01-22 ENCOUNTER — Other Ambulatory Visit: Payer: Self-pay

## 2019-01-22 DIAGNOSIS — R51 Headache: Secondary | ICD-10-CM | POA: Insufficient documentation

## 2019-01-22 DIAGNOSIS — O9989 Other specified diseases and conditions complicating pregnancy, childbirth and the puerperium: Secondary | ICD-10-CM | POA: Diagnosis not present

## 2019-01-22 DIAGNOSIS — Z3A20 20 weeks gestation of pregnancy: Secondary | ICD-10-CM | POA: Diagnosis not present

## 2019-01-22 DIAGNOSIS — R519 Headache, unspecified: Secondary | ICD-10-CM

## 2019-01-22 DIAGNOSIS — O219 Vomiting of pregnancy, unspecified: Secondary | ICD-10-CM | POA: Insufficient documentation

## 2019-01-22 DIAGNOSIS — Z87891 Personal history of nicotine dependence: Secondary | ICD-10-CM | POA: Diagnosis not present

## 2019-01-22 LAB — CBC
HCT: 31.7 % — ABNORMAL LOW (ref 36.0–46.0)
Hemoglobin: 10.8 g/dL — ABNORMAL LOW (ref 12.0–15.0)
MCH: 22.7 pg — ABNORMAL LOW (ref 26.0–34.0)
MCHC: 34.1 g/dL (ref 30.0–36.0)
MCV: 66.6 fL — ABNORMAL LOW (ref 80.0–100.0)
Platelets: 286 10*3/uL (ref 150–400)
RBC: 4.76 MIL/uL (ref 3.87–5.11)
RDW: 16.5 % — ABNORMAL HIGH (ref 11.5–15.5)
WBC: 11.4 10*3/uL — ABNORMAL HIGH (ref 4.0–10.5)
nRBC: 0 % (ref 0.0–0.2)

## 2019-01-22 LAB — COMPREHENSIVE METABOLIC PANEL
ALT: 21 U/L (ref 0–44)
AST: 23 U/L (ref 15–41)
Albumin: 3.1 g/dL — ABNORMAL LOW (ref 3.5–5.0)
Alkaline Phosphatase: 62 U/L (ref 38–126)
Anion gap: 11 (ref 5–15)
BUN: 5 mg/dL — ABNORMAL LOW (ref 6–20)
CO2: 20 mmol/L — ABNORMAL LOW (ref 22–32)
Calcium: 8.7 mg/dL — ABNORMAL LOW (ref 8.9–10.3)
Chloride: 106 mmol/L (ref 98–111)
Creatinine, Ser: 0.46 mg/dL (ref 0.44–1.00)
GFR calc Af Amer: >60 mL/min (ref >60)
GFR calc non Af Amer: >60 mL/min (ref >60)
Glucose, Bld: 108 mg/dL — ABNORMAL HIGH (ref 70–99)
Potassium: 3.8 mmol/L (ref 3.5–5.1)
Sodium: 137 mmol/L (ref 135–145)
Total Bilirubin: 0.6 mg/dL (ref 0.3–1.2)
Total Protein: 6.8 g/dL (ref 6.5–8.1)

## 2019-01-22 MED ORDER — SODIUM CHLORIDE 0.9 % IV BOLUS
1000.0000 mL | Freq: Once | INTRAVENOUS | Status: AC
  Administered 2019-01-22: 1000 mL via INTRAVENOUS

## 2019-01-22 MED ORDER — PROMETHAZINE HCL 25 MG PO TABS
12.5000 mg | ORAL_TABLET | ORAL | Status: AC
  Administered 2019-01-22: 17:00:00 12.5 mg via ORAL
  Filled 2019-01-22: qty 1

## 2019-01-22 MED ORDER — ACETAMINOPHEN 500 MG PO TABS
1000.0000 mg | ORAL_TABLET | ORAL | Status: AC
  Administered 2019-01-22: 1000 mg via ORAL
  Filled 2019-01-22: qty 2

## 2019-01-22 MED ORDER — PROMETHAZINE HCL 25 MG/ML IJ SOLN: 12.5000 mg | Freq: Once | INTRAMUSCULAR | Status: DC

## 2019-01-22 MED ORDER — PROMETHAZINE HCL 12.5 MG PO TABS: 12.5000 mg | ORAL_TABLET | Freq: Three times a day (TID) | ORAL | 0 refills | Status: DC | PRN

## 2019-01-22 NOTE — ED Provider Notes (Signed)
[facility] Emergency Department Provider Note   ____________________________________________   First MD Initiated Contact with Patient 01/22/19 1624     (approximate)  I have reviewed the triage vital signs and the nursing notes.   HISTORY  Chief Complaint Headache and Emesis    HPI Cindy [name] is a 23 y.o. female who is due in August, she is [redacted] weeks pregnant  Patient reports that she has been having off-and-on headaches throughout this entire pregnancy.  Last night she developed similar headache took one Tylenol tablet and went to bed.  When she woke up this morning she continued to have this persisting feeling of a hard to describe mild headache.  It is fairly diffuse but she says she is, hard to describe it is not sharp.  It was not sudden onset.  Is not the worst headache she is ever experienced.  She reports she thinks she has a history of "migraines"  There is no fevers or chills.  She has no abdominal pain.  She is not had any vaginal bleeding or loss of fluid.  Baby is moving normally  No chest pain or shortness of breath.  He reports overall she thinks she probably needs bit of IV fluid and a little bit of IV hydration might help get rid of her headache.  No history of cancer.  No numbness tingling or weakness.  No neck pain.  No eye pain.  She did feel nauseated and threw up 3 times this morning but also reports she suffered with morning sickness throughout the pregnancy.  She was prescribed promethazine on a previous visit.  Does seem to be helpful.  She is still able to eat and drink some   Past Medical History:  Diagnosis Date  . Anemia     Patient Active Problem List   Diagnosis Date Noted  . Postpartum care following vaginal delivery 10/24/2016    Past Surgical History:  Procedure Laterality Date  . COLONOSCOPY WITH PROPOFOL N/A 06/15/2017   Procedure: COLONOSCOPY WITH PROPOFOL;  Surgeon: [name], MD;  [facility]  ENDOSCOPY;  Service: Gastroenterology;  Laterality: N/A;  . ESOPHAGOGASTRODUODENOSCOPY (EGD) WITH PROPOFOL N/A 06/15/2017   Procedure: ESOPHAGOGASTRODUODENOSCOPY (EGD) WITH PROPOFOL;  Surgeon: [name], MD;  [facility] ENDOSCOPY;  Service: Gastroenterology;  Laterality: N/A;  . TONSILLECTOMY      Prior to Admission medications   Medication Sig Start Date End Date Taking? Authorizing Provider  dicyclomine (BENTYL) 20 MG tablet Take 1 tablet (20 mg total) by mouth 3 (three) times daily as needed for spasms. 04/15/17 04/15/18  Merlyn Lot, MD  ferrous sulfate 325 (65 FE) MG tablet Take 1 tablet (325 mg total) by mouth 2 (two) times daily with a meal. 05/08/17 08/06/17  [name], MD  omeprazole (PRILOSEC) 40 MG capsule Take 1 capsule (40 mg total) by mouth daily. Patient not taking: Reported on 12/12/2018 03/15/17   [name], MD  polyethylene glycol [facility] / Floria Raveling) packet Take 17 g by mouth daily. Mix one tablespoon with 8oz of your favorite juice or water every day until you are having soft formed stools. Then start taking once daily if you didn't have a stool the day before. Patient not taking: Reported on 05/01/2017 04/15/17   Merlyn Lot, MD  potassium chloride SA (KLOR-CON M20) 20 MEQ tablet Take 1 tablet (20 mEq total) by mouth daily. Patient not taking: Reported on 12/12/2018 02/03/17   Hinda Kehr, MD  promethazine (PHENERGAN) 12.5 MG tablet Take  1 tablet (12.5 mg total) by mouth every 8 (eight) hours as needed for nausea or vomiting. 01/22/19   Delman Kitten, MD    Allergies Patient has no known allergies.  Family History  Problem Relation Age of Onset  . Thyroid disease Mother   . Hypertension Mother   . Sickle cell trait Mother   . Sickle cell anemia Sister   . Hypertension Maternal Grandmother     Social History Social History   Tobacco Use  . Smoking status: Former Research scientist (life sciences)  . Smokeless tobacco: Never Used  Substance Use Topics  . Alcohol use: No     Comment: former  . Drug use: Yes    Types: Marijuana    Comment: last used in sept    Review of Systems Constitutional: No fever/chills Eyes: No visual changes. ENT: No sore throat. Cardiovascular: Denies chest pain. Respiratory: Denies shortness of breath. Gastrointestinal: No abdominal pain.   Genitourinary: Negative for dysuria. Musculoskeletal: Negative for back pain.  No muscle aches. Skin: Negative for rash. Neurological: Negative for areas of focal weakness or numbness.  No problems with her coordination or walking.    ____________________________________________   PHYSICAL EXAM:  VITAL SIGNS: ED Triage Vitals  Enc Vitals Group     BP 01/22/19 1618 136/69     Pulse Rate 01/22/19 1618 79     Resp 01/22/19 1618 18     Temp 01/22/19 1618 98.6 F (37 C)     Temp Source 01/22/19 1618 Oral     SpO2 01/22/19 1618 100 %     Weight 01/22/19 1619 220 lb (99.8 kg)     Height 01/22/19 1619 5' (1.524 m)     Head Circumference --      Peak Flow --      Pain Score 01/22/19 1618 9     Pain Loc --      Pain Edu? --      Excl. in [place]? --     Constitutional: Alert and oriented. Well appearing and in no acute distress.  She is very pleasant. Eyes: Conjunctivae are normal. Head: Atraumatic. Nose: No congestion/rhinnorhea. Mouth/Throat: Mucous membranes are moist. Neck: No stridor.  No meningismus. Cardiovascular: Normal rate, regular rhythm. Grossly normal heart sounds.  Good peripheral circulation. Respiratory: Normal respiratory effort.  No retractions. Lungs CTAB. Gastrointestinal: Soft and nontender. No distention. Musculoskeletal: No lower extremity tenderness nor edema.  He nontender.  Obviously gravid, fundus palpated just below the umbilicus. Neurologic:  Normal speech and language. No gross focal neurologic deficits are appreciated.  No ataxia in any extremity.  Cranial nerve exam is normal.  Normal sensation and strength in all extremities.  Speaks in full and clear  sentences.  Normal finger-nose-finger bilateral. Skin:  Skin is warm, dry and intact. No rash noted. Psychiatric: Mood and affect are normal. Speech and behavior are normal.  ____________________________________________   LABS (all labs ordered are listed, but only abnormal results are displayed)  Labs Reviewed  CBC - Abnormal; Notable for the following components:      Result Value   WBC 11.4 (*)    Hemoglobin 10.8 (*)    HCT 31.7 (*)    MCV 66.6 (*)    MCH 22.7 (*)    RDW 16.5 (*)    All other components within normal limits  COMPREHENSIVE METABOLIC PANEL - Abnormal; Notable for the following components:   CO2 20 (*)    Glucose, Bld 108 (*)    BUN 5 (*)  Calcium 8.7 (*)    Albumin 3.1 (*)    All other components within normal limits   ____________________________________________  EKG   ____________________________________________  RADIOLOGY  No acute indication for CT imaging. ____________________________________________   PROCEDURES  Procedure(s) performed: None  Procedures  Critical Care performed: No  ____________________________________________   INITIAL IMPRESSION / ASSESSMENT AND PLAN / ED COURSE  Pertinent labs & imaging results that were available during my care of the patient were reviewed by me and considered in my medical decision making (see chart for details).  Patient presents for evaluation of a headache.  She has a very reassuring clinical examination.  Normal vital signs.  Will check a CBC due to her history of anemia, also LFTs and evaluate for dehydration with creatinine or electrolyte abnormality in consideration of cause of headache.  There is no indication for brain imaging.  No systemic symptoms.  No acute neurologic symptoms.  Reassuring neurologic examination.  Patient is young and age.  No sudden onset or thunderclap headache.  She has been experiencing a pattern of headaches throughout this pregnancy, reports are similar.   Well-appearing nontoxic.  FHTs normal  ----------------------------------------- 7:06 PM on 01/22/2019 -----------------------------------------  Patient feels improved.  Headache is gone away.  No vomiting currently drinking ginger ale without any issue.  Reports she feels ready to go home.  Clinical examination reassuring, discussed use of pre-vitamin tablets and she reports she is been on iron tablets in the past which she will discuss with the health department on her next visit that is upcoming soon.  Chronic anemia.  Lab work reassuring.    Clinical Course as of Jan 22 1907  Wed Jan 22, 2019  1746 FHT 140s, normal   [MQ]    Clinical Course User Index [MQ] Delman Kitten, MD    Patient will be discharged home.  Feeling improved.  She does request a refill of the Phenergan prescription she was previously given.  Discussed risk benefits of this medication, and given her previous good response to the will prescribe once again for her pregnancy associated nausea. ____________________________________________   FINAL CLINICAL IMPRESSION(S) / ED DIAGNOSES  Final diagnoses:  Nonintractable episodic headache, unspecified headache type  Nausea and vomiting during pregnancy        Note:  This document was prepared using Dragon voice recognition software and may include unintentional dictation errors       Delman Kitten, MD 01/22/19 1909

## 2019-01-22 NOTE — ED Notes (Signed)
Fetal Heart Tone Assessment 147 beats/min

## 2019-01-22 NOTE — ED Triage Notes (Addendum)
Pt ambulatory to triage.  Pt has a headache since yesterday. Pt took tylenol without relief.  Pt is pregnant approx 20 weeks.  No abd pain.  No vag bleeding.  Pt vomited x 3. No diarrhea.   Pt alert   Speech clear.

## 2019-01-29 ENCOUNTER — Other Ambulatory Visit: Payer: Self-pay

## 2019-01-30 ENCOUNTER — Other Ambulatory Visit: Payer: Self-pay

## 2019-01-30 ENCOUNTER — Encounter: Payer: Self-pay | Admitting: Oncology

## 2019-01-30 ENCOUNTER — Inpatient Hospital Stay: Payer: Medicaid Other

## 2019-01-30 ENCOUNTER — Inpatient Hospital Stay: Payer: Medicaid Other | Attending: Oncology | Admitting: Oncology

## 2019-01-30 DIAGNOSIS — D573 Sickle-cell trait: Secondary | ICD-10-CM | POA: Insufficient documentation

## 2019-01-30 DIAGNOSIS — Z3A21 21 weeks gestation of pregnancy: Secondary | ICD-10-CM | POA: Insufficient documentation

## 2019-01-30 DIAGNOSIS — Z87891 Personal history of nicotine dependence: Secondary | ICD-10-CM | POA: Diagnosis not present

## 2019-01-30 DIAGNOSIS — Z79899 Other long term (current) drug therapy: Secondary | ICD-10-CM | POA: Diagnosis not present

## 2019-01-30 DIAGNOSIS — O99012 Anemia complicating pregnancy, second trimester: Secondary | ICD-10-CM | POA: Diagnosis not present

## 2019-01-30 DIAGNOSIS — D582 Other hemoglobinopathies: Secondary | ICD-10-CM

## 2019-01-30 DIAGNOSIS — D509 Iron deficiency anemia, unspecified: Secondary | ICD-10-CM

## 2019-01-30 HISTORY — DX: Other hemoglobinopathies: D58.2

## 2019-01-30 LAB — RETICULOCYTES
Immature Retic Fract: 31.6 % — ABNORMAL HIGH (ref 2.3–15.9)
RBC.: 4.87 MIL/uL (ref 3.87–5.11)
Retic Count, Absolute: 78.9 10*3/uL (ref 19.0–186.0)
Retic Ct Pct: 1.6 % (ref 0.4–3.1)

## 2019-01-30 LAB — IRON AND TIBC
Iron: 41 ug/dL (ref 28–170)
Saturation Ratios: 8 % — ABNORMAL LOW (ref 10.4–31.8)
TIBC: 516 ug/dL — ABNORMAL HIGH (ref 250–450)
UIBC: 475 ug/dL

## 2019-01-30 LAB — CBC WITH DIFFERENTIAL/PLATELET
Abs Immature Granulocytes: 0.06 10*3/uL (ref 0.00–0.07)
Basophils Absolute: 0 10*3/uL (ref 0.0–0.1)
Basophils Relative: 0 %
Eosinophils Absolute: 0.2 10*3/uL (ref 0.0–0.5)
Eosinophils Relative: 1 %
HCT: 32 % — ABNORMAL LOW (ref 36.0–46.0)
Hemoglobin: 11.2 g/dL — ABNORMAL LOW (ref 12.0–15.0)
Immature Granulocytes: 1 %
Lymphocytes Relative: 27 %
Lymphs Abs: 2.9 10*3/uL (ref 0.7–4.0)
MCH: 23 pg — ABNORMAL LOW (ref 26.0–34.0)
MCHC: 35 g/dL (ref 30.0–36.0)
MCV: 65.7 fL — ABNORMAL LOW (ref 80.0–100.0)
Monocytes Absolute: 0.7 10*3/uL (ref 0.1–1.0)
Monocytes Relative: 7 %
Neutro Abs: 7 10*3/uL (ref 1.7–7.7)
Neutrophils Relative %: 64 %
Platelets: 285 10*3/uL (ref 150–400)
RBC: 4.87 MIL/uL (ref 3.87–5.11)
RDW: 16.3 % — ABNORMAL HIGH (ref 11.5–15.5)
WBC: 10.8 10*3/uL — ABNORMAL HIGH (ref 4.0–10.5)
nRBC: 0 % (ref 0.0–0.2)

## 2019-01-30 LAB — FERRITIN: Ferritin: 8 ng/mL — ABNORMAL LOW (ref 11–307)

## 2019-01-30 LAB — VITAMIN B12: Vitamin B-12: 302 pg/mL (ref 180–914)

## 2019-01-30 LAB — FOLATE: Folate: 14.4 ng/mL (ref >5.9)

## 2019-01-30 LAB — TECHNOLOGIST SMEAR REVIEW: Plt Morphology: ADEQUATE

## 2019-01-30 NOTE — Progress Notes (Signed)
Pt states she no longer craves ice, she is tired and sometimes gets nauseated and to her knowledge she was sent here as new patient because of some abn. labs

## 2019-01-30 NOTE — Progress Notes (Signed)
I connected with Cindy [name] on 01/30/19 at  8:45 AM EDT by video enabled telemedicine visit and verified that I am speaking with the correct person using two identifiers.   I discussed the limitations, risks, security and privacy concerns of performing an evaluation and management service by telemedicine and the availability of in-person appointments. I also discussed with the patient that there may be a patient responsible charge related to this service. The patient expressed understanding and agreed to proceed.  Other persons participating in the visit and their role in the encounter:  none  Patient's location:  home Provider's location:  Home  Reason for referral: Microcytic anemia in the second trimester of pregnancy Referring provider Dr. Ernestina Patches   History of present illness: Patient is a 23 year old African-American female G2, P1 L1.  She is currently [redacted] weeks pregnant.  She has a history of sickle cell trait and reports that this was tested in her childhood.  She does not have overt sickle cell disease and has not had any sickle cell crisis in the past.  Her first pregnancy was complicated by iron deficiency anemia and neonatal aspiration.  Patient sister has sickle cell disease and mother has sickle cell trait.  So far her pregnancy has been coming along uneventfully.  She reports mild fatigue but denies other complaints.  She is taking her prenatal vitamins but has not taken any oral iron separately.  Denies any gum bleeds, nosebleeds or bleeding in her stool or urine.  Denies any vaginal spotting   Most recent CBC from 12/09/2018 showed hemoglobin of 12.1.  Platelets 372.  Labs from 01/16/2019 showed white count of 10.1, H&H of 11.6/35.8 with an MCV of 70 and a platelet count of 316.  LFTs were within normal limits.  Review of Systems  Constitutional: Positive for malaise/fatigue. Negative for chills, fever and weight loss.  HENT: Negative for congestion, ear discharge and nosebleeds.    Eyes: Negative for blurred vision.  Respiratory: Negative for cough, hemoptysis, sputum production, shortness of breath and wheezing.   Cardiovascular: Negative for chest pain, palpitations, orthopnea and claudication.  Gastrointestinal: Negative for abdominal pain, blood in stool, constipation, diarrhea, heartburn, melena, nausea and vomiting.  Genitourinary: Negative for dysuria, flank pain, frequency, hematuria and urgency.  Musculoskeletal: Negative for back pain, joint pain and myalgias.  Skin: Negative for rash.  Neurological: Negative for dizziness, tingling, focal weakness, seizures, weakness and headaches.  Endo/Heme/Allergies: Does not bruise/bleed easily.  Psychiatric/Behavioral: Negative for depression and suicidal ideas. The patient does not have insomnia.     No Known Allergies  Past Medical History:  Diagnosis Date  . Anemia     Past Surgical History:  Procedure Laterality Date  . COLONOSCOPY WITH PROPOFOL N/A 06/15/2017   Procedure: COLONOSCOPY WITH PROPOFOL;  Surgeon: [name], MD;  Location: [facility] ENDOSCOPY;  Service: Gastroenterology;  Laterality: N/A;  . ESOPHAGOGASTRODUODENOSCOPY (EGD) WITH PROPOFOL N/A 06/15/2017   Procedure: ESOPHAGOGASTRODUODENOSCOPY (EGD) WITH PROPOFOL;  Surgeon: [name], MD;  Location: [facility] ENDOSCOPY;  Service: Gastroenterology;  Laterality: N/A;  . TONSILLECTOMY      Social History   Socioeconomic History  . Marital status: Single    Spouse name: Not on file  . Number of children: Not on file  . Years of education: Not on file  . Highest education level: Not on file  Occupational History  . Not on file  Social Needs  . Financial resource strain: Not on file  . Food insecurity:    Worry: Not on  file    Inability: Not on file  . Transportation needs:    Medical: Not on file    Non-medical: Not on file  Tobacco Use  . Smoking status: Former Research scientist (life sciences)  . Smokeless tobacco: Never Used  Substance and Sexual Activity  . Alcohol  use: No    Comment: former  . Drug use: Yes    Types: Marijuana    Comment: last used in sept  . Sexual activity: Yes    Partners: Male    Birth control/protection: Injection  Lifestyle  . Physical activity:    Days per week: Not on file    Minutes per session: Not on file  . Stress: Not on file  Relationships  . Social connections:    Talks on phone: Not on file    Gets together: Not on file    Attends religious service: Not on file    Active member of club or organization: Not on file    Attends meetings of clubs or organizations: Not on file    Relationship status: Not on file  . Intimate partner violence:    Fear of current or ex partner: Not on file    Emotionally abused: Not on file    Physically abused: Not on file    Forced sexual activity: Not on file  Other Topics Concern  . Not on file  Social History Narrative  . Not on file    Family History  Problem Relation Age of Onset  . Thyroid disease Mother   . Hypertension Mother   . Sickle cell trait Mother   . Sickle cell anemia Sister   . Hypertension Maternal Grandmother      Current Outpatient Medications:  .  Prenatal Vit-Fe Fumarate-FA (PRENATAL VITAMIN PO), Take 1 tablet by mouth daily., Disp: , Rfl:  .  dicyclomine (BENTYL) 20 MG tablet, Take 1 tablet (20 mg total) by mouth 3 (three) times daily as needed for spasms., Disp: 20 tablet, Rfl: 0 .  ferrous sulfate 325 (65 FE) MG tablet, Take 1 tablet (325 mg total) by mouth 2 (two) times daily with a meal., Disp: 180 tablet, Rfl: 0 .  omeprazole (PRILOSEC) 40 MG capsule, Take 1 capsule (40 mg total) by mouth daily. (Patient not taking: Reported on 12/12/2018), Disp: 90 capsule, Rfl: 3 .  polyethylene glycol (MIRALAX / GLYCOLAX) packet, Take 17 g by mouth daily. Mix one tablespoon with 8oz of your favorite juice or water every day until you are having soft formed stools. Then start taking once daily if you didn't have a stool the day before. (Patient not taking:  Reported on 05/01/2017), Disp: 30 each, Rfl: 0 .  potassium chloride SA (KLOR-CON M20) 20 MEQ tablet, Take 1 tablet (20 mEq total) by mouth daily. (Patient not taking: Reported on 12/12/2018), Disp: 7 tablet, Rfl: 0 .  promethazine (PHENERGAN) 12.5 MG tablet, Take 1 tablet (12.5 mg total) by mouth every 8 (eight) hours as needed for nausea or vomiting., Disp: 20 tablet, Rfl: 0  US Ob Comp + 14 Wk  Result Date: 01/23/2019 Patient Name: Cindy [name] DOB: 04-22-1996 MRN: [id number] ULTRASOUND REPORT Location: [facility] Date of Service: 01/20/2019 Indications:Anatomy Ultrasound Findings: Nelda Marseille intrauterine pregnancy is visualized with FHR at 169 BPM. Biometrics give an (U/S) Gestational age of [redacted]w[redacted]d and an (U/S) EDD of 06/14/2019; this correlates with the clinically established Estimated Date of Delivery 06/11/2019. Fetal presentation is Variable. EFW: 271 g ( 10 oz). Placenta: posterior. Grade: 0 AFI: subjectively  normal. Anatomic survey is complete and normal; Gender - female.  Right Ovary is not normal in appearance. Left Ovary is not normal appearance. Survey of the adnexa demonstrates no adnexal masses. There is no free peritoneal fluid in the cul de sac. Impression: 1.  Viable Singleton Intrauterine pregnancy by U/S 19w 2d. 2. (U/S) EDD is consistent with Clinically established Estimated Date of Delivery:06/11/2019 3. Normal Anatomy Scan Recommendations: 1.Clinical correlation with the patient's History and Physical Exam. Lillia Dallas, RDMS Review of ULTRASOUND. I have personally reviewed images and report of recent ultrasound done at [facility]. There is a singleton gestation with subjectively normal amniotic fluid volume. The fetal biometry correlates with established dating. Detailed evaluation of the fetal anatomy was performed.The fetal anatomical survey appears within normal limits within the resolution of ultrasound as described above.  It must be noted that a normal ultrasound is unable to rule  out fetal aneuploidy.  Barnett Applebaum, MD, [facility], [facility] 01/23/2019  8:10 AM    No images are attached to the encounter.   CMP Latest Ref Rng & Units 01/22/2019  Glucose 70 - 99 mg/dL 108(H)  BUN 6 - 20 mg/dL 5(L)  Creatinine 0.44 - 1.00 mg/dL 0.46  Sodium 135 - 145 mmol/L 137  Potassium 3.5 - 5.1 mmol/L 3.8  Chloride 98 - 111 mmol/L 106  CO2 22 - 32 mmol/L 20(L)  Calcium 8.9 - 10.3 mg/dL 8.7(L)  Total Protein 6.5 - 8.1 g/dL 6.8  Total Bilirubin 0.3 - 1.2 mg/dL 0.6  Alkaline Phos 38 - 126 U/L 62  AST 15 - 41 U/L 23  ALT 0 - 44 U/L 21   CBC Latest Ref Rng & Units 01/22/2019  WBC 4.0 - 10.5 K/uL 11.4(H)  Hemoglobin 12.0 - 15.0 g/dL 10.8(L)  Hematocrit 36.0 - 46.0 % 31.7(L)  Platelets 150 - 400 K/uL 286     Observation/objective: Patient does not appear to be in any acute distress over the video visit today.  Breathing is nonlabored  Assessment and plan: Patient is a 23 year old African-American female currently in her second trimester of pregnancy referred for microcytic anemia  1.  Suspect microcytosis which is disproportionate to the degree of anemia is likely secondary to sickle cell trait.  I will get a hemoglobin electrophoresis testing to a certain that she is a sickle cell trait since this was tested back in her childhood and we do not have any records of that.  2.  Patient's baseline hemoglobin prepregnancy runs between 12-13 and has drifted down to 11 as per recent labs.  I will check ferritin and iron studies, B12 and folate TSH and reticulocyte count as well as haptoglobin.  We will call the patient with the results of her blood work and if she has evidence of iron deficiency I would like her to get a trial of oral iron 325 mg twice a day for a month and see if she responds to it.  If she has no significant response to oral iron I will consider bringing her in for IV iron.  I would like to avoid IV iron infusion especially given that she is  pregnant in the middle of COVID pandemic.  Follow-up instructions: Labs today and repeat labs in 5 weeks followed by video visit with provider  I discussed the assessment and treatment plan with the patient. The patient was provided an opportunity to ask questions and all were answered. The patient agreed with the plan and demonstrated an  understanding of the instructions.   The patient was advised to call back or seek an in-person evaluation if the symptoms worsen or if the condition fails to improve as anticipated.  I provided 30 minutes of face-to-face video visit time during this encounter, and > 50% was spent counseling as documented under my assessment & plan.  Visit Diagnosis: 1. Microcytic anemia   2. Sickle cell trait ([place])   3. Anemia affecting pregnancy in second trimester     Dr. Randa Evens, MD, MPH [facility] at [facility] [phone] 01/30/2019 8:27 AM

## 2019-01-31 ENCOUNTER — Encounter: Payer: Self-pay | Admitting: Oncology

## 2019-01-31 LAB — HAPTOGLOBIN: Haptoglobin: 170 mg/dL (ref 33–278)

## 2019-02-04 LAB — HEMOGLOBINOPATHY EVALUATION
Hgb A2 Quant: 3.3 % — ABNORMAL HIGH (ref 1.8–3.2)
Hgb A: 64.1 % — ABNORMAL LOW (ref 96.4–98.8)
Hgb C: 32.1 % — ABNORMAL HIGH
Hgb F Quant: 0.5 % (ref 0.0–2.0)
Hgb S Quant: 0 %
Hgb Variant: 0 %

## 2019-02-05 ENCOUNTER — Telehealth: Payer: Self-pay | Admitting: *Deleted

## 2019-02-05 NOTE — Telephone Encounter (Signed)
Called patient and went over results and the suggestion to start b12  And ferrous sulfate. She states she will go and get both meds above and start taking them. I told her if the iron bothered her with constipation or irritated her stomach to call back and let us know. I gave her the direct phone number for our team

## 2019-02-05 NOTE — Telephone Encounter (Signed)
-----   Message from Sindy Guadeloupe, MD sent at 02/05/2019  8:09 AM EDT ----- Please let patient know that she has mild anemia hb 11. She has evidence of iron deficiency. b12 levels are borderline at 302. She should take po iron 325 mg twice daily or every other day. Also take b12 1000 mcg daily. Keep labs and appointment in 5 weeks as scheduled. Thanks, Astrid Divine

## 2019-02-27 ENCOUNTER — Other Ambulatory Visit: Payer: Self-pay

## 2019-02-27 ENCOUNTER — Observation Stay
Admission: EM | Admit: 2019-02-27 | Discharge: 2019-02-27 | Disposition: A | Discharge status: Home / Self Care | Payer: Medicaid Other | Attending: Certified Nurse Midwife | Admitting: Certified Nurse Midwife

## 2019-02-27 DIAGNOSIS — F329 Major depressive disorder, single episode, unspecified: Secondary | ICD-10-CM | POA: Insufficient documentation

## 2019-02-27 DIAGNOSIS — O26892 Other specified pregnancy related conditions, second trimester: Secondary | ICD-10-CM | POA: Diagnosis present

## 2019-02-27 DIAGNOSIS — Z87891 Personal history of nicotine dependence: Secondary | ICD-10-CM | POA: Diagnosis not present

## 2019-02-27 DIAGNOSIS — R102 Pelvic and perineal pain: Secondary | ICD-10-CM | POA: Insufficient documentation

## 2019-02-27 DIAGNOSIS — Z79899 Other long term (current) drug therapy: Secondary | ICD-10-CM | POA: Diagnosis not present

## 2019-02-27 DIAGNOSIS — O99342 Other mental disorders complicating pregnancy, second trimester: Secondary | ICD-10-CM | POA: Insufficient documentation

## 2019-02-27 DIAGNOSIS — O99212 Obesity complicating pregnancy, second trimester: Secondary | ICD-10-CM | POA: Diagnosis not present

## 2019-02-27 DIAGNOSIS — Z3A25 25 weeks gestation of pregnancy: Secondary | ICD-10-CM | POA: Diagnosis not present

## 2019-02-27 DIAGNOSIS — O2342 Unspecified infection of urinary tract in pregnancy, second trimester: Secondary | ICD-10-CM | POA: Insufficient documentation

## 2019-02-27 LAB — URINALYSIS, COMPLETE (UACMP) WITH MICROSCOPIC
Bilirubin Urine: NEGATIVE
Glucose, UA: NEGATIVE mg/dL
Hgb urine dipstick: NEGATIVE
Ketones, ur: 80 mg/dL — AB
Nitrite: POSITIVE — AB
Protein, ur: NEGATIVE mg/dL
Specific Gravity, Urine: 1.014 (ref 1.005–1.030)
pH: 6 (ref 5.0–8.0)

## 2019-02-27 LAB — CHLAMYDIA/NGC RT PCR (ARMC ONLY)
Chlamydia Tr: NOT DETECTED
N gonorrhoeae: NOT DETECTED

## 2019-02-27 LAB — WET PREP, GENITAL
Clue Cells Wet Prep HPF POC: NONE SEEN
Sperm: NONE SEEN
Trich, Wet Prep: NONE SEEN
Yeast Wet Prep HPF POC: NONE SEEN

## 2019-02-27 MED ORDER — CYCLOBENZAPRINE HCL 10 MG PO TABS
5.0000 mg | ORAL_TABLET | Freq: Three times a day (TID) | ORAL | Status: DC | PRN
  Administered 2019-02-27: 16:00:00 5 mg via ORAL
  Filled 2019-02-27: qty 1

## 2019-02-27 MED ORDER — ACETAMINOPHEN 500 MG PO TABS
1000.0000 mg | ORAL_TABLET | Freq: Four times a day (QID) | ORAL | Status: DC | PRN
  Administered 2019-02-27: 16:00:00 1000 mg via ORAL
  Filled 2019-02-27: qty 2

## 2019-02-27 MED ORDER — ACETAMINOPHEN 500 MG PO TABS: 1000.0000 mg | ORAL_TABLET | Freq: Four times a day (QID) | ORAL | 0 refills | Status: DC | PRN

## 2019-02-27 MED ORDER — SULFAMETHOXAZOLE-TRIMETHOPRIM 800-160 MG PO TABS: 1.0000 | ORAL_TABLET | Freq: Two times a day (BID) | ORAL | 0 refills | Status: AC

## 2019-02-27 NOTE — Discharge Summary (Addendum)
Cindy [name] is a 23 y.o. female. She is at [redacted]w[redacted]d gestation. Patient's last menstrual period was 09/04/2018. Estimated Date of Delivery: 06/11/19  Prenatal care site: [facility]  Chief complaint: abdominal pain Location: lower abdomen Onset/timing: started this morning around 10:30am Duration: intermittent Quality: sharp Severity: moderate to severe Aggravating or alleviating conditions: none Associated signs/symptoms:none Context: [name] reports a lower abdominal pain that started this morning. She reports that she took 650mg  of Tylenol with no relief. The pain radiates to her lower back. The pain lasts for 10-15 seconds when it comes and comes once an hour or so. She reports adequate hydration, adequate oral intake of food, and no recent intercourse.   S: Resting comfortably.   She reports:  -active fetal movement -no leakage of fluid -no vaginal bleeding  Maternal Medical History:   Past Medical History:  Diagnosis Date  . Anemia   . Depression    is past hx but not active now per pt  . Morbidly obese ([place])    notes from health dept as hx    Past Surgical History:  Procedure Laterality Date  . COLONOSCOPY WITH PROPOFOL N/A 06/15/2017   Procedure: COLONOSCOPY WITH PROPOFOL;  Surgeon: [name], MD;  Location: [facility] ENDOSCOPY;  Service: Gastroenterology;  Laterality: N/A;  . ESOPHAGOGASTRODUODENOSCOPY (EGD) WITH PROPOFOL N/A 06/15/2017   Procedure: ESOPHAGOGASTRODUODENOSCOPY (EGD) WITH PROPOFOL;  Surgeon: [name], MD;  Location: [facility] ENDOSCOPY;  Service: Gastroenterology;  Laterality: N/A;  . TONSILLECTOMY      No Known Allergies  Prior to Admission medications   Medication Sig Start Date End Date Taking? Authorizing Provider  acetaminophen (TYLENOL) 500 MG tablet Take 500 mg by mouth every 6 (six) hours as needed.   Yes [provider]  Prenatal Vit-Fe Fumarate-FA (PRENATAL VITAMIN PO) Take 1 tablet by mouth daily.   Yes [provider]  vitamin  B-12 (CYANOCOBALAMIN) 1000 MCG tablet Take 1,000 mcg by mouth daily.   Yes [provider]  dicyclomine (BENTYL) 20 MG tablet Take 1 tablet (20 mg total) by mouth 3 (three) times daily as needed for spasms. 04/15/17 01/30/19  Merlyn Lot, MD  ferrous sulfate 325 (65 FE) MG tablet Take 1 tablet (325 mg total) by mouth 2 (two) times daily with a meal. Patient not taking: Reported on 01/30/2019 05/08/17 08/06/17  [name], MD  omeprazole (PRILOSEC) 40 MG capsule Take 1 capsule (40 mg total) by mouth daily. Patient not taking: Reported on 02/27/2019 03/15/17   [name], MD  promethazine (PHENERGAN) 12.5 MG tablet Take 1 tablet (12.5 mg total) by mouth every 8 (eight) hours as needed for nausea or vomiting. Patient not taking: Reported on 02/27/2019 01/22/19   Delman Kitten, MD     Social History: She  reports that she quit smoking about 5 months ago. She has never used smokeless tobacco. She reports current drug use. Drug: Marijuana. She reports that she does not drink alcohol.  Family History: family history includes Hypertension in her maternal grandmother and mother; Sickle cell anemia in her sister; Sickle cell trait in her mother; Thyroid disease in her mother.   Review of Systems: A full review of systems was performed and negative except as noted in the HPI.     O:  BP 133/64 (BP Location: Right Arm)   Pulse (!) 104   Temp 98.7 F (37.1 C) (Oral)   Resp 18   Ht 5' (1.524 m)   Wt 100.7 kg   LMP 09/04/2018   SpO2  99%   BMI 43.36 kg/m  Results for orders placed or performed during the hospital encounter of 02/27/19 (from the past 48 hour(s))  Urinalysis, Complete w Microscopic   Collection Time: 02/27/19  4:20 PM  Result Value Ref Range   Color, Urine YELLOW (A) YELLOW   APPearance CLEAR (A) CLEAR   Specific Gravity, Urine 1.014 1.005 - 1.030   pH 6.0 5.0 - 8.0   Glucose, UA NEGATIVE NEGATIVE mg/dL   Hgb urine dipstick NEGATIVE NEGATIVE   Bilirubin Urine NEGATIVE  NEGATIVE   Ketones, ur 80 (A) NEGATIVE mg/dL   Protein, ur NEGATIVE NEGATIVE mg/dL   Nitrite POSITIVE (A) NEGATIVE   Leukocytes,Ua TRACE (A) NEGATIVE   WBC, UA 0-5 0 - 5 WBC/hpf   Bacteria, UA RARE (A) NONE SEEN   Squamous Epithelial / LPF 0-5 0 - 5   Mucus PRESENT      Constitutional: NAD, AAOx3, appears quite comfortable  HE/ENT: extraocular movements grossly intact, moist mucous membranes CV: RRR PULM: normal respiratory effort, CTABL     Abd: gravid, non-tender, non-distended, soft  Back: no CVAT     Ext: Non-tender, Nonedmeatous   Psych: mood appropriate, speech normal Pelvic: small amount of white discharge in vagina, cervix visually closed  NST:  Baseline: 145bpm Variability: moderate Accelerations: none Decelerations: none Time: 68mins Toco: quiet   A/P: 23 y.o. [redacted]w[redacted]d here for antenatal surveillance during pregnancy.  Principle diagnosis: ligament discomfort during pregnancy, urinary tract infection  Labor  Not present  Fetal Wellbeing  Reassuring for GA  UA with trace leukocytes and positive nitrites. Will treat for UTI with Bactrim-DS BID x 3 days. Culture added.  Abdominal pain  Pain likely round ligament pain or related to UTI. Advised adequate hydrate with at least 60 ounces of fluid daily, Tylenol PRN, and pregnancy support band. Advised heat application for lower back discomfort.   Wet prep and GC/CT swab collected. Will follow-up with patient by phone to arrange treatment if indicated.   D/c home stable, precautions reviewed, follow-up as scheduled.    [name] 02/27/2019 5:20 PM  ----- [name], CNM Certified Nurse Midwife [facility], Department of [facility]

## 2019-02-27 NOTE — Progress Notes (Signed)
Fetal monitoring removed from patient per verbal order from provider, [place]. Continuing toco monitoring.

## 2019-02-27 NOTE — Discharge Summary (Signed)
RN reviewed discharge instructions with pt. Allowed opportunity for pt questions. All questions answered at this time. Pt verbalized understanding. Pt discharged home

## 2019-02-27 NOTE — OB Triage Note (Signed)
Pt presents c/o  intermittent lower abdominal pain that radiates to her back that started around 10:30 am unrelieved by tylenol. Denies any LOF or bleeding. Denies recent intercourse. Reports eating and drinking enough fluids today. Reports positive fetal movement. Vitals WNL. Will continue to monitor.

## 2019-02-28 LAB — URINE CULTURE

## 2019-03-05 ENCOUNTER — Other Ambulatory Visit: Payer: Self-pay

## 2019-03-06 ENCOUNTER — Inpatient Hospital Stay: Payer: Medicaid Other

## 2019-03-07 ENCOUNTER — Inpatient Hospital Stay: Payer: Medicaid Other

## 2019-03-07 ENCOUNTER — Other Ambulatory Visit: Payer: Medicaid Other

## 2019-03-12 ENCOUNTER — Other Ambulatory Visit: Payer: Self-pay

## 2019-03-13 ENCOUNTER — Encounter: Payer: Self-pay | Admitting: Oncology

## 2019-03-13 ENCOUNTER — Other Ambulatory Visit: Payer: Self-pay

## 2019-03-13 ENCOUNTER — Inpatient Hospital Stay: Payer: Medicaid Other

## 2019-03-13 ENCOUNTER — Telehealth: Payer: Self-pay | Admitting: *Deleted

## 2019-03-13 ENCOUNTER — Inpatient Hospital Stay: Payer: Medicaid Other | Attending: Oncology | Admitting: Oncology

## 2019-03-13 VITALS — BP 105/69 | HR 94 | Temp 99.2°F | Resp 16 | Ht 60.0 in | Wt 223.0 lb

## 2019-03-13 DIAGNOSIS — D509 Iron deficiency anemia, unspecified: Secondary | ICD-10-CM | POA: Insufficient documentation

## 2019-03-13 DIAGNOSIS — O99012 Anemia complicating pregnancy, second trimester: Secondary | ICD-10-CM | POA: Insufficient documentation

## 2019-03-13 DIAGNOSIS — Z87891 Personal history of nicotine dependence: Secondary | ICD-10-CM | POA: Insufficient documentation

## 2019-03-13 DIAGNOSIS — O99019 Anemia complicating pregnancy, unspecified trimester: Secondary | ICD-10-CM

## 2019-03-13 DIAGNOSIS — Z3A21 21 weeks gestation of pregnancy: Secondary | ICD-10-CM | POA: Diagnosis not present

## 2019-03-13 LAB — FERRITIN: Ferritin: 10 ng/mL — ABNORMAL LOW (ref 11–307)

## 2019-03-13 LAB — CBC WITH DIFFERENTIAL/PLATELET
Abs Immature Granulocytes: 0.05 10*3/uL (ref 0.00–0.07)
Basophils Absolute: 0 10*3/uL (ref 0.0–0.1)
Basophils Relative: 0 %
Eosinophils Absolute: 0.2 10*3/uL (ref 0.0–0.5)
Eosinophils Relative: 2 %
HCT: 31.9 % — ABNORMAL LOW (ref 36.0–46.0)
Hemoglobin: 10.9 g/dL — ABNORMAL LOW (ref 12.0–15.0)
Immature Granulocytes: 1 %
Lymphocytes Relative: 21 %
Lymphs Abs: 2.3 10*3/uL (ref 0.7–4.0)
MCH: 23.3 pg — ABNORMAL LOW (ref 26.0–34.0)
MCHC: 34.2 g/dL (ref 30.0–36.0)
MCV: 68.3 fL — ABNORMAL LOW (ref 80.0–100.0)
Monocytes Absolute: 0.8 10*3/uL (ref 0.1–1.0)
Monocytes Relative: 7 %
Neutro Abs: 7.8 10*3/uL — ABNORMAL HIGH (ref 1.7–7.7)
Neutrophils Relative %: 69 %
Platelets: 274 10*3/uL (ref 150–400)
RBC: 4.67 MIL/uL (ref 3.87–5.11)
RDW: 17.2 % — ABNORMAL HIGH (ref 11.5–15.5)
WBC: 11.1 10*3/uL — ABNORMAL HIGH (ref 4.0–10.5)
nRBC: 0 % (ref 0.0–0.2)

## 2019-03-13 LAB — IRON AND TIBC
Iron: 44 ug/dL (ref 28–170)
Saturation Ratios: 9 % — ABNORMAL LOW (ref 10.4–31.8)
TIBC: 487 ug/dL — ABNORMAL HIGH (ref 250–450)
UIBC: 443 ug/dL

## 2019-03-13 NOTE — Telephone Encounter (Signed)
Called and left message on voicemail that patient hgb lower than it was a month ago and the poral iron pills are not helping so pt. Will need iv iron infusions. She will get total of 5 and the patient will 2 infusions a week for 2 weeks and on 3rd week just 1 and it will be total of 5. I will have scheduler to set them up and if she has questions just let us know

## 2019-03-13 NOTE — Progress Notes (Signed)
She is tired when she sits down and gets still she will sleep. She is on last trimester of pregnancy so not sure if that is having something to do with energy. She is tolerating the iron pills

## 2019-03-14 DIAGNOSIS — D509 Iron deficiency anemia, unspecified: Secondary | ICD-10-CM | POA: Insufficient documentation

## 2019-03-14 NOTE — Progress Notes (Signed)
Hematology/Oncology Consult note [facility]  [phone] [phone]  Patient Care Team: Patient, No Pcp Per as PCP - General (General Practice)   Name of the patient: Cindy [name]  [id number]  [birth year]   Date of visit: 03/14/19  Diagnosis-iron deficiency anemia in pregnancy  Chief complaint/ Reason for visit-discuss results of blood work  Heme/Onc history: Patient is a 23 year old African-American female G2, P1 L1.  She is currently [redacted] weeks pregnant.  She has a history of sickle cell trait and reports that this was tested in her childhood.  She does not have overt sickle cell disease and has not had any sickle cell crisis in the past.  Her first pregnancy was complicated by iron deficiency anemia and neonatal aspiration.  Patient sister has sickle cell disease and mother has sickle cell trait.  So far her pregnancy has been coming along uneventfully.  She reports mild fatigue but denies other complaints.  She is taking her prenatal vitamins but has not taken any oral iron separately.  Denies any gum bleeds, nosebleeds or bleeding in her stool or urine.  Denies any vaginal spotting   Most recent CBC from 12/09/2018 showed hemoglobin of 12.1.  Platelets 372.  Labs from 01/16/2019 showed white count of 10.1, H&H of 11.6/35.8 with an MCV of 70 and a platelet count of 316.  LFTs were within normal limits.  Results of blood work From 01/30/2019 were as follows: H&H 11.2/32 with an MCV of 65.7.  Ferritin levels were low at 8.  Iron study showed a low iron saturation of 8% and elevated TIBC of 516.  B12 levels were normal at 302.  Interval history-she is currently [redacted] weeks pregnant.  Reports intermittent fatigue.  Denies any blood in her stool or urine  Pain scale- 0 Opioid associated constipation- no  Review of systems- Review of Systems  Constitutional: Positive for malaise/fatigue. Negative for chills, fever and weight loss.  HENT: Negative  for congestion, ear discharge and nosebleeds.   Eyes: Negative for blurred vision.  Respiratory: Negative for cough, hemoptysis, sputum production, shortness of breath and wheezing.   Cardiovascular: Negative for chest pain, palpitations, orthopnea and claudication.  Gastrointestinal: Negative for abdominal pain, blood in stool, constipation, diarrhea, heartburn, melena, nausea and vomiting.  Genitourinary: Negative for dysuria, flank pain, frequency, hematuria and urgency.  Musculoskeletal: Negative for back pain, joint pain and myalgias.  Skin: Negative for rash.  Neurological: Negative for dizziness, tingling, focal weakness, seizures, weakness and headaches.  Endo/Heme/Allergies: Does not bruise/bleed easily.  Psychiatric/Behavioral: Negative for depression and suicidal ideas. The patient does not have insomnia.        No Known Allergies   Past Medical History:  Diagnosis Date  . Anemia   . Depression    is past hx but not active now per pt  . Morbidly obese ([place])    notes from health dept as hx     Past Surgical History:  Procedure Laterality Date  . COLONOSCOPY WITH PROPOFOL N/A 06/15/2017   Procedure: COLONOSCOPY WITH PROPOFOL;  Surgeon: [name], MD;  Location: [facility] ENDOSCOPY;  Service: Gastroenterology;  Laterality: N/A;  . ESOPHAGOGASTRODUODENOSCOPY (EGD) WITH PROPOFOL N/A 06/15/2017   Procedure: ESOPHAGOGASTRODUODENOSCOPY (EGD) WITH PROPOFOL;  Surgeon: [name], MD;  Location: [facility] ENDOSCOPY;  Service: Gastroenterology;  Laterality: N/A;  . TONSILLECTOMY      Social History   Socioeconomic History  . Marital status: Significant Other    Spouse name: Not on file  .  Number of children: 1  . Years of education: Not on file  . Highest education level: Not on file  Occupational History  . Not on file  Social Needs  . Financial resource strain: Not on file  . Food insecurity:    Worry: Not on file    Inability: Not on file  . Transportation needs:     Medical: Not on file    Non-medical: Not on file  Tobacco Use  . Smoking status: Former Smoker    Last attempt to quit: 09/2018    Years since quitting: 0.4  . Smokeless tobacco: Never Used  Substance and Sexual Activity  . Alcohol use: No    Comment: former  . Drug use: Yes    Types: Marijuana    Comment: last used in sept  . Sexual activity: Yes    Partners: Male    Birth control/protection: Injection  Lifestyle  . Physical activity:    Days per week: Not on file    Minutes per session: Not on file  . Stress: Not on file  Relationships  . Social connections:    Talks on phone: Not on file    Gets together: Not on file    Attends religious service: Not on file    Active member of club or organization: Not on file    Attends meetings of clubs or organizations: Not on file    Relationship status: Not on file  . Intimate partner violence:    Fear of current or ex partner: Not on file    Emotionally abused: Not on file    Physically abused: Not on file    Forced sexual activity: Not on file  Other Topics Concern  . Not on file  Social History Narrative  . Not on file    Family History  Problem Relation Age of Onset  . Thyroid disease Mother   . Hypertension Mother   . Sickle cell trait Mother   . Sickle cell anemia Sister   . Hypertension Maternal Grandmother      Current Outpatient Medications:  .  acetaminophen (TYLENOL) 500 MG tablet, Take 2 tablets (1,000 mg total) by mouth every 6 (six) hours as needed for mild pain or moderate pain., Disp: 60 tablet, Rfl: 0 .  ferrous sulfate 325 (65 FE) MG tablet, Take 1 tablet (325 mg total) by mouth 2 (two) times daily with a meal., Disp: 180 tablet, Rfl: 0 .  Prenatal Vit-Fe Fumarate-FA (PRENATAL VITAMIN PO), Take 1 tablet by mouth daily., Disp: , Rfl:  .  vitamin B-12 (CYANOCOBALAMIN) 1000 MCG tablet, Take 1,000 mcg by mouth daily., Disp: , Rfl:   Physical exam:  Vitals:   03/13/19 1432  BP: 105/69  Pulse: 94   Resp: 16  Temp: 99.2 F (37.3 C)  TempSrc: Tympanic  Weight: 223 lb (101.2 kg)  Height: 5' (1.524 m)   Physical Exam Constitutional:      General: She is not in acute distress. HENT:     Head: Normocephalic and atraumatic.  Eyes:     Pupils: Pupils are equal, round, and reactive to light.  Neck:     Musculoskeletal: Normal range of motion.  Cardiovascular:     Rate and Rhythm: Normal rate and regular rhythm.     Heart sounds: Normal heart sounds.  Pulmonary:     Effort: Pulmonary effort is normal.     Breath sounds: Normal breath sounds.  Abdominal:     Comments: Gravid uterus  Skin:    General: Skin is warm and dry.  Neurological:     Mental Status: She is alert and oriented to person, place, and time.      CMP Latest Ref Rng & Units 01/22/2019  Glucose 70 - 99 mg/dL 108(H)  BUN 6 - 20 mg/dL 5(L)  Creatinine 0.44 - 1.00 mg/dL 0.46  Sodium 135 - 145 mmol/L 137  Potassium 3.5 - 5.1 mmol/L 3.8  Chloride 98 - 111 mmol/L 106  CO2 22 - 32 mmol/L 20(L)  Calcium 8.9 - 10.3 mg/dL 8.7(L)  Total Protein 6.5 - 8.1 g/dL 6.8  Total Bilirubin 0.3 - 1.2 mg/dL 0.6  Alkaline Phos 38 - 126 U/L 62  AST 15 - 41 U/L 23  ALT 0 - 44 U/L 21   CBC Latest Ref Rng & Units 03/13/2019  WBC 4.0 - 10.5 K/uL 11.1(H)  Hemoglobin 12.0 - 15.0 g/dL 10.9(L)  Hematocrit 36.0 - 46.0 % 31.9(L)  Platelets 150 - 400 K/uL 274    Assessment and plan- Patient is a 23 y.o. female currently in her second trimester of pregnancy here to discuss the results of her blood work  We did do a repeat hemoglobin today which shows that her hemoglobin is now drifted down to 10.9.  Previous labs which I discussed with the patient are consistent with iron deficiency.  Patient is started taking oral iron but given that she is about to enter her third trimester and her hemoglobin is somewhat lower at this time I have recommended that she would need IV iron.  I would recommend 5 doses of Venofer 200 mg over 3 weeks.  Discussed  risks and benefits of IV iron including all but not limited to headache, leg swelling and possible risk of infusion reaction.  Patient understands and agrees to proceed as planned.  I will see her back in 8 weeks with repeat CBC ferritin and iron studies   Visit Diagnosis 1. Anemia affecting pregnancy, antepartum   2. Iron deficiency anemia, unspecified iron deficiency anemia type      Dr. Randa Evens, MD, MPH [facility] [phone] 03/14/2019 1:13 PM

## 2019-03-17 ENCOUNTER — Other Ambulatory Visit: Payer: Self-pay

## 2019-03-18 ENCOUNTER — Inpatient Hospital Stay: Payer: Medicaid Other

## 2019-03-21 ENCOUNTER — Inpatient Hospital Stay: Payer: Medicaid Other | Attending: Oncology

## 2019-03-21 ENCOUNTER — Other Ambulatory Visit: Payer: Self-pay

## 2019-03-21 VITALS — BP 106/69 | HR 91 | Resp 20

## 2019-03-21 DIAGNOSIS — D509 Iron deficiency anemia, unspecified: Secondary | ICD-10-CM | POA: Diagnosis present

## 2019-03-21 MED ORDER — SODIUM CHLORIDE 0.9 % IV SOLN
Freq: Once | INTRAVENOUS | Status: AC
  Administered 2019-03-21: 14:00:00 via INTRAVENOUS
  Filled 2019-03-21: qty 250

## 2019-03-21 MED ORDER — IRON SUCROSE 20 MG/ML IV SOLN
200.0000 mg | Freq: Once | INTRAVENOUS | Status: AC
  Administered 2019-03-21: 200 mg via INTRAVENOUS
  Filled 2019-03-21: qty 10

## 2019-03-24 ENCOUNTER — Observation Stay
Admission: EM | Admit: 2019-03-24 | Discharge: 2019-03-24 | Disposition: A | Discharge status: Home / Self Care | Payer: Medicaid Other | Attending: Obstetrics and Gynecology | Admitting: Obstetrics and Gynecology

## 2019-03-24 ENCOUNTER — Inpatient Hospital Stay: Payer: Medicaid Other

## 2019-03-24 ENCOUNTER — Other Ambulatory Visit: Payer: Self-pay

## 2019-03-24 DIAGNOSIS — Z87891 Personal history of nicotine dependence: Secondary | ICD-10-CM | POA: Insufficient documentation

## 2019-03-24 DIAGNOSIS — O368190 Decreased fetal movements, unspecified trimester, not applicable or unspecified: Secondary | ICD-10-CM | POA: Diagnosis present

## 2019-03-24 DIAGNOSIS — F329 Major depressive disorder, single episode, unspecified: Secondary | ICD-10-CM | POA: Diagnosis not present

## 2019-03-24 DIAGNOSIS — O99213 Obesity complicating pregnancy, third trimester: Secondary | ICD-10-CM | POA: Diagnosis present

## 2019-03-24 DIAGNOSIS — Z3A28 28 weeks gestation of pregnancy: Secondary | ICD-10-CM | POA: Diagnosis not present

## 2019-03-24 DIAGNOSIS — O99343 Other mental disorders complicating pregnancy, third trimester: Secondary | ICD-10-CM | POA: Insufficient documentation

## 2019-03-24 LAB — URINALYSIS, ROUTINE W REFLEX MICROSCOPIC
Bacteria, UA: NONE SEEN
Bilirubin Urine: NEGATIVE
Glucose, UA: NEGATIVE mg/dL
Hgb urine dipstick: NEGATIVE
Ketones, ur: NEGATIVE mg/dL
Nitrite: NEGATIVE
Protein, ur: 30 mg/dL — AB
Specific Gravity, Urine: 1.023 (ref 1.005–1.030)
pH: 7 (ref 5.0–8.0)

## 2019-03-24 NOTE — Progress Notes (Signed)
Patient ID: ANANYA MCCLEESE, female   DOB: 09-Apr-1996, 23 y.o.   MRN: [id number]  ALTHEIA SHAFRAN is a 23 y.o. female. She is at [redacted]w[redacted]d gestation. Patient's last menstrual period was 09/04/2018. Estimated Date of Delivery: 06/11/19  Prenatal care site: [facility]   Chief complaint:decreased fetal movt S: Resting comfortably. no CTX, no VB.no LOF,  Active fetal movement.   Maternal Medical History:   Past Medical History:  Diagnosis Date  . Anemia   . Depression    is past hx but not active now per pt  . Morbidly obese ([place])    notes from health dept as hx    Past Surgical History:  Procedure Laterality Date  . COLONOSCOPY WITH PROPOFOL N/A 06/15/2017   Procedure: COLONOSCOPY WITH PROPOFOL;  Surgeon: [name], MD;  Location: [facility] ENDOSCOPY;  Service: Gastroenterology;  Laterality: N/A;  . ESOPHAGOGASTRODUODENOSCOPY (EGD) WITH PROPOFOL N/A 06/15/2017   Procedure: ESOPHAGOGASTRODUODENOSCOPY (EGD) WITH PROPOFOL;  Surgeon: [name], MD;  Location: [facility] ENDOSCOPY;  Service: Gastroenterology;  Laterality: N/A;  . TONSILLECTOMY      No Known Allergies  Prior to Admission medications   Medication Sig Start Date End Date Taking? Authorizing Provider  acetaminophen (TYLENOL) 500 MG tablet Take 2 tablets (1,000 mg total) by mouth every 6 (six) hours as needed for mild pain or moderate pain. 02/27/19  Yes [name], CNM  Prenatal Vit-Fe Fumarate-FA (PRENATAL VITAMIN PO) Take 1 tablet by mouth daily.   Yes [provider]  ferrous sulfate 325 (65 FE) MG tablet Take 1 tablet (325 mg total) by mouth 2 (two) times daily with a meal. 05/08/17 03/13/19  [name], MD  vitamin B-12 (CYANOCOBALAMIN) 1000 MCG tablet Take 1,000 mcg by mouth daily.    [provider]     Social History: She  reports that she quit smoking about 6 months ago. She has never used smokeless tobacco. She reports previous drug use. Drug: Marijuana. She reports that she does not drink alcohol.  Family  History: family history includes Hypertension in her maternal grandmother and mother; Sickle cell anemia in her sister; Sickle cell trait in her mother; Thyroid disease in her mother.  no history of gyn cancers  Review of Systems: I.   Eyes: no vision change  Ears: left ear pain  Oropharynx: no sore throat  Pulmonary . No shortness of breath , no hemoptysis Cardiovascular: no chest pain , no irregular heart beat  Gastrointestinal:no blood in stool . No diarrhea, no constipation Uro gynecologic: no dysuria , no pelvic pain Neurologic : no seizure , no migraines    Musculoskeletal: no muscular weakness  O:  BP (!) 110/56   Pulse 86   Temp 98 F (36.7 C) (Oral)   Resp 19   Ht 5' (1.524 m)   Wt 101.2 kg   LMP 09/04/2018   BMI 43.55 kg/m  Results for orders placed or performed during the hospital encounter of 03/24/19 (from the past 48 hour(s))  Urinalysis, Routine w reflex microscopic   Collection Time: 03/24/19 10:15 AM  Result Value Ref Range   Color, Urine YELLOW (A) YELLOW   APPearance CLOUDY (A) CLEAR   Specific Gravity, Urine 1.023 1.005 - 1.030   pH 7.0 5.0 - 8.0   Glucose, UA NEGATIVE NEGATIVE mg/dL   Hgb urine dipstick NEGATIVE NEGATIVE   Bilirubin Urine NEGATIVE NEGATIVE   Ketones, ur NEGATIVE NEGATIVE mg/dL   Protein, ur 30 (A) NEGATIVE mg/dL   Nitrite NEGATIVE NEGATIVE  Leukocytes,Ua LARGE (A) NEGATIVE   RBC / HPF 0-5 0 - 5 RBC/hpf   WBC, UA 6-10 0 - 5 WBC/hpf   Bacteria, UA NONE SEEN NONE SEEN   Squamous Epithelial / LPF 21-50 0 - 5   Mucus PRESENT    Hyaline Casts, UA PRESENT    Ca Oxalate Crys, UA PRESENT      Constitutional: NAD, AAOx3  HE/ENT: extraocular movements grossly intact, moist mucous membranes CV: RRR PULM: nl respiratory effort, CTABL     Abd: gravid, non-tender, non-distended, soft      Ext: Non-tender, Nonedmeatous   Psych: mood appropriate, speech normal Pelvic deferred  NST: Reactive  Baseline: 130 Variability:  moderate Accelerations present x >2 Decelerations absent Time 59mins    Assessment: 23 y.o. [redacted]w[redacted]d here for antenatal surveillance during pregnancy. Decreased fetal movt . Reassuring fetal monitoring . Mother felt multiple movt per RN after eating  Principle diagnosis:   Plan: D/c home with precautions  ----- T Carlee Vonderhaar MD Attending Obstetrician and Gynecologist [facility], Department of [facility]

## 2019-03-24 NOTE — OB Triage Note (Signed)
Patient presented to L&D with complaints of decreased fetal movements and sharp pains in lower abdomen since yesterday. Also states she had an episode of nausea/ vomiting this morning.  "tried to eat breakfast but threw it up"  Denies LOF, vaginal bleeding or recent intercourse. Vital signs WNL. Will continue to monitor

## 2019-03-24 NOTE — Discharge Summary (Signed)
Patient ID: Cindy [name], female   DOB: Apr 25, 1996, 23 y.o.   MRN: [id number]  Subjective   Cindy [name] is a 23 y.o. female. She is at [redacted]w[redacted]d gestation. Patient's last menstrual period was 09/04/2018. Estimated Date of Delivery: 06/11/19  Prenatal care site: [facility]   Chief complaint:decreased fetal movt S: Resting comfortably. noCTX, no VB.no LOF, Active fetal movement.   Maternal Medical History:       Past Medical History:  Diagnosis Date  . Anemia   . Depression    is past hx but not active now per pt  . Morbidly obese ([place])    notes from health dept as hx         Past Surgical History:  Procedure Laterality Date  . COLONOSCOPY WITH PROPOFOL N/A 06/15/2017   Procedure: COLONOSCOPY WITH PROPOFOL;  Surgeon: [name], MD;  Location: [facility] ENDOSCOPY;  Service: Gastroenterology;  Laterality: N/A;  . ESOPHAGOGASTRODUODENOSCOPY (EGD) WITH PROPOFOL N/A 06/15/2017   Procedure: ESOPHAGOGASTRODUODENOSCOPY (EGD) WITH PROPOFOL;  Surgeon: [name], MD;  Location: [facility] ENDOSCOPY;  Service: Gastroenterology;  Laterality: N/A;  . TONSILLECTOMY      No Known Allergies         Prior to Admission medications   Medication Sig Start Date End Date Taking? Authorizing Provider  acetaminophen (TYLENOL) 500 MG tablet Take 2 tablets (1,000 mg total) by mouth every 6 (six) hours as needed for mild pain or moderate pain. 02/27/19  Yes [name], CNM  Prenatal Vit-Fe Fumarate-FA (PRENATAL VITAMIN PO) Take 1 tablet by mouth daily.   Yes [provider]  ferrous sulfate 325 (65 FE) MG tablet Take 1 tablet (325 mg total) by mouth 2 (two) times daily with a meal. 05/08/17 03/13/19  [name], MD  vitamin B-12 (CYANOCOBALAMIN) 1000 MCG tablet Take 1,000 mcg by mouth daily.    [provider]     Social History: She  reports that she quit smoking about 6 months ago. She has never used smokeless tobacco. She reports previous drug use. Drug:  Marijuana. She reports that she does not drink alcohol.  Family History: family history includes Hypertension in her maternal grandmother and mother; Sickle cell anemia in her sister; Sickle cell trait in her mother; Thyroid disease in her mother.  no history of gyn cancers  Review of Systems: I.  Eyes: no vision change  Ears: left ear pain  Oropharynx: no sore throat  Pulmonary . No shortness of breath , no hemoptysis Cardiovascular: no chest pain , no irregular heart beat  Gastrointestinal:no blood in stool . No diarrhea, no constipation Uro gynecologic: no dysuria , no pelvic pain Neurologic : no seizure , no migraines  Musculoskeletal: no muscular weakness  O:  Objective   BP (!) 110/56   Pulse 86   Temp 98 F (36.7 C) (Oral)   Resp 19   Ht 5' (1.524 m)   Wt 101.2 kg   LMP 09/04/2018   BMI 43.55 kg/m       Results for orders placed or performed during the hospital encounter of 03/24/19 (from the past 48 hour(s))  Urinalysis, Routine w reflex microscopic   Collection Time: 03/24/19 10:15 AM  Result Value Ref Range   Color, Urine YELLOW (A) YELLOW   APPearance CLOUDY (A) CLEAR   Specific Gravity, Urine 1.023 1.005 - 1.030   pH 7.0 5.0 - 8.0   Glucose, UA NEGATIVE NEGATIVE mg/dL   Hgb urine dipstick NEGATIVE NEGATIVE   Bilirubin Urine NEGATIVE NEGATIVE  Ketones, ur NEGATIVE NEGATIVE mg/dL   Protein, ur 30 (A) NEGATIVE mg/dL   Nitrite NEGATIVE NEGATIVE   Leukocytes,Ua LARGE (A) NEGATIVE   RBC / HPF 0-5 0 - 5 RBC/hpf   WBC, UA 6-10 0 - 5 WBC/hpf   Bacteria, UA NONE SEEN NONE SEEN   Squamous Epithelial / LPF 21-50 0 - 5   Mucus PRESENT    Hyaline Casts, UA PRESENT    Ca Oxalate Crys, UA PRESENT     Constitutional: NAD, AAOx3  HE/ENT: extraocular movements grossly intact, moist mucous membranes CV: RRR PULM: nl respiratory effort, CTABL                                         Abd: gravid, non-tender, non-distended, soft                                                   Ext: Non-tender, Nonedmeatous                     Psych: mood appropriate, speech normal Pelvic deferred  NST: Reactive  Baseline: 130 Variability: moderate Accelerations present x >2 Decelerations absent Time 17mins    Assessment: 23 y.o. [redacted]w[redacted]d here for antenatal surveillance during pregnancy. Decreased fetal movt . Reassuring fetal monitoring . Mother felt multiple movt per RN after eating  Principle diagnosis:   Plan: D/c home with precautions  ----- T Taimane Stimmel MD Attending Obstetrician and Gynecologist [facility], Department of [facility]

## 2019-03-26 ENCOUNTER — Other Ambulatory Visit: Payer: Self-pay

## 2019-03-27 ENCOUNTER — Inpatient Hospital Stay: Payer: Medicaid Other

## 2019-03-27 ENCOUNTER — Other Ambulatory Visit: Payer: Self-pay

## 2019-03-27 VITALS — BP 113/72 | HR 92 | Temp 97.4°F | Resp 18

## 2019-03-27 DIAGNOSIS — D509 Iron deficiency anemia, unspecified: Secondary | ICD-10-CM

## 2019-03-27 MED ORDER — SODIUM CHLORIDE 0.9 % IV SOLN
Freq: Once | INTRAVENOUS | Status: AC
  Administered 2019-03-27: 15:00:00 via INTRAVENOUS
  Filled 2019-03-27: qty 250

## 2019-03-27 MED ORDER — IRON SUCROSE 20 MG/ML IV SOLN
200.0000 mg | Freq: Once | INTRAVENOUS | Status: AC
  Administered 2019-03-27: 200 mg via INTRAVENOUS
  Filled 2019-03-27: qty 10

## 2019-03-31 ENCOUNTER — Inpatient Hospital Stay: Payer: Medicaid Other

## 2019-04-03 ENCOUNTER — Inpatient Hospital Stay: Payer: Medicaid Other

## 2019-04-03 ENCOUNTER — Other Ambulatory Visit: Payer: Self-pay

## 2019-04-03 VITALS — BP 105/67 | HR 88 | Temp 97.6°F | Resp 20

## 2019-04-03 DIAGNOSIS — D509 Iron deficiency anemia, unspecified: Secondary | ICD-10-CM

## 2019-04-03 MED ORDER — IRON SUCROSE 20 MG/ML IV SOLN
200.0000 mg | Freq: Once | INTRAVENOUS | Status: AC
  Administered 2019-04-03: 200 mg via INTRAVENOUS
  Filled 2019-04-03: qty 10

## 2019-04-03 MED ORDER — SODIUM CHLORIDE 0.9 % IV SOLN
Freq: Once | INTRAVENOUS | Status: AC
  Administered 2019-04-03: 14:00:00 via INTRAVENOUS
  Filled 2019-04-03: qty 250

## 2019-04-07 ENCOUNTER — Ambulatory Visit: Payer: Medicaid Other

## 2019-04-08 DIAGNOSIS — D582 Other hemoglobinopathies: Secondary | ICD-10-CM | POA: Insufficient documentation

## 2019-04-08 DIAGNOSIS — F5089 Other specified eating disorder: Secondary | ICD-10-CM | POA: Insufficient documentation

## 2019-04-08 DIAGNOSIS — O99210 Obesity complicating pregnancy, unspecified trimester: Secondary | ICD-10-CM | POA: Insufficient documentation

## 2019-04-08 DIAGNOSIS — Z6841 Body Mass Index (BMI) 40.0 and over, adult: Secondary | ICD-10-CM | POA: Insufficient documentation

## 2019-04-08 DIAGNOSIS — Z6730 Type AB blood, Rh positive: Secondary | ICD-10-CM | POA: Insufficient documentation

## 2019-04-15 ENCOUNTER — Encounter: Payer: Self-pay | Admitting: Nurse Practitioner

## 2019-04-15 ENCOUNTER — Other Ambulatory Visit: Payer: Self-pay

## 2019-04-15 ENCOUNTER — Ambulatory Visit: Payer: Medicaid Other | Admitting: Nurse Practitioner

## 2019-04-15 VITALS — BP 109/69 | Temp 98.2°F | Wt 228.4 lb

## 2019-04-15 DIAGNOSIS — Z3483 Encounter for supervision of other normal pregnancy, third trimester: Secondary | ICD-10-CM

## 2019-04-15 LAB — HEMOGLOBIN: Hemoglobin: 10.8 g/dL — ABNORMAL LOW (ref 11.1–15.9)

## 2019-04-15 NOTE — Progress Notes (Addendum)
    PRENATAL VISIT NOTE  Subjective:  Cindy [name] is a 23 y.o. G2P1001 at [redacted]w[redacted]d being seen today for ongoing prenatal care.  She is currently monitored for the following issues for this high-risk pregnancy and has Postpartum care following vaginal delivery; Abdominal pain affecting pregnancy; Iron deficiency anemia; Decreased fetal movement; Morbid obesity with BMI of 40.0-44.9, adult ([place]); Hemoglobin C trait ([place]); Type AB blood, Rh positive; and Pica on their problem list.  Patient reports no complaints.   .  .   . denies vomiting, abdominal pain, fussiness, diarrhea, cough and difficulty breathing leaking of fluid/ROM.   The following portions of the patient's history were reviewed and updated as appropriate: allergies, current medications, past family history, past medical history, past social history, past surgical history and problem list. Problem list updated.  Objective:   Vitals:   04/15/19 1300  BP: 109/69  Temp: 98.2 F (36.8 C)  Weight: 228 lb 6.4 oz (103.6 kg)    Fetal Status:           General:  Alert, oriented and cooperative. Patient is in no acute distress.  Skin: Skin is warm and dry. No rash noted.   Cardiovascular: Normal heart rate noted  Respiratory: Normal respiratory effort, no problems with respiration noted  Abdomen: Soft, gravid, appropriate for gestational age.        Pelvic: Cervical exam deferred        Extremities: Normal range of motion.     Mental Status: Normal mood and affect. Normal behavior. Normal judgment and thought content.   Assessment and Plan:  Pregnancy: G2P1001 at [redacted]w[redacted]d  There are no diagnoses linked to this encounter.  Preterm labor symptoms and general obstetric precautions including but not limited to vaginal bleeding, contractions, leaking of fluid and fetal movement were reviewed in detail with the patient. Please refer to After Visit Summary for other counseling recommendations.  No follow-ups on file.  Future  Appointments  Date Time Provider [place]  05/08/2019  1:30 PM [facility] None  05/08/2019  2:00 PM Sindy Guadeloupe, MD [facility] None    Berniece Andreas, NP

## 2019-04-15 NOTE — Progress Notes (Signed)
Taking PNV QD, denies travel for self/partner, denies ED/hospital visits since last RV. Per patient last Iron infusion was 2 weeks ago, labs to be drawn at next visit to determine if will receive future Iron infusions. Kick Count cards and instructions given and explained.

## 2019-04-17 LAB — URINE CULTURE

## 2019-04-21 ENCOUNTER — Other Ambulatory Visit: Payer: Self-pay

## 2019-04-21 ENCOUNTER — Observation Stay
Admission: EM | Admit: 2019-04-21 | Discharge: 2019-04-21 | Disposition: A | Discharge status: Home / Self Care | Payer: Medicaid Other | Attending: Certified Nurse Midwife | Admitting: Certified Nurse Midwife

## 2019-04-21 DIAGNOSIS — O99013 Anemia complicating pregnancy, third trimester: Secondary | ICD-10-CM | POA: Insufficient documentation

## 2019-04-21 DIAGNOSIS — D509 Iron deficiency anemia, unspecified: Secondary | ICD-10-CM | POA: Diagnosis not present

## 2019-04-21 DIAGNOSIS — B9689 Other specified bacterial agents as the cause of diseases classified elsewhere: Secondary | ICD-10-CM

## 2019-04-21 DIAGNOSIS — Z87891 Personal history of nicotine dependence: Secondary | ICD-10-CM | POA: Insufficient documentation

## 2019-04-21 DIAGNOSIS — Z3A32 32 weeks gestation of pregnancy: Secondary | ICD-10-CM | POA: Insufficient documentation

## 2019-04-21 DIAGNOSIS — O26893 Other specified pregnancy related conditions, third trimester: Principal | ICD-10-CM | POA: Insufficient documentation

## 2019-04-21 DIAGNOSIS — O99213 Obesity complicating pregnancy, third trimester: Secondary | ICD-10-CM | POA: Diagnosis not present

## 2019-04-21 LAB — URINALYSIS, COMPLETE (UACMP) WITH MICROSCOPIC
Bilirubin Urine: NEGATIVE
Glucose, UA: NEGATIVE mg/dL
Hgb urine dipstick: NEGATIVE
Ketones, ur: NEGATIVE mg/dL
Leukocytes,Ua: NEGATIVE
Nitrite: NEGATIVE
Protein, ur: NEGATIVE mg/dL
Specific Gravity, Urine: 1.004 — ABNORMAL LOW (ref 1.005–1.030)
pH: 7 (ref 5.0–8.0)

## 2019-04-21 LAB — WET PREP, GENITAL
Sperm: NONE SEEN
Trich, Wet Prep: NONE SEEN
Yeast Wet Prep HPF POC: NONE SEEN

## 2019-04-21 LAB — CHLAMYDIA/NGC RT PCR (ARMC ONLY)
Chlamydia Tr: NOT DETECTED
N gonorrhoeae: NOT DETECTED

## 2019-04-21 LAB — FETAL FIBRONECTIN: Fetal Fibronectin: NEGATIVE

## 2019-04-21 MED ORDER — ACETAMINOPHEN 500 MG PO TABS: 1000.0000 mg | ORAL_TABLET | Freq: Four times a day (QID) | ORAL | Status: DC | PRN

## 2019-04-21 MED ORDER — METRONIDAZOLE 500 MG PO TABS: 500.0000 mg | ORAL_TABLET | Freq: Two times a day (BID) | ORAL | 0 refills | Status: AC

## 2019-04-21 NOTE — Discharge Summary (Signed)
TRIAGE NOTE to rule out Preterm Labor   History of Present Illness: Cindy [name] is a 23 y.o. G2P1001 at [redacted]w[redacted]d presenting to triage for rule out preterm labor. Pt reports contractions and vaginal pain   Patient reports the fetal movement as active. Patient reports uterine contraction  activity as regular, every 5 minutes, but resolving over monitoring time. Patient reports  vaginal bleeding as none. Patient describes fluid per vagina as None.  Patient Active Problem List   Diagnosis Date Noted  . Morbid obesity with BMI of 40.0-44.9, adult ([place]) 04/08/2019  . Hemoglobin C trait ([place]) 04/08/2019  . Type AB blood, Rh positive 04/08/2019  . Pica 04/08/2019  . Decreased fetal movement 03/24/2019  . Iron deficiency anemia 03/14/2019  . Abdominal pain affecting pregnancy 02/27/2019    Past Medical History:  Diagnosis Date  . Anemia   . Depression    is past hx but not active now per pt  . Morbidly obese ([place])    notes from health dept as hx    Past Surgical History:  Procedure Laterality Date  . COLONOSCOPY WITH PROPOFOL N/A 06/15/2017   Procedure: COLONOSCOPY WITH PROPOFOL;  Surgeon: [name], MD;  Location: [facility] ENDOSCOPY;  Service: Gastroenterology;  Laterality: N/A;  . ESOPHAGOGASTRODUODENOSCOPY (EGD) WITH PROPOFOL N/A 06/15/2017   Procedure: ESOPHAGOGASTRODUODENOSCOPY (EGD) WITH PROPOFOL;  Surgeon: [name], MD;  Location: [facility] ENDOSCOPY;  Service: Gastroenterology;  Laterality: N/A;  . TONSILLECTOMY      OB History  Gravida Para Term Preterm AB Living  2 1 1     1   SAB TAB Ectopic Multiple Live Births        0 1    # Outcome Date GA Lbr Len/2nd Weight Sex Delivery Anes PTL Lv  2 Current           1 Term 10/24/16 [redacted]w[redacted]d 07:53 / 00:07 3230 g F Vag-Spont EPI  LIV    Social History   Socioeconomic History  . Marital status: Significant Other    Spouse name: [employer]  . Number of children: 1  . Years of education: Not on file  . Highest education  level: 9th grade  Occupational History  . Not on file  Social Needs  . Financial resource strain: Not on file  . Food insecurity    Worry: Not on file    Inability: Not on file  . Transportation needs    Medical: Not on file    Non-medical: Not on file  Tobacco Use  . Smoking status: Former Smoker    Quit date: 09/2018    Years since quitting: 0.5  . Smokeless tobacco: Never Used  Substance and Sexual Activity  . Alcohol use: No    Comment: former  . Drug use: Not Currently    Types: Marijuana    Comment: last used in sept  . Sexual activity: Not Currently    Partners: Male    Birth control/protection: Injection  Lifestyle  . Physical activity    Days per week: Not on file    Minutes per session: Not on file  . Stress: Not on file  Relationships  . Social Herbalist on phone: Not on file    Gets together: Not on file    Attends religious service: Not on file    Active member of club or organization: Not on file    Attends meetings of clubs or organizations: Not on file    Relationship status: Not on  file  Other Topics Concern  . Not on file  Social History Narrative  . Not on file    Family History  Problem Relation Age of Onset  . Thyroid disease Mother   . Hypertension Mother   . Sickle cell trait Mother   . Sickle cell anemia Sister   . Hypertension Maternal Grandmother     No Known Allergies  Medications Prior to Admission  Medication Sig Dispense Refill Last Dose  . Prenatal Vit-Fe Phos-FA-Omega (VITAFOL GUMMIES PO) Take by mouth. 1 daily   04/20/2019 at Unknown time  . acetaminophen (TYLENOL) 500 MG tablet Take 2 tablets (1,000 mg total) by mouth every 6 (six) hours as needed for mild pain or moderate pain. (Patient not taking: Reported on 04/21/2019) 60 tablet 0 Not Taking at Unknown time  . ferrous sulfate 325 (65 FE) MG tablet Take 1 tablet (325 mg total) by mouth 2 (two) times daily with a meal. 180 tablet 0   . Prenatal Vit-Fe Fumarate-FA  (PRENATAL VITAMIN PO) Take 1 tablet by mouth daily.   Not Taking at Unknown time  . vitamin B-12 (CYANOCOBALAMIN) 1000 MCG tablet Take 1,000 mcg by mouth daily.   Not Taking at Unknown time    Review of Systems - See HPI for OB specific ROS.   Vitals:  BP 126/60 (BP Location: Left Arm)   Pulse 84   Temp 98 F (36.7 C) (Oral)   Resp 18   Ht 5' (1.524 m)   Wt 101.2 kg   LMP 09/04/2018   BMI 43.55 kg/m  Physical Examination: CONSTITUTIONAL: Well-developed, well-nourished female in no acute distress.  HENT:  Normocephalic, atraumatic EYES: Conjunctivae and EOM are normal. No scleral icterus.  NECK: Normal range of motion, supple, SKIN: Skin is warm and dry. No rash noted. Not diaphoretic. No erythema. No pallor. [place]: Alert and oriented to person, place, and time. No gross cranial nerve deficit noted. PSYCHIATRIC: Normal mood and affect. Normal behavior. Normal judgment and thought content. CARDIOVASCULAR: Normal heart rate noted, regular rhythm RESPIRATORY: Effort and breath sounds normal, no problems with respiration noted ABDOMEN: Soft, nontender, nondistended, gravid.  Cervix: 0.5/thick,high Membranes:intact Fetal Monitoring:Baseline: 130 bpm, Variability: Fair (1-6 bpm), Accelerations: Non-reactive but appropriate for gestational age and Decelerations: Absent Tocometer: occasional contractions   Labs:  Results for orders placed or performed during the hospital encounter of 04/21/19 (from the past 24 hour(s))  Wet prep, genital   Collection Time: 04/21/19  7:09 AM   Specimen: Cervix  Result Value Ref Range   Yeast Wet Prep HPF POC NONE SEEN NONE SEEN   Trich, Wet Prep NONE SEEN NONE SEEN   Clue Cells Wet Prep HPF POC PRESENT (A) NONE SEEN   WBC, Wet Prep HPF POC MANY (A) NONE SEEN   Sperm NONE SEEN   Chlamydia/NGC rt PCR ([facility] only)   Collection Time: 04/21/19  7:09 AM   Specimen: Cervix  Result Value Ref Range   Specimen source GC/Chlam ENDOCERVICAL    Chlamydia  Tr NOT DETECTED NOT DETECTED   N gonorrhoeae NOT DETECTED NOT DETECTED  Urinalysis, Complete w Microscopic   Collection Time: 04/21/19  7:09 AM  Result Value Ref Range   Color, Urine STRAW (A) YELLOW   APPearance CLEAR (A) CLEAR   Specific Gravity, Urine 1.004 (L) 1.005 - 1.030   pH 7.0 5.0 - 8.0   Glucose, UA NEGATIVE NEGATIVE mg/dL   Hgb urine dipstick NEGATIVE NEGATIVE   Bilirubin Urine NEGATIVE NEGATIVE   Ketones,  ur NEGATIVE NEGATIVE mg/dL   Protein, ur NEGATIVE NEGATIVE mg/dL   Nitrite NEGATIVE NEGATIVE   Leukocytes,Ua NEGATIVE NEGATIVE   RBC / HPF 0-5 0 - 5 RBC/hpf   WBC, UA 0-5 0 - 5 WBC/hpf   Bacteria, UA RARE (A) NONE SEEN   Squamous Epithelial / LPF 0-5 0 - 5  Fetal fibronectin   Collection Time: 04/21/19  7:09 AM  Result Value Ref Range   Fetal Fibronectin NEGATIVE NEGATIVE   Appearance, FETFIB CLEAR CLEAR    Imaging Studies: No results found.   Assessment and Plan: Patient Active Problem List   Diagnosis Date Noted  . Morbid obesity with BMI of 40.0-44.9, adult ([place]) 04/08/2019  . Hemoglobin C trait ([place]) 04/08/2019  . Type AB blood, Rh positive 04/08/2019  . Pica 04/08/2019  . Decreased fetal movement 03/24/2019  . Iron deficiency anemia 03/14/2019  . Abdominal pain affecting pregnancy 02/27/2019    1. Concern for preterm labor:  - Labs:  FFN negative, wet prep positive for clue cells, urinalysis neg for infection but +for rare bacteria - Continue fetal monitoring: NST non reactive but appropriate and not concerning or gestational age - Continuous toco with resolving contractions  Plan for discharge home with flagyl, f/u in office as scheduled. Precautions given.  Angelina Pih, MD, MPH

## 2019-04-21 NOTE — OB Triage Note (Signed)
Pt is a G2P1 seen at 32wk5d presents to L&D w/ complaints of abdominal pain and pelvic pressure that started around 0430 this morning and has progressively gotten worst/ Pt rates her pain a 9/10. Pt denies vaginal bleeding and LOF. Pt states positive fetal movement. FHT 133. Monitors applied and assessing.

## 2019-04-21 NOTE — OB Triage Note (Signed)
Discharge instructions reviewed with patient. Pt verbalized understanding and has no further questions at this time. Pt discharged in stable condition.

## 2019-04-24 ENCOUNTER — Telehealth: Payer: Self-pay | Admitting: Family Medicine

## 2019-04-24 NOTE — Telephone Encounter (Signed)
Returned pt. Call- asking about having an u/s for baby's position.  Reviewed that at 33 1/7 wks it is a little early to be concerned.  If provider is uncertain about position at ~36-37 weeks then may need a f/u u/s.  Feeling good and has Telehealth visit 04/29/19.

## 2019-04-24 NOTE — Telephone Encounter (Signed)
Patient asked if she can get an ultrasound appointment to see the baby's position.

## 2019-04-26 ENCOUNTER — Encounter: Payer: Self-pay | Admitting: Family Medicine

## 2019-04-26 DIAGNOSIS — O0990 Supervision of high risk pregnancy, unspecified, unspecified trimester: Secondary | ICD-10-CM | POA: Insufficient documentation

## 2019-04-26 DIAGNOSIS — F39 Unspecified mood [affective] disorder: Secondary | ICD-10-CM | POA: Insufficient documentation

## 2019-04-26 DIAGNOSIS — O99019 Anemia complicating pregnancy, unspecified trimester: Secondary | ICD-10-CM | POA: Insufficient documentation

## 2019-04-29 ENCOUNTER — Ambulatory Visit: Payer: Medicaid Other | Admitting: Nurse Practitioner

## 2019-04-29 ENCOUNTER — Other Ambulatory Visit: Payer: Self-pay

## 2019-04-29 DIAGNOSIS — O0990 Supervision of high risk pregnancy, unspecified, unspecified trimester: Secondary | ICD-10-CM

## 2019-04-29 NOTE — Progress Notes (Signed)
Telehealth phone call to patient. Patient identified and confirmed she is at current address and is comfortable discussing health care and appt info over phone and at current location. Patient does not have home BP cuff and scales to assess BP and weight. Went to [facility] L&D 04/21/19 for contractions. Denies s/s or current exposure to Covid-19. Taking PNV QD and continues to receive Iron infusions with next appt (per Epic 05/08/2019.)  Monitoring kick counts everyday. Scheduled for RV 05/14/19 @ 10:00, to arrive at 9:40 for check in. Patient awaiting provider phone call for completion of appt. Hal Morales, RN

## 2019-04-29 NOTE — Progress Notes (Addendum)
Nursing Staff Provider  Office Location  [facility] Dating    Language  English Anatomy US    Flu Vaccine  12/09/2018 Genetic Screen  NIPS:   AFP:   First Screen:  Quad:    TDaP vaccine   03/14/2019 Hgb A1C or  GTT Early  Third trimester   Rhogam  N/A   LAB RESULTS   Feeding Plan Formula Blood Type     Contraception DMPA Antibody    Circumcision  Rubella Immune, Immune (02/25 0000)  Pediatrician  [facility] RPR Nonreactive, Nonreactive, Nonreactive (02/25 0000)   Support Person  HBsAg Negative, Negative, Negative (02/25 0000)   Prenatal Classes  HIV Non-reactive (02/26 0000)  BTL Consent  GBS  (For PCN allergy, check sensitivities)   VBAC Consent  Pap  Due 05/2019 - age 35    Hgb Electro      CF   Delivery Group  [facility]   [facility] 67B        Swelling noted in ankles mainly - discussed drinking at least 8 glasses of water daily and decreasing sodium in diet Stopped smoking after she found out she was pregnant Discussed recent ED visit - BV - completed Metronidazole medication - denies any additional concerns at this time    Subjective:  Cindy [name] is a 23 y.o. female being seen today for continuation of prenatal care. The patient reports they do not have symptoms.  Patient has the following medical conditionshas Abdominal pain affecting pregnancy; Iron deficiency anemia; Morbid obesity with BMI of 40.0-44.9, adult ([place]); Hemoglobin C trait ([place]); Type AB blood, Rh positive; Pica; Supervision of high risk pregnancy, antepartum; Mood disorder ([place]); and Anemia affecting pregnancy, antepartum on their problem list.  Chief Complaint  Patient presents with  . Routine Prenatal Visit    Telehealth    Patient reports  HPI   See flowsheet for further details and programmatic requirements.    The following portions of the patient's history were reviewed and updated as appropriate: allergies, current medications, past family history, past medical history, past social history,  past surgical history and problem list. Problem list updated.  Objective:  There were no vitals filed for this visit.  Physical Exam    Assessment and Plan:  Cindy [name] is a 23 y.o. female presenting to the [facility] Department for continuation of prenatal care  There are no diagnoses linked to this encounter.    TELEPHONE OBSTETRICS VISIT ENCOUNTER NOTE  I connected with@ on 04/29/19 at  8:40 AM EDT by telephone at home and verified that I am speaking with the correct person using two identifiers.   I discussed the limitations, risks, security and privacy concerns of performing an evaluation and management service by telephone and the availability of in person appointments. I also discussed with the patient that there may be a patient responsible charge related to this service. The patient expressed understanding and agreed to proceed.  Subjective:  Cindy [name] is a 23 y.o. G2P1001 at [redacted]w[redacted]d being followed for ongoing prenatal care.  She is currently monitored for the following issues for this high-risk pregnancy and has Abdominal pain affecting pregnancy; Iron deficiency anemia; Morbid obesity with BMI of 40.0-44.9, adult ([place]); Hemoglobin C trait ([place]); Type AB blood, Rh positive; Pica; Supervision of high risk pregnancy, antepartum; Mood disorder ([place]); and Anemia affecting pregnancy, antepartum on their problem list.  Patient reports no complaints. Reports fetal movement. Denies any contractions, bleeding or leaking of fluid.  The following portions of the patient's history were reviewed and updated as appropriate: allergies, current medications, past family history, past medical history, past social history, past surgical history and problem list.   Objective:   General:  Alert, oriented and cooperative.   Mental Status: Normal mood and affect perceived. Normal judgment and thought content.  Rest of physical exam deferred due to type of  encounter  Assessment and Plan:  Pregnancy: G2P1001 at [redacted]w[redacted]d There are no diagnoses linked to this encounter. Preterm labor symptoms and general obstetric precautions including but not limited to vaginal bleeding, contractions, leaking of fluid and fetal movement were reviewed in detail with the patient.  I discussed the assessment and treatment plan with the patient. The patient was provided an opportunity to ask questions and all were answered. The patient agreed with the plan and demonstrated an understanding of the instructions. The patient was advised to call back or seek an in-person office evaluation/go to the hospital for any urgent or concerning symptoms.  Please refer to After Visit Summary for other counseling recommendations.   I provided 5 minutes of non-face-to-face time during this encounter.  No follow-ups on file.  Future Appointments  Date Time Provider [place]  05/08/2019  1:30 PM [facility] None  05/08/2019  2:00 PM Sindy Guadeloupe, MD [facility] None  05/14/2019 10:00 AM [facility] PROVIDER [facility] None    Berniece Andreas, NP     No follow-ups on file.  Future Appointments  Date Time Provider [place]  05/08/2019  1:30 PM [facility] None  05/08/2019  2:00 PM Sindy Guadeloupe, MD [facility] None  05/14/2019 10:00 AM [facility] PROVIDER [facility] None    Berniece Andreas, NP

## 2019-05-02 ENCOUNTER — Telehealth: Payer: Self-pay | Admitting: Family Medicine

## 2019-05-02 NOTE — Telephone Encounter (Signed)
Patient is having pelvic pain similar to contractions she said and is concerned would like a call back

## 2019-05-02 NOTE — Telephone Encounter (Signed)
Returned call to client-reports contractions this morning -not over 3x/hr., denies bleeding/spotting or ROM, reports good FM; reviewed s/s PTL and if contractions >4x/hr continue after fluids/rest, ROM, or bleeding to go to ED-agrees to plan. To continue fluids and monitoring. Questioning if will have cervix check @ next visit-informed is not a routine test but to talk to provider if concerns. Debera Lat, RN

## 2019-05-07 ENCOUNTER — Other Ambulatory Visit: Payer: Self-pay

## 2019-05-08 ENCOUNTER — Inpatient Hospital Stay: Payer: Medicaid Other | Admitting: Oncology

## 2019-05-08 ENCOUNTER — Inpatient Hospital Stay: Payer: Medicaid Other | Attending: Oncology

## 2019-05-13 ENCOUNTER — Ambulatory Visit: Payer: Self-pay

## 2019-05-14 ENCOUNTER — Other Ambulatory Visit: Payer: Self-pay

## 2019-05-14 ENCOUNTER — Ambulatory Visit: Payer: Medicaid Other | Admitting: Nurse Practitioner

## 2019-05-14 DIAGNOSIS — O99019 Anemia complicating pregnancy, unspecified trimester: Secondary | ICD-10-CM

## 2019-05-14 DIAGNOSIS — F39 Unspecified mood [affective] disorder: Secondary | ICD-10-CM

## 2019-05-14 DIAGNOSIS — O0990 Supervision of high risk pregnancy, unspecified, unspecified trimester: Secondary | ICD-10-CM

## 2019-05-14 LAB — HEMOGLOBIN, FINGERSTICK: Hemoglobin: 11.9 g/dL (ref 11.1–15.9)

## 2019-05-14 NOTE — Progress Notes (Signed)
In for visit; taking PNV; declines self collection of culture today; discussed circumcision clinic & to check with infant's Pediatrician Debera Lat, RN

## 2019-05-14 NOTE — Progress Notes (Signed)
   PRENATAL VISIT NOTE  Subjective:  Cindy [name] is a 23 y.o. G2P1001 at [redacted]w[redacted]d being seen today for ongoing prenatal care.  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  Patient reports - edema in LE's. Discussed increasing water intake to 8 glasses daily and elevating feet while resting.   Contractions: Not present. Vag. Bleeding: None.  Movement: Present. Denies leaking of fluid/ROM.   The following portions of the patient's history were reviewed and updated as appropriate: allergies, current medications, past family history, past medical history, past social history, past surgical history and problem list. Problem list updated.  Objective:   Vitals:   05/14/19 1016  BP: 108/69  Temp: 98.6 F (37 C)  Weight: 231 lb 9.6 oz (105.1 kg)    Fetal Status:   Fundal Height: 37 cm Movement: Present     General:  Alert, oriented and cooperative. Patient is in no acute distress.  Skin: Skin is warm and dry. No rash noted.   Cardiovascular: Normal heart rate noted  Respiratory: Normal respiratory effort, no problems with respiration noted  Abdomen: Soft, gravid, appropriate for gestational age.  Pain/Pressure: Absent     Pelvic: Cervical exam deferred        Extremities: Normal range of motion.  Edema: Mild pitting, slight indentation  Mental Status: Normal mood and affect. Normal behavior. Normal judgment and thought content.   Assessment and Plan:  Pregnancy: G2P1001 at [redacted]w[redacted]d  1. Supervision of high risk  pregnancy, antepartum Client doing well 36 wks labs self collected today  GC/Chlamydia collected 04/21/2019 - negative Client to self collect GBS  2. Anemia affecting pregnancy, antepartum Client admits to taking PNV and iron tabs separately Plan to continue to monitor Hgb level  Client verbalizes understanding and is in agreement with plan of care    Preterm labor symptoms and general obstetric precautions including but not limited to vaginal bleeding, contractions, leaking of fluid and fetal movement were reviewed in detail with the patient. Please refer to After Visit Summary for other counseling recommendations.  Return in about 1 week (around 05/21/2019) for telehealth.  Future Appointments  Date Time Provider Industry  05/21/2019 10:00 AM [facility] PROVIDER [facility] None    Berniece Andreas, NP

## 2019-05-16 DIAGNOSIS — O99820 Streptococcus B carrier state complicating pregnancy: Secondary | ICD-10-CM | POA: Insufficient documentation

## 2019-05-16 LAB — STREP GP B NAA: Strep Gp B NAA: POSITIVE — AB

## 2019-05-19 ENCOUNTER — Telehealth: Payer: Self-pay | Admitting: Family Medicine

## 2019-05-19 ENCOUNTER — Other Ambulatory Visit: Payer: Self-pay

## 2019-05-19 ENCOUNTER — Observation Stay
Admission: EM | Admit: 2019-05-19 | Discharge: 2019-05-19 | Disposition: A | Discharge status: Home / Self Care | Payer: Medicaid Other | Attending: Certified Nurse Midwife | Admitting: Certified Nurse Midwife

## 2019-05-19 DIAGNOSIS — O99019 Anemia complicating pregnancy, unspecified trimester: Secondary | ICD-10-CM

## 2019-05-19 DIAGNOSIS — O99213 Obesity complicating pregnancy, third trimester: Secondary | ICD-10-CM | POA: Insufficient documentation

## 2019-05-19 DIAGNOSIS — D649 Anemia, unspecified: Secondary | ICD-10-CM | POA: Insufficient documentation

## 2019-05-19 DIAGNOSIS — O321XX0 Maternal care for breech presentation, not applicable or unspecified: Secondary | ICD-10-CM

## 2019-05-19 DIAGNOSIS — Z87891 Personal history of nicotine dependence: Secondary | ICD-10-CM | POA: Insufficient documentation

## 2019-05-19 DIAGNOSIS — O99013 Anemia complicating pregnancy, third trimester: Secondary | ICD-10-CM | POA: Diagnosis not present

## 2019-05-19 DIAGNOSIS — F39 Unspecified mood [affective] disorder: Secondary | ICD-10-CM

## 2019-05-19 DIAGNOSIS — Z3A36 36 weeks gestation of pregnancy: Secondary | ICD-10-CM | POA: Diagnosis not present

## 2019-05-19 DIAGNOSIS — Z79899 Other long term (current) drug therapy: Secondary | ICD-10-CM | POA: Diagnosis not present

## 2019-05-19 DIAGNOSIS — O4703 False labor before 37 completed weeks of gestation, third trimester: Secondary | ICD-10-CM

## 2019-05-19 DIAGNOSIS — O0990 Supervision of high risk pregnancy, unspecified, unspecified trimester: Secondary | ICD-10-CM

## 2019-05-19 DIAGNOSIS — Z20828 Contact with and (suspected) exposure to other viral communicable diseases: Secondary | ICD-10-CM | POA: Insufficient documentation

## 2019-05-19 DIAGNOSIS — O99820 Streptococcus B carrier state complicating pregnancy: Secondary | ICD-10-CM

## 2019-05-19 LAB — SARS CORONAVIRUS 2 BY RT PCR (HOSPITAL ORDER, PERFORMED IN ~~LOC~~ HOSPITAL LAB): SARS Coronavirus 2: NEGATIVE

## 2019-05-19 NOTE — Final Progress Note (Signed)
Physician Final Progress Note  Patient ID: Cindy [name] MRN: [id number] DOB/AGE: 1995/11/25 23 y.o.  Admit date: 05/19/2019 Admitting provider: Homero Fellers, MD/ Jesus Genera. Danise Mina, CNM Discharge date: 05/19/2019   Admission Diagnoses: IUP at 36wk5d with contractions  Discharge Diagnoses:  Active Problems:   Preterm contractions   Breech presentation   Consults: None  Significant Findings/ Diagnostic Studies: Cindy [name] is a 23 year old G2 P49 Black female with EDC=06/11/2019 at 36wk5d by her LMP and confirmed with a 13 week ultrasound. She presented with contractions and vaginal pain with contractions. She was not in labor and cervix was FT/TH/OOP. The fetal heart tracing was reactive. AFI was 13 cm.  Baby was in breech presentation, however, and she was counseled regarding options for delivery. She desires external cephalic version in hopes of avoiding a Cesarean section,  and she will return to L&D tomorrow 05/20/2019 at 0800 for the version with Dr Kenton Kingfisher.     Procedures: none  Discharge Condition: stable  Disposition: Discharge disposition: 01-Home or Self Care       Diet: Regular diet, and NPO after midmight  Discharge Activity: Activity as tolerated  Discharge Instructions    Discharge patient   Complete by: As directed    Discharge disposition: 01-Home or Self Care   Discharge patient date: 05/19/2019     Allergies as of 05/19/2019   No Known Allergies     Medication List    TAKE these medications   PRENATAL VITAMIN PO Take 1 tablet by mouth daily.        Total time spent taking care of this patient: 20 minutes  Signed: Dalia Heading 05/19/2019, 5:12 PM

## 2019-05-19 NOTE — Telephone Encounter (Signed)
Patient has been having pressure and pain.

## 2019-05-19 NOTE — Telephone Encounter (Signed)
TC to patient who states she is having "a lot of pressure" pressing down inside, and also having pain at pain level 8. Patient states she has drunk some water and tried lying down on each side, and has no relief of symptoms. Patient states she is unsure about whether she is having contractions, but her belly is getting very tight periodically. Per consult with provider, Wilburn Mylar, patient told to go to ED to be checked for PTL. Patient agrees to plan.Jenetta Downer, RN

## 2019-05-19 NOTE — Discharge Instructions (Signed)
Return to Labor and Delivery tomorrow at 0800 for a version to try to turn your baby to head down. Do not eat or drink anything after midnight tonight. Return to Labor and Delivery for strong contractions every 3-5 minutes x 1-2 hours, or if leaking water, or if bleeding like a period or if your baby is not moving well. You may have some spotting today from your cervical exam.   Breech Birth A breech birth is when a baby is born with the buttocks or feet first. Most babies are in a head down (vertex) position when they are born. There are three types of breech babies:  When the babys buttocks are showing first in the vagina (birth canal) with the legs bent at the knees and the feet down near the buttocks (complete breech).  When the babys buttocks are showing first in the birth canal with the legs straight up and the feet at the babys head (frank breech).  When one or both of the babys feet are showing first in the birth canal along with the buttocks (footling breech). What are the health risks of having a breech birth? Having a breech birth increases the health risks to your baby. A breech birth may cause the following:  Umbilical cord prolapse. This is when the umbilical cord enters the birth canal ahead of the baby, before or during labor. This can cause the cord to become pinched or compressed as labor continues. This can reduce the flow of blood and oxygen to the baby.  The baby getting stuck in the birth canal, which can cause injury or, rarely, death.  Injury to the baby's nerves in the shoulder, arm, and hand (brachial plexus injury) when delivered. What increases the risk of having a breech baby? It is not known what causes your baby to be breech. However, you are more likely to have a breech baby if:  You have had a previous pregnancy.  You are having more than one baby.  Your baby has certain birth (congenital) defects.  You have started your labor earlier than  expected (premature labor).  You have problems with your uterus, such as a tumor or abnormally shaped uterus.  You have too much or not enough fluid surrounding the baby (amniotic fluid). How do I know if my baby is breech? There are no symptoms for you to know that your baby is breech. When you are close to your due date, your health care provider can tell if your baby is breech by doing:  An abdominal or vaginal (pelvic) exam.  An ultrasound. What can be done if my baby is breech?  Your health care provider may try to turn the baby in your uterus. He or she will use a procedure called external cephalic version (ECV). He or she will place both hands on your abdomen and gently and slowly turn the baby around. It is important to know that ECV can increase your chances of suddenly going into labor. For this reason, an ECV is only done toward the end of a healthy pregnancy. The baby may remain in this position, but sometimes he or she may turn back to the breech position. You and your health care provider will discuss if an ECV is recommended for you and your baby. How will I delivery my baby if he or she is breech? You and your health care provider will discuss the best way to deliver your baby. If your baby is breech, it is  less likely that a vaginal delivery will be recommended due to the risks to you and your baby. Your health care provider may recommend that you deliver your baby through a Cesarean section (C-section). A C-section is the surgical delivery of a baby through an incision in the abdomen and the uterus. Summary  A breech birth is when a baby is born with the buttocks or feet first.  Having a breech birth may increase the risks to your baby.  Your health care provider may try to turn your baby in your uterus using a procedure called an external cephalic version (ECV).  If your baby cannot be turned to a head down position or if your baby remains in a breech position, your  health care provider will make recommendations about the safest way to deliver your baby. This information is not intended to replace advice given to you by your health care provider. Make sure you discuss any questions you have with your health care provider. Document Released: 11/23/2006 Document Revised: 09/14/2017 Document Reviewed: 06/28/2017 Elsevier Patient Education  2020 [employer].

## 2019-05-19 NOTE — H&P (Signed)
OB History & Physical   History of Present Illness:  Chief Complaint:  Presents with complaints of contractions and vaginal pain with contractions HPI:  Cindy [name] is a 23 y.o. G31P1001 female with EDC=06/11/2019 at [redacted]w[redacted]d dated by LMP.  Her pregnancy has been complicated by anemia requiring IV iron infusions x 5, hemoglobin C,  and obesity with current BMI 45.23 kg/m2.  She presented to L&D originally for evaluation of possible labor. Her contractions were inconsistent in strength, duration, and frequency and her cervix was FT dilated and baby was out of the pelvis. There was no bleeding or leakage of fluid.  An ultrasound confirmed breech presentation.  Prenatal care site: Prenatal care at [facility] has also  been remarkable for   Nursing Staff Provider  Office Location  ACHD Dating  LMP  Language  English Anatomy US  WNL  Flu Vaccine   12/09/2018 Genetic Screen   Quad: Negative   TDaP vaccine   03/14/2019 Hgb A1C or  GTT Third trimester =93  Rhogam     LAB RESULTS   Feeding Plan  Formula Blood Type   AB pos  Contraception  Depo Antibody  negative  Circumcision  Rubella Immune, Immune (02/25 0000)  Pediatrician   [facility] RPR Nonreactive, Nonreactive, Nonreactive (02/25 0000)   Support Person  HBsAg Negative, Negative, Negative (02/25 0000)   Prenatal Classes  HIV Non-reactive (02/26 0000)  BTL Consent  GBS positive  VBAC Consent  Pap  @21     Hgb Electro   Hemoglobin C Trait    CF   Delivery Group  [facility] SMA   Centering Group na         Maternal Medical History:   Past Medical History:  Diagnosis Date  . Anemia   . Depression    is past hx but not active now per pt  . Morbidly obese ([place])    notes from health dept as hx    Past Surgical History:  Procedure Laterality Date  . COLONOSCOPY WITH PROPOFOL N/A 06/15/2017   Procedure: COLONOSCOPY WITH PROPOFOL;  Surgeon: [name], MD;  Location: [facility] ENDOSCOPY;  Service: Gastroenterology;  Laterality: N/A;  .  ESOPHAGOGASTRODUODENOSCOPY (EGD) WITH PROPOFOL N/A 06/15/2017   Procedure: ESOPHAGOGASTRODUODENOSCOPY (EGD) WITH PROPOFOL;  Surgeon: [name], MD;  Location: [facility] ENDOSCOPY;  Service: Gastroenterology;  Laterality: N/A;  . TONSILLECTOMY      No Known Allergies  Prior to Admission medications   Medication Sig Start Date End Date Taking? Authorizing Provider  Prenatal Vit-Fe Fumarate-FA (PRENATAL VITAMIN PO) Take 1 tablet by mouth daily.    [provider]      Social History: She  reports that she quit smoking about 8 months ago. She has never used smokeless tobacco. She reports previous drug use. Drug: Marijuana. She reports that she does not drink alcohol.  Family History: family history includes Hypertension in her maternal grandmother and mother; Sickle cell anemia in her sister; Sickle cell trait in her mother; Thyroid disease in her mother.   Review of Systems: Negative x 10 systems reviewed except as noted in the HPI.      Physical Exam:  Vital Signs: BP 126/65 (BP Location: Left Arm)   Pulse 100   Temp 98.2 F (36.8 C) (Oral)   Resp 16   LMP 09/04/2018  General: no acute distress.  HEENT: normocephalic, atraumatic Heart: regular rate & rhythm.  No murmurs/rubs/gallops Lungs: clear to auscultation bilaterally Abdomen: soft, gravid, non-tender;   Pelvic:   External:  Normal external female genitalia  Cervix: FT/Thick/50%/OOP   Extremities: non-tender, symmetric, no edema bilaterally.  DTRs: +1  Neurologic: Alert & oriented x 3.   Baseline FHR: 140 baseline with accelerations to 150s to 160s, moderate variability Toco: irregular contractions, mild, inconsistent duration and frequency, some irritability    Bedside Ultrasound:  Complete breech with fetal head on maternal left  AFI: 13 cm  Anterior-posterior placenta/ not fundal  Assessment:  Cindy [name] is a 23 y.o. G42P1001 female at [redacted]w[redacted]d  Not in labor Breech presentation  Plan:  Discussed  breech presentation and increased fetal risk with vaginal breech delivery. Options for Cesarean section or external cephalic version discussed. She would like to try ECV. Explained risks of bleeding, placental abruption, fetal heart rate decelerations, emergency Cesarean section with patient. Scheduled for ECV tomorrow per patient request at 0900. Discussed procedure.  NPO after midnight tonight Covid 19 testing today RTN to L&D at 0800 tomorrow.  Dalia Heading  05/19/2019 5:12 PM

## 2019-05-20 ENCOUNTER — Encounter: Payer: Self-pay | Admitting: *Deleted

## 2019-05-20 ENCOUNTER — Observation Stay
Admission: EM | Admit: 2019-05-20 | Discharge: 2019-05-20 | Disposition: A | Discharge status: Home / Self Care | Payer: Medicaid Other | Attending: Obstetrics & Gynecology | Admitting: Obstetrics & Gynecology

## 2019-05-20 DIAGNOSIS — Z3A36 36 weeks gestation of pregnancy: Secondary | ICD-10-CM | POA: Insufficient documentation

## 2019-05-20 DIAGNOSIS — O99820 Streptococcus B carrier state complicating pregnancy: Secondary | ICD-10-CM | POA: Diagnosis not present

## 2019-05-20 DIAGNOSIS — O321XX0 Maternal care for breech presentation, not applicable or unspecified: Principal | ICD-10-CM | POA: Diagnosis present

## 2019-05-20 LAB — SAMPLE TO BLOOD BANK

## 2019-05-20 MED ORDER — MINERAL OIL LIGHT OIL
TOPICAL_OIL | Freq: Once | Status: DC
  Filled 2019-05-20: qty 10

## 2019-05-20 MED ORDER — TERBUTALINE SULFATE 1 MG/ML IJ SOLN
0.2500 mg | Freq: Once | INTRAMUSCULAR | Status: AC
  Administered 2019-05-20: 09:00:00 0.25 mg via SUBCUTANEOUS
  Filled 2019-05-20: qty 1

## 2019-05-20 MED ORDER — LIDOCAINE HCL (PF) 1 % IJ SOLN: 30.0000 mL | INTRAMUSCULAR | Status: DC | PRN

## 2019-05-20 MED ORDER — SODIUM CHLORIDE 0.9% FLUSH: 3.0000 mL | Freq: Two times a day (BID) | INTRAVENOUS | Status: DC

## 2019-05-20 MED ORDER — BUTORPHANOL TARTRATE 2 MG/ML IJ SOLN
1.0000 mg | INTRAMUSCULAR | Status: DC | PRN
  Administered 2019-05-20: 1 mg via INTRAVENOUS
  Filled 2019-05-20: qty 1

## 2019-05-20 MED ORDER — SODIUM CHLORIDE 0.9 % IV SOLN: 250.0000 mL | INTRAVENOUS | Status: DC | PRN

## 2019-05-20 MED ORDER — LACTATED RINGERS IV SOLN
INTRAVENOUS | Status: DC
  Administered 2019-05-20: 1000 mL via INTRAVENOUS

## 2019-05-20 MED ORDER — SODIUM CHLORIDE 0.9% FLUSH: 3.0000 mL | INTRAVENOUS | Status: DC | PRN

## 2019-05-20 NOTE — OB Triage Note (Signed)
Pt here for an External cephalic version.

## 2019-05-20 NOTE — Procedures (Signed)
ECV  After informed consent, including reviewing the potential risks of pain, failure to turn fetus,  rupture of membranes, uterine abruption, bleeding and fetal death.  Patient opted to proceed with the procedure.  Ultrasound at bedside confirms breech presentation. Terbutaline 0.25 mg SQ was given  ECV was attempted under Ultrasound guidance.   The version was successful on the second attempt with a backwards roll.  FHR was 140 and reactive before and after the procedure.  Toco showed minimal uterine irritability. Pt. Tolerated the procedure well.  There were no complications and the pt will be discharged to home with abdominal binder after 1 -2 hours of fetal and uterine monitoring.  Barnett Applebaum, MD, [facility], [facility] 05/20/2019  9:46 AM

## 2019-05-20 NOTE — Progress Notes (Signed)
S: Patient has complaint of constant pressure in her lower abdomen.   O: Resp 20   Ht 5' (1.524 m)   Wt 101.2 kg   LMP 09/04/2018   BMI 43.57 kg/m  Cervix: 0.5 to 1/thick/high/posterior Toco: every 2-4 minutes lasting 30 to 60 seconds Fetal well being: 135 bpm, moderate variability, +accelerations, -decelerations  A: 23 yo G2 P1 at 36 weeks 6 days s/p successful external cephalic version with pelvic pain and pressure, contractions  P: Continue to observe for increase in frequency/strength of contractions/cervical change with admission as needed or discharge to home for no cervical change.  Christean Leaf, CNM [facility] 05/20/2019, 12:16 PM

## 2019-05-20 NOTE — Progress Notes (Signed)
  Labor Progress Note   23 y.o. G2P1001 @ [redacted]w[redacted]d , admitted for ECV  Subjective:  Still having lower abd pains  Objective:  BP (!) 112/57   Pulse 96   Temp 98 F (36.7 C) (Oral)   Resp 18   Ht 5' (1.524 m)   Wt 101.2 kg   LMP 09/04/2018   BMI 43.57 kg/m  Abd: gravid, ND, FHT present, moderate tenderness on exam Extr: trace to 1+ bilateral pedal edema SVE: CERVIX: 2 cm dilated, 60 effaced, BALLOTABLE station, presenting part VTX MEMBRANES: intact  EFM: FHR: 140 bpm, variability: moderate,  accelerations:  Present,  decelerations:  Absent Toco: irreg ctxs Labs: I have reviewed the patient's lab results.   Assessment & Plan:  G2P1001 @ [redacted]w[redacted]d, admitted for  Pregnancy and Labor/Delivery Management  1. Pain management: none. 2. FWB: FHT category 1.  3. ID: GBS positive 4. Labor management: Monitoring for s/sx labor after successful ECV. Fluids, position changes, rest, analgesia. If any further cervical change, then would be considered in labor.  ABX and further mgt of labor then.  She still may quiet down and this be just post procedure ctxs and pains.  As she is still early (36 6/7) do not want to encourage labor.  All discussed with patient, see orders  Barnett Applebaum, MD, [facility], [facility] 05/20/2019  1:54 PM

## 2019-05-20 NOTE — Discharge Instructions (Signed)
Braxton Hicks Contractions °Contractions of the uterus can occur throughout pregnancy, but they are not always a sign that you are in labor. You may have practice contractions called Braxton Hicks contractions. These false labor contractions are sometimes confused with true labor. °What are Braxton Hicks contractions? °Braxton Hicks contractions are tightening movements that occur in the muscles of the uterus before labor. Unlike true labor contractions, these contractions do not result in opening (dilation) and thinning of the cervix. Toward the end of pregnancy (32-34 weeks), Braxton Hicks contractions can happen more often and may become stronger. These contractions are sometimes difficult to tell apart from true labor because they can be very uncomfortable. You should not feel embarrassed if you go to the hospital with false labor. °Sometimes, the only way to tell if you are in true labor is for your health care provider to look for changes in the cervix. The health care provider will do a physical exam and may monitor your contractions. If you are not in true labor, the exam should show that your cervix is not dilating and your water has not broken. °If there are no other health problems associated with your pregnancy, it is completely safe for you to be sent home with false labor. You may continue to have Braxton Hicks contractions until you go into true labor. °How to tell the difference between true labor and false labor °True labor °· Contractions last 30-70 seconds. °· Contractions become very regular. °· Discomfort is usually felt in the top of the uterus, and it spreads to the lower abdomen and low back. °· Contractions do not go away with walking. °· Contractions usually become more intense and increase in frequency. °· The cervix dilates and gets thinner. °False labor °· Contractions are usually shorter and not as strong as true labor contractions. °· Contractions are usually irregular. °· Contractions  are often felt in the front of the lower abdomen and in the groin. °· Contractions may go away when you walk around or change positions while lying down. °· Contractions get weaker and are shorter-lasting as time goes on. °· The cervix usually does not dilate or become thin. °Follow these instructions at home: ° °· Take over-the-counter and prescription medicines only as told by your health care provider. °· Keep up with your usual exercises and follow other instructions from your health care provider. °· Eat and drink lightly if you think you are going into labor. °· If Braxton Hicks contractions are making you uncomfortable: °? Change your position from lying down or resting to walking, or change from walking to resting. °? Sit and rest in a tub of warm water. °? Drink enough fluid to keep your urine pale yellow. Dehydration may cause these contractions. °? Do slow and deep breathing several times an hour. °· Keep all follow-up prenatal visits as told by your health care provider. This is important. °Contact a health care provider if: °· You have a fever. °· You have continuous pain in your abdomen. °Get help right away if: °· Your contractions become stronger, more regular, and closer together. °· You have fluid leaking or gushing from your vagina. °· You pass blood-tinged mucus (bloody show). °· You have bleeding from your vagina. °· You have low back pain that you never had before. °· You feel your baby’s head pushing down and causing pelvic pressure. °· Your baby is not moving inside you as much as it used to. °Summary °· Contractions that occur before labor are   called Braxton Hicks contractions, false labor, or practice contractions.  Braxton Hicks contractions are usually shorter, weaker, farther apart, and less regular than true labor contractions. True labor contractions usually become progressively stronger and regular, and they become more frequent.  Manage discomfort from [facility] contractions  by changing position, resting in a warm bath, drinking plenty of water, or practicing deep breathing. This information is not intended to replace advice given to you by your health care provider. Make sure you discuss any questions you have with your health care provider. Document Released: 02/15/2017 Document Revised: 09/14/2017 Document Reviewed: 02/15/2017 Elsevier Patient Education  2020 [employer].

## 2019-05-20 NOTE — Discharge Summary (Signed)
Gynecology Physician Postoperative Discharge Summary  Patient ID: Cindy [name] MRN: [id number] DOB/AGE: 04-23-1996 23 y.o.  Admit Date: 05/20/2019 Discharge Date: 05/20/2019  Procedures: ECV  Significant Labs: CBC Latest Ref Rng & Units 04/15/2019 03/13/2019 01/30/2019  WBC 4.0 - 10.5 K/uL - 11.1(H) 10.8(H)  Hemoglobin 11.1 - 15.9 g/dL 10.8(L) 10.9(L) 11.2(L)  Hematocrit 36.0 - 46.0 % - 31.9(L) 32.0(L)  Platelets 150 - 400 K/uL - 274 285    Hospital Course:  Cindy [name] is a 23 y.o. G2P1001  admitted for scheduled procedure.  She underwent the procedures as mentioned above, her successful ECV was uncomplicated.  By time of discharge several hours later after careful FHT monitoring and cervical exams and repeat ultrasounds to confirm continued vertex, her pain was controlled; she was ambulating, voiding without difficulty, tolerating regular diet. She was deemed stable for discharge to home. No fetal heart rate concerns.  Her cervix is noted to be 2 cm dilated, but only 60% effaced and ballottable presentation (on numerous exams in follow up).  Discharge Exam: Blood pressure (!) 112/57, pulse 96, temperature 98 F (36.7 C), temperature source Oral, resp. rate 18, height 5' (1.524 m), weight 101.2 kg, last menstrual period 09/04/2018. General appearance: alert and no distress  Resp: clear to auscultation bilaterally  Cardio: regular rate and rhythm  GI: soft, non-tender; bowel sounds normal; no masses, no organomegaly.  Extremities: extremities normal, atraumatic, no cyanosis or edema and Homans sign is negative, no sign of DVT  Discharged Condition: Stable  Disposition: Discharge disposition: 01-Home or Self Care      Discharge Instructions    Call MD for:   Complete by: As directed    Worsening contractions or pain; leakage of fluid; bleeding.   Diet general   Complete by: As directed    Increase activity slowly   Complete by: As directed      Allergies as of 05/20/2019    No Known Allergies     Medication List    TAKE these medications   PRENATAL VITAMIN PO Take 1 tablet by mouth daily.      Follow-up Information    [facility]. Go in 1 week(s).   Contact information: [place]          Barnett Applebaum, MD

## 2019-05-20 NOTE — H&P (Signed)
History and Physical Interval Note:  05/20/2019 9:04 AM  Harrington Challenger  has presented today for External Cephalic Version,  with the diagnosis of complete breech presentation at 36 6/[redacted] weeks EGA. The various methods of treatment have been discussed with the patient and family. After consideration of risks, benefits and other options for treatment, the patient has consented to  ECV .  The patient's history has been reviewed, patient examined, no change in status, and is stable for induction as planned. Ultrasounf currently confirms complete breech, anterior placenta, AFI >10, good FM and FHTs.   See H&P. I have reviewed the patient's chart and labs.  Questions were answered to the patient's satisfaction.    A NST procedure was performed with FHR monitoring and a normal baseline established, appropriate time of 20-40 minutes of evaluation, and accels >2 seen w 15x15 characteristics.  Results show a REACTIVE NST.   Barnett Applebaum, MD, [facility], [facility] 05/20/2019  9:04 AM

## 2019-05-21 ENCOUNTER — Other Ambulatory Visit: Payer: Self-pay

## 2019-05-21 ENCOUNTER — Telehealth: Payer: Self-pay

## 2019-05-21 ENCOUNTER — Other Ambulatory Visit: Payer: Self-pay | Admitting: Advanced Practice Midwife

## 2019-05-21 ENCOUNTER — Ambulatory Visit: Payer: Medicaid Other | Admitting: Nurse Practitioner

## 2019-05-21 DIAGNOSIS — O99019 Anemia complicating pregnancy, unspecified trimester: Secondary | ICD-10-CM

## 2019-05-21 DIAGNOSIS — O0990 Supervision of high risk pregnancy, unspecified, unspecified trimester: Secondary | ICD-10-CM

## 2019-05-21 DIAGNOSIS — O26893 Other specified pregnancy related conditions, third trimester: Secondary | ICD-10-CM

## 2019-05-21 DIAGNOSIS — R102 Pelvic and perineal pain: Secondary | ICD-10-CM

## 2019-05-21 MED ORDER — CYCLOBENZAPRINE HCL 10 MG PO TABS: 10.0000 mg | ORAL_TABLET | Freq: Three times a day (TID) | ORAL | 2 refills | Status: DC | PRN

## 2019-05-21 NOTE — Progress Notes (Unsigned)
Spoke with patient. She has been having continuous lower abdominal and pelvic pain since having external version yesterday. She did not get much sleep last night. She describes the pain and pressure as constant. She denies leakage of fluid or vaginal bleeding. She admits fetal movement. She has tried some heat and cold to the area. She took tylenol last night with minimal relief. Suggested she soak in tub or put heating pad on the area. Tylenol with benadryl to help her sleep. Rx flexeril sent to her pharmacy. She declined narcotic pain medicine. Labor precautions reviewed.

## 2019-05-21 NOTE — Telephone Encounter (Signed)
Per Jerline Pain FNP-BC, client needs to contact provider that did version for pain management if Extra Strength Tylenol is ineffective. Phone call to client and counseled regarding above. Shona Needles, RN

## 2019-05-21 NOTE — Telephone Encounter (Signed)
Call from client requesting something for pain as a result of version yesterday. Client states Tylenol is not working. Shona Needles, RN

## 2019-05-21 NOTE — Progress Notes (Signed)
  TELEPHONE OBSTETRICS VISIT ENCOUNTER NOTE  I connected with Cindy [name] on 05/21/19 at 10:00 AM EDT by telephone at home and verified that I am speaking with the correct person using two identifiers.   I discussed the limitations, risks, security and privacy concerns of performing an evaluation and management service by telephone and the availability of in person appointments. I also discussed with the patient that there may be a patient responsible charge related to this service. The patient expressed understanding and agreed to proceed.  Subjective:  Cindy [name] is a 23 y.o. G2P1001 at [redacted]w[redacted]d being followed for ongoing prenatal care.  She is currently monitored for the following issues for this high-risk pregnancy and has Abdominal pain affecting pregnancy; Iron deficiency anemia; Morbid obesity with BMI of 40.0-44.9, adult ([place]); Hemoglobin C trait ([place]); Type AB blood, Rh positive; Pica; Supervision of high risk pregnancy, antepartum; Mood disorder ([place]); Anemia affecting pregnancy, antepartum; Group B Streptococcus carrier state affecting pregnancy; Indication for care in labor and delivery, antepartum; and Breech presentation on their problem list.  Patient reports no complaints. Reports fetal movement. Denies any contractions, bleeding or leaking of fluid.   The following portions of the patient's history were reviewed and updated as appropriate: allergies, current medications, past family history, past medical history, past social history, past surgical history and problem list.   Objective:   General:  Alert, oriented and cooperative.   Mental Status: Normal mood and affect perceived. Normal judgment and thought content.  Rest of physical exam deferred due to type of encounter  Assessment and Plan:  Pregnancy: G2P1001 at [redacted]w[redacted]d 1. Supervision of high risk pregnancy, antepartum Client admits to doing well  Reviewed the following questions with client: Any headaches -  no Swelling in feet or hands - no ROM - no Vaginal bleeding or discharge - no Feeling the baby move - no Any contractions - occasional - discussed L&D Taking PNV daily - yes Iron separate of PNV - stopped taking iron tabs Do you smoke currently or did you smoke prior to pregnancy - stopped after finding out about pregnancy Next visit will be: 1 wk   2. Anemia affecting pregnancy, antepartum Encourage client to ingest foods high in iron Client verbalizes understanding and is in agreement with plan of care   Term labor symptoms and general obstetric precautions including but not limited to vaginal bleeding, contractions, leaking of fluid and fetal movement were reviewed in detail with the patient.  I discussed the assessment and treatment plan with the patient. The patient was provided an opportunity to ask questions and all were answered. The patient agreed with the plan and demonstrated an understanding of the instructions. The patient was advised to call back or seek an in-person office evaluation/go to the hospital for any urgent or concerning symptoms.  Please refer to After Visit Summary for other counseling recommendations.   I provided 6 minutes of non-face-to-face time during this encounter.  Return in about 1 week (around 05/28/2019) for routine prenatal care.  Future Appointments  Date Time Provider [place]  05/28/2019 10:20 AM [facility] PROVIDER [facility] None    Berniece Andreas, NP

## 2019-05-21 NOTE — Progress Notes (Signed)
Per client, tested for Covid in advance of version appt this week. Negative results and successful version per client. Negative responses to Covid screening questions and denies international travel for self or FOB since pregnant. Taking PNV daily. Shona Needles, RN  Counseled regarding positive GBS status. Shona Needles, RN

## 2019-05-27 ENCOUNTER — Other Ambulatory Visit: Payer: Self-pay

## 2019-05-27 ENCOUNTER — Observation Stay
Admission: EM | Admit: 2019-05-27 | Discharge: 2019-05-27 | Disposition: A | Discharge status: Home / Self Care | Payer: Medicaid Other | Attending: Obstetrics & Gynecology | Admitting: Obstetrics & Gynecology

## 2019-05-27 DIAGNOSIS — O2343 Unspecified infection of urinary tract in pregnancy, third trimester: Secondary | ICD-10-CM

## 2019-05-27 DIAGNOSIS — O26893 Other specified pregnancy related conditions, third trimester: Secondary | ICD-10-CM

## 2019-05-27 DIAGNOSIS — O99019 Anemia complicating pregnancy, unspecified trimester: Secondary | ICD-10-CM

## 2019-05-27 DIAGNOSIS — O99343 Other mental disorders complicating pregnancy, third trimester: Secondary | ICD-10-CM | POA: Diagnosis not present

## 2019-05-27 DIAGNOSIS — D509 Iron deficiency anemia, unspecified: Secondary | ICD-10-CM | POA: Diagnosis not present

## 2019-05-27 DIAGNOSIS — O99213 Obesity complicating pregnancy, third trimester: Secondary | ICD-10-CM | POA: Diagnosis not present

## 2019-05-27 DIAGNOSIS — Z3A37 37 weeks gestation of pregnancy: Secondary | ICD-10-CM | POA: Insufficient documentation

## 2019-05-27 DIAGNOSIS — F39 Unspecified mood [affective] disorder: Secondary | ICD-10-CM

## 2019-05-27 DIAGNOSIS — O99820 Streptococcus B carrier state complicating pregnancy: Secondary | ICD-10-CM

## 2019-05-27 DIAGNOSIS — F329 Major depressive disorder, single episode, unspecified: Secondary | ICD-10-CM | POA: Insufficient documentation

## 2019-05-27 DIAGNOSIS — O99013 Anemia complicating pregnancy, third trimester: Secondary | ICD-10-CM | POA: Insufficient documentation

## 2019-05-27 DIAGNOSIS — R102 Pelvic and perineal pain: Secondary | ICD-10-CM

## 2019-05-27 DIAGNOSIS — O0990 Supervision of high risk pregnancy, unspecified, unspecified trimester: Secondary | ICD-10-CM

## 2019-05-27 LAB — URINALYSIS, ROUTINE W REFLEX MICROSCOPIC
Bacteria, UA: NONE SEEN
Bilirubin Urine: NEGATIVE
Glucose, UA: NEGATIVE mg/dL
Hgb urine dipstick: NEGATIVE
Ketones, ur: 20 mg/dL — AB
Nitrite: NEGATIVE
Protein, ur: NEGATIVE mg/dL
Specific Gravity, Urine: 1.01 (ref 1.005–1.030)
pH: 7 (ref 5.0–8.0)

## 2019-05-27 MED ORDER — NITROFURANTOIN MONOHYD MACRO 100 MG PO CAPS: 100.0000 mg | ORAL_CAPSULE | Freq: Two times a day (BID) | ORAL | 0 refills | Status: DC

## 2019-05-27 MED ORDER — NITROFURANTOIN MONOHYD MACRO 100 MG PO CAPS
100.0000 mg | ORAL_CAPSULE | Freq: Two times a day (BID) | ORAL | Status: DC
  Administered 2019-05-27: 21:00:00 100 mg via ORAL
  Filled 2019-05-27: qty 1

## 2019-05-27 MED ORDER — ONDANSETRON HCL 4 MG/2ML IJ SOLN: 4.0000 mg | Freq: Four times a day (QID) | INTRAMUSCULAR | Status: DC | PRN

## 2019-05-27 MED ORDER — LIDOCAINE HCL (PF) 1 % IJ SOLN: 30.0000 mL | INTRAMUSCULAR | Status: DC | PRN

## 2019-05-27 MED ORDER — ACETAMINOPHEN 325 MG PO TABS: 650.0000 mg | ORAL_TABLET | ORAL | Status: DC | PRN

## 2019-05-27 NOTE — OB Triage Note (Signed)
Discharge instructions provided and reviewed.  Prescription for Macrobid explained.  Follow up care discussed.  Pt verbalizes understanding.

## 2019-05-27 NOTE — Final Progress Note (Signed)
Physician Final Progress Note  Patient ID: Cindy [name] MRN: [id number] DOB/AGE: 1996-08-13 23 y.o.  Admit date: 05/27/2019 Admitting provider: Gae Dry, MD Discharge date: 05/27/2019  Admission Diagnoses: Pelvic pain, pregnancy UTI 37 weeks  Discharge Diagnoses:  Active Problems:   Pelvic pain   UTI  Consults: None  Significant Findings/ Diagnostic Studies:  Obstetrics Admission History & Physical   Contractions   HPI:  23 y.o. G2P1001 @ [redacted]w[redacted]d (06/11/2019, Date entered prior to episode creation). Admitted on 05/27/2019:   Patient Active Problem List   Diagnosis Date Noted  . Pelvic pain 05/27/2019  . Indication for care in labor and delivery, antepartum 05/19/2019  . Breech presentation 05/19/2019  . Group B Streptococcus carrier state affecting pregnancy 05/16/2019  . Supervision of high risk pregnancy, antepartum 04/26/2019  . Mood disorder ([place]) 04/26/2019  . Anemia affecting pregnancy, antepartum 04/26/2019  . Morbid obesity with BMI of 40.0-44.9, adult ([place]) 04/08/2019  . Hemoglobin C trait ([place]) 04/08/2019  . Type AB blood, Rh positive 04/08/2019  . Pica 04/08/2019  . Iron deficiency anemia 03/14/2019  . Abdominal pain affecting pregnancy 02/27/2019     Presents for lower pelvic pains, not so much like ctxs but in suprapubic region without radiation, no assoc sx's, no VB or ROM, no nausea, no modifiers or context other than 37 weeks pregnancy.   Prenatal care at: at [facility]. Pregnancy complicated by breech w successful ECV last week.  ROS: A review of systems was performed and negative, except as stated in the above HPI. PMHx:  Past Medical History:  Diagnosis Date  . Anemia   . Depression    is past hx but not active now per pt  . Morbidly obese ([place])    notes from health dept as hx   PSHx:  Past Surgical History:  Procedure Laterality Date  . COLONOSCOPY WITH PROPOFOL N/A 06/15/2017   Procedure: COLONOSCOPY WITH PROPOFOL;  Surgeon: [name], MD;  Location[facility] ENDOSCOPY;  Service: Gastroenterology;  Laterality: N/A;  . ESOPHAGOGASTRODUODENOSCOPY (EGD) WITH PROPOFOL N/A 06/15/2017   Procedure: ESOPHAGOGASTRODUODENOSCOPY (EGD) WITH PROPOFOL;  Surgeon: [name], MD;  Location: [facility] ENDOSCOPY;  Service: Gastroenterology;  Laterality: N/A;  . TONSILLECTOMY     Medications:  No medications prior to admission.   Allergies: has No Known Allergies. OBHx:  OB History  Gravida Para Term Preterm AB Living  2 1 1     1   SAB TAB Ectopic Multiple Live Births        0 1    # Outcome Date GA Lbr Len/2nd Weight Sex Delivery Anes PTL Lv  2 Current           1 Term 10/24/16 [redacted]w[redacted]d 07:53 / 00:07 3230 g F Vag-Spont EPI  LIV   BWG:YKZLDJTT/SVXBLTJQZESP except as detailed in HPI.Cindy [name]  No family history of birth defects. Soc Hx: Never smoker, Alcohol: none and Recreational drug use: none  Objective:   Vitals:   05/27/19 1753 05/27/19 1927  BP: 130/67 127/69  Pulse: 91 94  Resp: 18 18  Temp: 97.9 F (36.6 C) 98.1 F (36.7 C)  SpO2:     Constitutional: Well nourished, well developed female in no acute distress.  HEENT: normal Skin: Warm and [name].  Cardiovascular:Regular rate and rhythm.   Extremity: trace to 1+ bilateral pedal edema Respiratory: Clear to auscultation bilateral. Normal respiratory effort Abdomen: gravid, ND, FHT present, mild tenderness on exam Back: no CVAT Neuro: DTRs 2+, Cranial nerves grossly intact Psych: Alert  and Oriented x3. No memory deficits. Normal mood and affect.  MS: normal gait, normal bilateral lower extremity ROM/strength/stability.  Pelvic exam: is not limited by body habitus EGBUS: within normal limits Vagina: within normal limits and with normal mucosa Cervix: CERVIX: 0 cm dilated, 0 effaced, -4 station, presenting part Vtx MEMBRANES: intact Uterus: No contractions observed for 30 minutes.  Adnexa: not evaluated  EFM:FHR: 140 bpm, variability: moderate,  accelerations:  Present,   decelerations:  Absent Toco: None  Results for orders placed or performed during the hospital encounter of 05/27/19  Urinalysis, Routine w reflex microscopic  Result Value Ref Range   Color, Urine YELLOW (A) YELLOW   APPearance HAZY (A) CLEAR   Specific Gravity, Urine 1.010 1.005 - 1.030   pH 7.0 5.0 - 8.0   Glucose, UA NEGATIVE NEGATIVE mg/dL   Hgb urine dipstick NEGATIVE NEGATIVE   Bilirubin Urine NEGATIVE NEGATIVE   Ketones, ur 20 (A) NEGATIVE mg/dL   Protein, ur NEGATIVE NEGATIVE mg/dL   Nitrite NEGATIVE NEGATIVE   Leukocytes,Ua LARGE (A) NEGATIVE   RBC / HPF 0-5 0 - 5 RBC/hpf   WBC, UA 6-10 0 - 5 WBC/hpf   Bacteria, UA NONE SEEN NONE SEEN   Squamous Epithelial / LPF 0-5 0 - 5   Mucus PRESENT      Assessment & Plan:   23 y.o. G2P1001 @ [redacted]w[redacted]d, Admitted on 05/27/2019: UTI Tx, Macrobid Monitor for s/sx labor Keep appt's    Procedures: A NST procedure was performed with FHR monitoring and a normal baseline established, appropriate time of 20-40 minutes of evaluation, and accels >2 seen w 15x15 characteristics.  Results show a REACTIVE NST.   [place]: Vtx, +FM, +FHT  Discharge Condition: good  Disposition: Discharge disposition: 01-Home or Self Care       Diet: Regular diet  Discharge Activity: Activity as tolerated  Discharge Instructions    Call MD for:   Complete by: As directed    Worsening contractions or pain; leakage of fluid; bleeding.   Diet general   Complete by: As directed    Increase activity slowly   Complete by: As directed      Allergies as of 05/27/2019   No Known Allergies     Medication List    TAKE these medications   cyclobenzaprine 10 MG tablet Commonly known as: FLEXERIL Take 1 tablet (10 mg total) by mouth 3 (three) times daily as needed for muscle spasms.   nitrofurantoin (macrocrystal-monohydrate) 100 MG capsule Commonly known as: MACROBID Take 1 capsule (100 mg total) by mouth every 12 (twelve) hours. Start taking on: May 28, 2019   PRENATAL VITAMIN PO Take 1 tablet by mouth daily.      Follow-up Information    [facility] Go to.   Why: Keep regularly scheduled appointments. Contact information: [address] [place] [address] [phone]          Total time spent taking care of this patient: 15 minutes  Signed: Hoyt Koch 05/27/2019, 11:11 PM

## 2019-05-27 NOTE — Discharge Summary (Signed)
See FPN

## 2019-05-28 ENCOUNTER — Ambulatory Visit: Payer: Medicaid Other | Admitting: Nurse Practitioner

## 2019-05-28 ENCOUNTER — Other Ambulatory Visit: Payer: Self-pay

## 2019-05-28 DIAGNOSIS — O99820 Streptococcus B carrier state complicating pregnancy: Secondary | ICD-10-CM

## 2019-05-28 DIAGNOSIS — O99019 Anemia complicating pregnancy, unspecified trimester: Secondary | ICD-10-CM

## 2019-05-28 DIAGNOSIS — O0990 Supervision of high risk pregnancy, unspecified, unspecified trimester: Secondary | ICD-10-CM

## 2019-05-28 NOTE — Progress Notes (Signed)
Patient here for maternity revisit at 36 0/7. Patient went to [facility] ED last night, states she was diagnosed with UTI, and plans to pick up medication today. Taking PNV, GBS teaching reviewed, and printed information given.Jenetta Downer, RN

## 2019-05-28 NOTE — Progress Notes (Signed)
    PRENATAL VISIT NOTE  Subjective:  Cindy [name] is a 23 y.o. G2P1001 at [redacted]w[redacted]d being seen today for ongoing prenatal care.  She is currently monitored for the following issues for this high-risk pregnancy and has Abdominal pain affecting pregnancy; Iron deficiency anemia; Morbid obesity with BMI of 40.0-44.9, adult ([place]); Hemoglobin C trait ([place]); Type AB blood, Rh positive; Pica; Supervision of high risk pregnancy, antepartum; Mood disorder ([place]); Anemia affecting pregnancy, antepartum; Group B Streptococcus carrier state affecting pregnancy; Indication for care in labor and delivery, antepartum; Breech presentation; and Pelvic pain on their problem list.  Patient reports no complaints.  Contractions: Not present. Vag. Bleeding: None.  Movement: Present. Denies leaking of fluid/ROM.   The following portions of the patient's history were reviewed and updated as appropriate: allergies, current medications, past family history, past medical history, past social history, past surgical history and problem list. Problem list updated.  Objective:   Vitals:   05/28/19 1035  BP: (!) 110/56  Temp: 98.1 F (36.7 C)  Weight: 235 lb (106.6 kg)    Fetal Status: Fetal Heart Rate (bpm): 156 Fundal Height: 39 cm Movement: Present  Presentation: Vertex  General:  Alert, oriented and cooperative. Patient is in no acute distress.  Skin: Skin is warm and dry. No rash noted.   Cardiovascular: Normal heart rate noted  Respiratory: Normal respiratory effort, no problems with respiration noted  Abdomen: Soft, gravid, appropriate for gestational age.  Pain/Pressure: Absent     Pelvic: Cervical exam deferred        Extremities: Normal range of motion.     Mental Status: Normal mood and affect. Normal behavior. Normal judgment and thought content.   Assessment and Plan:  Pregnancy: G2P1001 at [redacted]w[redacted]d  1. Group B Streptococcus carrier state affecting pregnancy + GBS - discussed  2. Supervision of high  risk pregnancy, antepartum Client has UTI - plans to pick up rx after leaving visit today  External cephalic version completed - discussed with client  Advised client to drink 8 glasses of water daily and elevate LE's while resting   3. Anemia affecting pregnancy, antepartum Not taking iron tabs   Term labor symptoms and general obstetric precautions including but not limited to vaginal bleeding, contractions, leaking of fluid and fetal movement were reviewed in detail with the patient. Please refer to After Visit Summary for other counseling recommendations.  Return in about 1 week (around 06/04/2019) for routine prenatal care.  Future Appointments  Date Time Provider [place]  06/04/2019 10:40 AM [facility] PROVIDER [facility] None    Berniece Andreas, NP

## 2019-05-29 ENCOUNTER — Observation Stay
Admission: EM | Admit: 2019-05-29 | Discharge: 2019-05-29 | Disposition: A | Discharge status: Home / Self Care | Payer: Medicaid Other | Attending: Obstetrics and Gynecology | Admitting: Obstetrics and Gynecology

## 2019-05-29 ENCOUNTER — Telehealth: Payer: Self-pay | Admitting: Family Medicine

## 2019-05-29 ENCOUNTER — Other Ambulatory Visit: Payer: Self-pay | Admitting: Obstetrics and Gynecology

## 2019-05-29 DIAGNOSIS — Z0371 Encounter for suspected problem with amniotic cavity and membrane ruled out: Principal | ICD-10-CM | POA: Insufficient documentation

## 2019-05-29 DIAGNOSIS — O99213 Obesity complicating pregnancy, third trimester: Secondary | ICD-10-CM | POA: Insufficient documentation

## 2019-05-29 DIAGNOSIS — O99824 Streptococcus B carrier state complicating childbirth: Secondary | ICD-10-CM

## 2019-05-29 DIAGNOSIS — O471 False labor at or after 37 completed weeks of gestation: Secondary | ICD-10-CM | POA: Diagnosis not present

## 2019-05-29 DIAGNOSIS — Z3A38 38 weeks gestation of pregnancy: Secondary | ICD-10-CM | POA: Diagnosis not present

## 2019-05-29 LAB — RUPTURE OF MEMBRANE (ROM)PLUS: Rom Plus: NEGATIVE

## 2019-05-29 NOTE — Discharge Summary (Signed)
Physician Final Progress Note  Patient ID: Cindy [name] MRN: [id number] DOB/AGE: May 04, 1996 23 y.o.  Admit date: 05/29/2019 Admitting provider: Malachy Mood, MD Discharge date: 05/29/2019   Admission Diagnoses: Labor and delivery indication for care  Discharge Diagnoses:  Active Problems:   Labor and delivery indication for care or intervention  23 year old G2P1001 at [redacted]w[redacted]d by Estimated Date of Delivery: 06/11/19 presenting with concerns of rupture of membranes.  No evidence of ROM noted on exam. ROM plus obtained and negative.  +FM, irregular contractions, no VB.  Pregnancy notable for HgbC trait and obesity.  Underwent successful version in the third trimester.  We discussed that we are unable to induce her labor without medical condition prior to 39 weeks.  We also discussed findings of the ARRIVE study.  The ARRIVE study was a national multicenter trial that randomized 6,106 patients to induction of labor at 39 weeks 0 days to 39 weeks 4 days (3,062) compared to expectant management (3,044).  There was no significant difference in adverse perinatal outcomes but there was a significantly lower rate of cesarean delivery, as well as lower rate of maternal hypertensive complications in the induction group.  Number to treat was calculated as 28 induction of labor to prevent 1 primary Cesarean section.  "Labor Induction versus Expectant Management in Nulliparous Low-Risk Women" [employer] [place] [employer] iAugust 9, 2018 Vol. 379 No. 6  The patient was put on for elective IOL at 39 weeks on Wednesday 12/04/2018, with covid testing to be done at drive through testing site 06/02/2019   G2 Problems (from 02/27/19 to present)    Problem Noted Resolved   Group B Streptococcus carrier state affecting pregnancy 05/16/2019 by Debera Lat, RN No   Overview Signed 05/16/2019  9:38 AM by Debera Lat, RN    Alert L & D; counsel @ next visit       Supervision of high risk  pregnancy, antepartum 04/26/2019 by Caren Macadam, MD No   Overview Addendum 05/21/2019 10:35 AM by Shona Needles, RN     Nursing Staff Provider  Office Location  [facility] Dating  LMP  Language  English Anatomy US  WNL  Flu Vaccine   12/09/2018 Genetic Screen   Quad: Negative   TDaP vaccine   03/14/2019 Hgb A1C or  GTT Third trimester =93  Rhogam     LAB RESULTS   Feeding Plan  Formula Blood Type   AB pos  Contraception  Depo Antibody  negative  Circumcision  Rubella Immune, Immune (02/25 0000)  Pediatrician   [facility] RPR Nonreactive, Nonreactive, Nonreactive (02/25 0000)   Support Person  HBsAg Negative, Negative, Negative (02/25 0000)   Prenatal Classes  HIV Non-reactive (02/26 0000)  BTL Consent  GBS   positive    (05/14/2019)   VBAC Consent  Pap  @21     Hgb Electro   Hemoglobin C Trait    CF   Delivery Group  [facility] SMA   Centering Group na           Mood disorder ([place]) 04/26/2019 by Caren Macadam, MD No   Overview Signed 04/26/2019  9:20 PM by Caren Macadam, MD     Depressed, declined LCSW  [ ]  2 wk mood check       Anemia affecting pregnancy, antepartum 04/26/2019 by Caren Macadam, MD No   Overview Signed 04/26/2019  9:34 PM by Caren Macadam, MD    Getting Feraheme infusions  Temp:  [98.5 F (36.9 C)] 98.5 F (36.9 C) (08/13 1926) Pulse Rate:  [94-103] 94 (08/13 1926) Resp:  [18] 18 (08/13 1926) BP: (107-126)/(50-62) 126/62 (08/13 1926)   Consults: None  Significant Findings/ Diagnostic Studies: none  Procedures: * Baseline: 135 Variability: moderate Accelerations: present Decelerations: present Tocometry: irregular The patient was monitored for 30 minutes, fetal heart rate tracing was deemed reactive, category I tracing,  CPT [id number]   Discharge Condition: good  Disposition:  Discharge disposition: 01-Home or Self Care       Diet: Regular diet  Discharge Activity: Activity as tolerated  Discharge  Instructions    Discharge activity:  No Restrictions   Complete by: As directed    Discharge diet:  No restrictions   Complete by: As directed    Fetal Kick Count:  Lie on our left side for one hour after a meal, and count the number of times your baby kicks.  If it is less than 5 times, get up, move around and drink some juice.  Repeat the test 30 minutes later.  If it is still less than 5 kicks in an hour, notify your doctor.   Complete by: As directed    LABOR:  When conractions begin, you should start to time them from the beginning of one contraction to the beginning  of the next.  When contractions are 5 - 10 minutes apart or less and have been regular for at least an hour, you should call your health care provider.   Complete by: As directed    No sexual activity restrictions   Complete by: As directed    Notify physician for bleeding from the vagina   Complete by: As directed    Notify physician for blurring of vision or spots before the eyes   Complete by: As directed    Notify physician for chills or fever   Complete by: As directed    Notify physician for fainting spells, "black outs" or loss of consciousness   Complete by: As directed    Notify physician for increase in vaginal discharge   Complete by: As directed    Notify physician for leaking of fluid   Complete by: As directed    Notify physician for pain or burning when urinating   Complete by: As directed    Notify physician for pelvic pressure (sudden increase)   Complete by: As directed    Notify physician for severe or continued nausea or vomiting   Complete by: As directed    Notify physician for sudden gushing of fluid from the vagina (with or without continued leaking)   Complete by: As directed    Notify physician for sudden, constant, or occasional abdominal pain   Complete by: As directed    Notify physician if baby moving less than usual   Complete by: As directed      Allergies as of 05/29/2019   No  Known Allergies     Medication List    TAKE these medications   cyclobenzaprine 10 MG tablet Commonly known as: FLEXERIL Take 1 tablet (10 mg total) by mouth 3 (three) times daily as needed for muscle spasms.   nitrofurantoin (macrocrystal-monohydrate) 100 MG capsule Commonly known as: MACROBID Take 1 capsule (100 mg total) by mouth every 12 (twelve) hours.   PRENATAL VITAMIN PO Take 1 tablet by mouth daily.        Total time spent taking care of this patient: 30 minutes  Signed: Malachy Mood 05/29/2019,  8:44 PM

## 2019-05-29 NOTE — Progress Notes (Signed)
  [facility] REGIONAL BIRTHPLACE INDUCTION ASSESSMENT SCHEDULING Cindy [name] July 30, 1996 Medical record #: [id number] Phone #:  Home Phone [phone]  Mobile [phone]    Prenatal Provider:[facility] Delivering Group:[facility] Proposed admission date/time:06/04/2019 0500 Method of induction:Cytotec  Weight: There were no vitals filed for this visit. BMI There is no height or weight on file to calculate BMI. HIV Negative HSV Negative EDC Estimated Date of Delivery: 8/26/20based on:US  Gestational age on admission: [redacted]w[redacted]d Gravidity/parity:G2P1001  Cervix Score   0 1 2 3   Position Posterior Midposition Anterior   Consistency Firm Medium Soft   Effacement (%) 0-30 40-50 60-70 >80  Dilation (cm) Closed 1-2 3-4 >5  Baby's station -3 -2 -1 +1, +2   Bishop Score:4   Medical induction of labor  select indication(s) below Elective induction ?39 weeks multiparous patient ?39 weeks primiparous patient with Bishop score ?7 ?40 weeks primiparous patient   Medical Indications Adapted from [place] #[id number], "Medically Indicated Late Preterm and Early Term Deliveries," 2013.  PLACENTAL / UTERINE ISSUES FETAL ISSUES MATERNAL ISSUES  ? Placenta previa (36.0-37.6) ? Isoimmunization (37.0-38.6) ? Preeclampsia without severe features or gestational HTN (37.0)  ? Suspected accreta (34.0-35.6) ? Growth Restriction Nelda Marseille) ? Preeclampsia with severe features (34.0)  ? Prior classical CD, uterine window, rupture (36.0-37.6) ? Isolated (38.0-39.6) ? Chronic HTN (38.0-39.6)  ? Prior myomectomy (37.0-38.6) ? Concurrent findings (34.0-37.6) ? Cholestasis (37.0)  ? 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 Growth Restriction (Tw[place]) ? Diabetes  ? Placental abruption (chronic) ? Di-Di Isolated (36.0-37.6) ? Pregestational, controlled (39.0)  OBSTETRIC ISSUES ? Di-Di concurrent findings (32.0-34.6) ? Pregestational, uncontrolled (37.0-39.0)  ? Postdates ? (41 weeks) ? Mo-Di isolated (32.0-34.6) ?  Pregestational, vascular compromise (37.0- 39.0)  ? PPROM (34.0) ? Multiple Gestation ? Gestational, diet controlled (40.0)  ? Hx of IUFD (39.0 weeks) ? Di-Di (38.0-38.6) ? Gestational, med controlled (39.0)  ? Polyhydramnios, mild/moderate; SDV 8-16 or AFI 25-35 (39.0) ? Mo-Di (36.0-37.6) ? Gestational, uncontrolled (38.0-39.0)  ? Oligohydramnios (36.0-37.6); MVP <2 cm  For indications not listed above, delivery recommendations from maternal-fetal medicine consultant occurred on: Date:N/A  Provider Signature: Malachy Mood Scheduled by Date:05/29/2019 8:41 PM   Call [phone] to finalize the induction date/time  [id number] (07/17)

## 2019-05-29 NOTE — Telephone Encounter (Signed)
Patient states she is leaking fluids for about an hour now and is not sure if she is in labor, or if it has something to do with her UTI.

## 2019-05-29 NOTE — OB Triage Note (Signed)
Pt came to triage c/o of vaginal possible AROM. Pt went to bathroom around 1400 today because "I felt wet and I wanted to make sure it wasn't my water that broke. I sat down in the toilet and felt a gush. Ever since then I have been leaking a little bit". Pt reports some current discharge but no trickle down leg. Reports the fluid to be clear. Reports positive FM. Denies vaginal bleeding. Reports feeling pressure and occasional ctrx.

## 2019-05-29 NOTE — Progress Notes (Signed)
ROM Plus negative. Pt to discharge home on labor precautions. Induction of labor scheduled for Wednesday August 19th at 05:00am per Dr. Georgianne Fick. Pt agreed with plan of care. All questions answered.

## 2019-05-29 NOTE — Telephone Encounter (Signed)
Returned client's call-c/o having a "gush" of fluid when sitting on toilet and unsure if urine or not; also had more wetness in underwear later"a little"; denies contractions, only a "sharp" pain occ.; +FM; consulted E. Sciora, CNM and recommended to go to L & D for eval-client agrees Debera Lat, RN

## 2019-06-04 ENCOUNTER — Other Ambulatory Visit: Payer: Self-pay

## 2019-06-04 ENCOUNTER — Inpatient Hospital Stay: Payer: Medicaid Other | Admitting: Anesthesiology

## 2019-06-04 ENCOUNTER — Ambulatory Visit: Payer: Medicaid Other

## 2019-06-04 ENCOUNTER — Inpatient Hospital Stay
Admission: EM | Admit: 2019-06-04 | Discharge: 2019-06-06 | DRG: 806 | Disposition: A | Discharge status: Home / Self Care | Payer: Medicaid Other | Attending: Maternal Newborn | Admitting: Maternal Newborn

## 2019-06-04 DIAGNOSIS — O26893 Other specified pregnancy related conditions, third trimester: Secondary | ICD-10-CM | POA: Diagnosis present

## 2019-06-04 DIAGNOSIS — D62 Acute posthemorrhagic anemia: Secondary | ICD-10-CM | POA: Diagnosis not present

## 2019-06-04 DIAGNOSIS — O99214 Obesity complicating childbirth: Secondary | ICD-10-CM | POA: Diagnosis present

## 2019-06-04 DIAGNOSIS — O9081 Anemia of the puerperium: Secondary | ICD-10-CM | POA: Diagnosis not present

## 2019-06-04 DIAGNOSIS — Z87891 Personal history of nicotine dependence: Secondary | ICD-10-CM | POA: Diagnosis not present

## 2019-06-04 DIAGNOSIS — Z3A39 39 weeks gestation of pregnancy: Secondary | ICD-10-CM

## 2019-06-04 DIAGNOSIS — O99824 Streptococcus B carrier state complicating childbirth: Secondary | ICD-10-CM | POA: Diagnosis present

## 2019-06-04 DIAGNOSIS — Z3490 Encounter for supervision of normal pregnancy, unspecified, unspecified trimester: Secondary | ICD-10-CM | POA: Diagnosis present

## 2019-06-04 DIAGNOSIS — O0990 Supervision of high risk pregnancy, unspecified, unspecified trimester: Secondary | ICD-10-CM | POA: Diagnosis not present

## 2019-06-04 DIAGNOSIS — Z20828 Contact with and (suspected) exposure to other viral communicable diseases: Secondary | ICD-10-CM | POA: Diagnosis present

## 2019-06-04 DIAGNOSIS — O9902 Anemia complicating childbirth: Secondary | ICD-10-CM | POA: Diagnosis not present

## 2019-06-04 LAB — CBC
HCT: 33.3 % — ABNORMAL LOW (ref 36.0–46.0)
Hemoglobin: 11.3 g/dL — ABNORMAL LOW (ref 12.0–15.0)
MCH: 23.5 pg — ABNORMAL LOW (ref 26.0–34.0)
MCHC: 33.9 g/dL (ref 30.0–36.0)
MCV: 69.2 fL — ABNORMAL LOW (ref 80.0–100.0)
Platelets: 301 10*3/uL (ref 150–400)
RBC: 4.81 MIL/uL (ref 3.87–5.11)
RDW: 15.9 % — ABNORMAL HIGH (ref 11.5–15.5)
WBC: 12.1 10*3/uL — ABNORMAL HIGH (ref 4.0–10.5)
nRBC: 0 % (ref 0.0–0.2)

## 2019-06-04 LAB — TYPE AND SCREEN
ABO/RH(D): AB POS
Antibody Screen: NEGATIVE

## 2019-06-04 LAB — SARS CORONAVIRUS 2 BY RT PCR (HOSPITAL ORDER, PERFORMED IN ~~LOC~~ HOSPITAL LAB): SARS Coronavirus 2: NEGATIVE

## 2019-06-04 MED ORDER — OXYTOCIN 10 UNIT/ML IJ SOLN
INTRAMUSCULAR | Status: AC
  Filled 2019-06-04: qty 2

## 2019-06-04 MED ORDER — LIDOCAINE HCL (PF) 1 % IJ SOLN
INTRAMUSCULAR | Status: DC | PRN
  Administered 2019-06-04: 2 mL

## 2019-06-04 MED ORDER — LACTATED RINGERS IV SOLN
INTRAVENOUS | Status: DC
  Administered 2019-06-04 (×3): via INTRAVENOUS

## 2019-06-04 MED ORDER — PENICILLIN G 3 MILLION UNITS IVPB - SIMPLE MED
3.0000 10*6.[IU] | INTRAVENOUS | Status: DC
  Administered 2019-06-04 – 2019-06-05 (×4): 3 10*6.[IU] via INTRAVENOUS
  Filled 2019-06-04 (×6): qty 100

## 2019-06-04 MED ORDER — SODIUM CHLORIDE 0.9 % IV SOLN
INTRAVENOUS | Status: DC | PRN
  Administered 2019-06-04 (×3): 5 mL via EPIDURAL

## 2019-06-04 MED ORDER — TERBUTALINE SULFATE 1 MG/ML IJ SOLN: 0.2500 mg | Freq: Once | INTRAMUSCULAR | Status: DC | PRN

## 2019-06-04 MED ORDER — PENICILLIN G POTASSIUM 5000000 UNITS IJ SOLR
2.5000 10*6.[IU] | INTRAVENOUS | Status: DC
  Filled 2019-06-04 (×4): qty 2.5

## 2019-06-04 MED ORDER — EPHEDRINE 5 MG/ML INJ
10.0000 mg | INTRAVENOUS | Status: DC | PRN
  Filled 2019-06-04: qty 2

## 2019-06-04 MED ORDER — ACETAMINOPHEN 325 MG PO TABS
650.0000 mg | ORAL_TABLET | ORAL | Status: DC | PRN
  Administered 2019-06-05: 01:00:00 650 mg via ORAL
  Filled 2019-06-04: qty 2

## 2019-06-04 MED ORDER — FENTANYL CITRATE (PF) 100 MCG/2ML IJ SOLN: 50.0000 ug | INTRAMUSCULAR | Status: DC | PRN

## 2019-06-04 MED ORDER — FENTANYL 2.5 MCG/ML W/ROPIVACAINE 0.15% IN NS 100 ML EPIDURAL (ARMC)
12.0000 mL/h | EPIDURAL | Status: DC
  Filled 2019-06-04: qty 100

## 2019-06-04 MED ORDER — MISOPROSTOL 200 MCG PO TABS
ORAL_TABLET | ORAL | Status: AC
  Filled 2019-06-04: qty 4

## 2019-06-04 MED ORDER — OXYTOCIN BOLUS FROM INFUSION
500.0000 mL | Freq: Once | INTRAVENOUS | Status: DC
  Administered 2019-06-05: 02:00:00 500 mL via INTRAVENOUS

## 2019-06-04 MED ORDER — SOD CITRATE-CITRIC ACID 500-334 MG/5ML PO SOLN: 30.0000 mL | ORAL | Status: DC | PRN

## 2019-06-04 MED ORDER — PHENYLEPHRINE 40 MCG/ML (10ML) SYRINGE FOR IV PUSH (FOR BLOOD PRESSURE SUPPORT)
80.0000 ug | PREFILLED_SYRINGE | INTRAVENOUS | Status: DC | PRN
  Filled 2019-06-04: qty 10

## 2019-06-04 MED ORDER — FENTANYL 2.5 MCG/ML W/ROPIVACAINE 0.15% IN NS 100 ML EPIDURAL (ARMC)
EPIDURAL | Status: DC | PRN
  Administered 2019-06-04: 12 mL/h via EPIDURAL

## 2019-06-04 MED ORDER — LACTATED RINGERS IV SOLN: 500.0000 mL | Freq: Once | INTRAVENOUS | Status: DC

## 2019-06-04 MED ORDER — FENTANYL 2.5 MCG/ML W/ROPIVACAINE 0.15% IN NS 100 ML EPIDURAL (ARMC)
EPIDURAL | Status: AC
  Filled 2019-06-04: qty 100

## 2019-06-04 MED ORDER — AMMONIA AROMATIC IN INHA
RESPIRATORY_TRACT | Status: AC
  Filled 2019-06-04: qty 10

## 2019-06-04 MED ORDER — LIDOCAINE HCL (PF) 1 % IJ SOLN
30.0000 mL | INTRAMUSCULAR | Status: DC | PRN
  Filled 2019-06-04: qty 30

## 2019-06-04 MED ORDER — DIPHENHYDRAMINE HCL 50 MG/ML IJ SOLN: 12.5000 mg | INTRAMUSCULAR | Status: DC | PRN

## 2019-06-04 MED ORDER — LACTATED RINGERS IV SOLN: 500.0000 mL | INTRAVENOUS | Status: DC | PRN

## 2019-06-04 MED ORDER — OXYTOCIN 40 UNITS IN NORMAL SALINE INFUSION - SIMPLE MED
2.5000 [IU]/h | INTRAVENOUS | Status: DC
  Filled 2019-06-04: qty 1000

## 2019-06-04 MED ORDER — OXYTOCIN 40 UNITS IN NORMAL SALINE INFUSION - SIMPLE MED
1.0000 m[IU]/min | INTRAVENOUS | Status: DC
  Administered 2019-06-04: 16:00:00 1 m[IU]/min via INTRAVENOUS

## 2019-06-04 MED ORDER — SODIUM CHLORIDE 0.9 % IV SOLN
5.0000 10*6.[IU] | Freq: Once | INTRAVENOUS | Status: AC
  Administered 2019-06-04: 08:00:00 5 10*6.[IU] via INTRAVENOUS
  Filled 2019-06-04: qty 5

## 2019-06-04 MED ORDER — MISOPROSTOL 100 MCG PO TABS
25.0000 ug | ORAL_TABLET | ORAL | Status: DC | PRN
  Administered 2019-06-04 (×2): 25 ug via VAGINAL
  Filled 2019-06-04 (×3): qty 1

## 2019-06-04 MED ORDER — ONDANSETRON HCL 4 MG/2ML IJ SOLN: 4.0000 mg | Freq: Four times a day (QID) | INTRAMUSCULAR | Status: DC | PRN

## 2019-06-04 NOTE — H&P (Signed)
Obstetrics Admission History & Physical     HPI:  23 y.o. G2P1001 @ [redacted]w[redacted]d (06/11/2019, Date entered prior to episode creation). Admitted on 06/04/2019:   Patient Active Problem List   Diagnosis Date Noted  . Encounter for elective induction of labor 06/04/2019  . Labor and delivery indication for care or intervention 05/29/2019  . Breech presentation 05/19/2019  . Group B Streptococcus carrier state affecting pregnancy 05/16/2019  . Supervision of high risk pregnancy, antepartum 04/26/2019  . Mood disorder ([place]) 04/26/2019  . Anemia affecting pregnancy, antepartum 04/26/2019  . Morbid obesity with BMI of 40.0-44.9, adult ([place]) 04/08/2019  . 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 Type AB blood, Rh positive 04/08/2019  . Pica 04/08/2019  . Iron deficiency anemia 03/14/2019     Presents for elective induction of labor at term. She is having occasional contractions. She has not had any vaginal bleeding or loss of fluid. Her baby is moving well.  Prenatal care at: at [facility]. Pregnancy complicated by anemia, hemoglobin C trait, obesity, breech presentation with successful ECV.  ROS: A review of systems was performed and negative, except as stated in the above HPI.  PMHx:  Past Medical History:  Diagnosis Date  . Anemia   . Depression    is past hx but not active now per pt  . Morbidly obese ([place])    notes from health dept as hx   PSHx:  Past Surgical History:  Procedure Laterality Date  . COLONOSCOPY WITH PROPOFOL N/A 06/15/2017   Procedure: COLONOSCOPY WITH PROPOFOL;  Surgeon: [name], MD;  Location: [facility] ENDOSCOPY;  Service: Gastroenterology;  Laterality: N/A;  . ESOPHAGOGASTRODUODENOSCOPY (EGD) WITH PROPOFOL N/A 06/15/2017   Procedure: ESOPHAGOGASTRODUODENOSCOPY (EGD) WITH PROPOFOL;  Surgeon: [name], MD;  Location: [facility] ENDOSCOPY;  Service: Gastroenterology;  Laterality: N/A;  . TONSILLECTOMY     Medications:  Medications Prior to Admission  Medication Sig Dispense  Refill Last Dose  . Prenatal Vit-Fe Fumarate-FA (PRENATAL VITAMIN PO) Take 1 tablet by mouth daily.   06/03/2019 at Unknown time  . cyclobenzaprine (FLEXERIL) 10 MG tablet Take 1 tablet (10 mg total) by mouth 3 (three) times daily as needed for muscle spasms. (Patient not taking: Reported on 05/29/2019) 30 tablet 2 Not Taking at Unknown time  . nitrofurantoin, macrocrystal-monohydrate, (MACROBID) 100 MG capsule Take 1 capsule (100 mg total) by mouth every 12 (twelve) hours. (Patient not taking: Reported on 06/04/2019) 10 capsule 0 Not Taking at Unknown time   Allergies: has No Known Allergies. OBHx:  OB History  Gravida Para Term Preterm AB Living  2 1 1     1   SAB TAB Ectopic Multiple Live Births        0 1    # Outcome Date GA Lbr Len/2nd Weight Sex Delivery Anes PTL Lv  2 Current           1 Term 10/24/16 [redacted]w[redacted]d 07:53 / 00:07 3230 g F Vag-Spont EPI  LIV   Family History  Problem Relation Age of Onset  . Thyroid disease Mother   . Hypertension Mother   . Sickle cell trait Mother   . Sickle cell anemia Sister   . Hypertension Maternal Grandmother    Soc Hx: Former smoker, Alcohol: none and Recreational drug use: former (marijuana)  Objective:   Vitals:   06/04/19 0538 06/04/19 0721  BP: 115/71 126/69  Pulse: 92 88  Resp: 16 15  Temp: 98.6 F (37 C) 97.7 F (36.5 C)   Constitutional: Well  nourished, well developed female in no acute distress.  HEENT: normal Skin: Warm and dry.  Cardiovascular: Regular rate and rhythm.   Extremity: trace to 1+ bilateral pedal edema Respiratory: Clear to auscultation bilaterally. Normal respiratory effort Abdomen: gravid, non-tender Neuro: Cranial nerves grossly intact Psych: Alert and Oriented x3. No memory deficits. Normal mood and affect.  MS: normal gait, normal bilateral lower extremity ROM/strength/stability.  Pelvic exam: Last exam 2/50/-3  EFM: FHR: 130 bpm, variability: moderate,  accelerations:  Present,  decelerations:   Absent Toco: occasional contractions   Perinatal info:  Blood type: AB positive Rubella - Immune Varicella - Immune TDaP Given during third trimester of this pregnancy on 03/14/2019 RPR NR / HIV Neg/ HBsAg Neg   Assessment & Plan:   23 y.o. G2P1001 @ [redacted]w[redacted]d, Admitted on 06/04/2019 for an elective induction of labor.    Antibiotics for GBS prophylaxis, Observe for cervical change, Fetal Wellbeing Reassuring, Epidural when ready and AROM when Appropriate  Induction was begun with Cytotec at 0719.  Avel Sensor, CNM [facility], [facility] 06/04/2019  9:30 AM

## 2019-06-04 NOTE — Anesthesia Preprocedure Evaluation (Signed)
Anesthesia Evaluation  Patient identified by MRN, date of birth, ID band Patient awake    Reviewed: Allergy & Precautions, H&P , NPO status , Patient's Chart, lab work & pertinent test results  Airway Mallampati: II  TM Distance: >3 FB Neck ROM: full    Dental  (+) Teeth Intact   Pulmonary neg pulmonary ROS, former smoker,           Cardiovascular Exercise Tolerance: Good negative cardio ROS       Neuro/Psych PSYCHIATRIC DISORDERS Depression    GI/Hepatic negative GI ROS,   Endo/Other  Morbid obesity (BMI 45)  Renal/GU   negative genitourinary   Musculoskeletal   Abdominal   Peds  Hematology  (+) Blood dyscrasia, anemia ,   Anesthesia Other Findings Past Medical History: No date: Anemia No date: Depression     Comment:  is past hx but not active now per pt No date: Morbidly obese ([place])     Comment:  notes from health dept as hx  Past Surgical History: 06/15/2017: COLONOSCOPY WITH PROPOFOL; N/A     Comment:  Procedure: COLONOSCOPY WITH PROPOFOL;  Surgeon: [name], MD;  Location: [facility] ENDOSCOPY;  Service:               Gastroenterology;  Laterality: N/A; 06/15/2017: ESOPHAGOGASTRODUODENOSCOPY (EGD) WITH PROPOFOL; N/A     Comment:  Procedure: ESOPHAGOGASTRODUODENOSCOPY (EGD) WITH               PROPOFOL;  Surgeon: [name], MD;  Location: [facility]               ENDOSCOPY;  Service: Gastroenterology;  Laterality: N/A; No date: TONSILLECTOMY  BMI    Body Mass Index: 44.92 kg/m      Reproductive/Obstetrics (+) Pregnancy                             Anesthesia Physical Anesthesia Plan  ASA: III  Anesthesia Plan: Epidural   Post-op Pain Management:    Induction:   PONV Risk Score and Plan:   Airway Management Planned:   Additional Equipment:   Intra-op Plan:   Post-operative Plan:   Informed Consent: I have reviewed the patients History and Physical,  chart, labs and discussed the procedure including the risks, benefits and alternatives for the proposed anesthesia with the patient or authorized representative who has indicated his/her understanding and acceptance.       Plan Discussed with: Anesthesiologist  Anesthesia Plan Comments:         Anesthesia Quick Evaluation

## 2019-06-04 NOTE — Anesthesia Procedure Notes (Signed)
Epidural Patient location during procedure: OB Start time: 06/04/2019 7:35 PM End time: 06/04/2019 7:51 PM  Staffing Anesthesiologist: Durenda Hurt, MD  Preanesthetic Checklist Completed: patient identified, site marked, surgical consent, pre-op evaluation, timeout performed, IV checked, risks and benefits discussed and monitors and equipment checked  Epidural Patient position: sitting Prep: Betadine Patient monitoring: heart rate, continuous pulse ox and blood pressure Approach: midline Location: L4-L5 Injection technique: LOR saline  Needle:  Needle type: Tuohy  Needle gauge: 18 G Needle length: 9 cm and 9 Needle insertion depth: 7 cm Catheter type: closed end flexible Catheter size: 20 Guage Catheter at skin depth: 12 cm Test dose: negative and 1.5% lidocaine with Epi 1:200 K  Assessment Events: blood not aspirated, injection not painful, no injection resistance, negative IV test and no paresthesia  Additional Notes   Patient tolerated the insertion well without complications.Reason for block:procedure for pain

## 2019-06-05 DIAGNOSIS — O0990 Supervision of high risk pregnancy, unspecified, unspecified trimester: Secondary | ICD-10-CM | POA: Diagnosis not present

## 2019-06-05 LAB — CBC
HCT: 32.5 % — ABNORMAL LOW (ref 36.0–46.0)
Hemoglobin: 11.3 g/dL — ABNORMAL LOW (ref 12.0–15.0)
MCH: 24 pg — ABNORMAL LOW (ref 26.0–34.0)
MCHC: 34.8 g/dL (ref 30.0–36.0)
MCV: 69 fL — ABNORMAL LOW (ref 80.0–100.0)
Platelets: 299 10*3/uL (ref 150–400)
RBC: 4.71 MIL/uL (ref 3.87–5.11)
RDW: 15.9 % — ABNORMAL HIGH (ref 11.5–15.5)
WBC: 20.8 10*3/uL — ABNORMAL HIGH (ref 4.0–10.5)
nRBC: 0 % (ref 0.0–0.2)

## 2019-06-05 LAB — RPR: RPR Ser Ql: NONREACTIVE

## 2019-06-05 MED ORDER — ACETAMINOPHEN 325 MG PO TABS
650.0000 mg | ORAL_TABLET | ORAL | Status: DC | PRN
  Administered 2019-06-05 – 2019-06-06 (×4): 650 mg via ORAL
  Filled 2019-06-05 (×4): qty 2

## 2019-06-05 MED ORDER — WITCH HAZEL-GLYCERIN EX PADS: 1.0000 "application " | MEDICATED_PAD | CUTANEOUS | Status: DC | PRN

## 2019-06-05 MED ORDER — SENNOSIDES-DOCUSATE SODIUM 8.6-50 MG PO TABS
2.0000 | ORAL_TABLET | ORAL | Status: DC
  Administered 2019-06-05: 23:00:00 2 via ORAL
  Filled 2019-06-05: qty 2

## 2019-06-05 MED ORDER — PRENATAL MULTIVITAMIN CH
1.0000 | ORAL_TABLET | Freq: Every day | ORAL | Status: DC
  Administered 2019-06-05 – 2019-06-06 (×2): 1 via ORAL
  Filled 2019-06-05 (×2): qty 1

## 2019-06-05 MED ORDER — IBUPROFEN 600 MG PO TABS
600.0000 mg | ORAL_TABLET | Freq: Four times a day (QID) | ORAL | Status: DC
  Administered 2019-06-05 – 2019-06-06 (×6): 600 mg via ORAL
  Filled 2019-06-05 (×6): qty 1

## 2019-06-05 MED ORDER — SIMETHICONE 80 MG PO CHEW: 80.0000 mg | CHEWABLE_TABLET | ORAL | Status: DC | PRN

## 2019-06-05 MED ORDER — ONDANSETRON HCL 4 MG/2ML IJ SOLN: 4.0000 mg | INTRAMUSCULAR | Status: DC | PRN

## 2019-06-05 MED ORDER — DIBUCAINE (PERIANAL) 1 % EX OINT: 1.0000 "application " | TOPICAL_OINTMENT | CUTANEOUS | Status: DC | PRN

## 2019-06-05 MED ORDER — TETANUS-DIPHTH-ACELL PERTUSSIS 5-2.5-18.5 LF-MCG/0.5 IM SUSP: 0.5000 mL | INTRAMUSCULAR | Status: DC | PRN

## 2019-06-05 MED ORDER — OXYCODONE-ACETAMINOPHEN 5-325 MG PO TABS: 1.0000 | ORAL_TABLET | ORAL | Status: DC | PRN

## 2019-06-05 MED ORDER — OXYCODONE-ACETAMINOPHEN 5-325 MG PO TABS: 2.0000 | ORAL_TABLET | ORAL | Status: DC | PRN

## 2019-06-05 MED ORDER — DIPHENHYDRAMINE HCL 25 MG PO CAPS: 25.0000 mg | ORAL_CAPSULE | Freq: Four times a day (QID) | ORAL | Status: DC | PRN

## 2019-06-05 MED ORDER — COCONUT OIL OIL: 1.0000 "application " | TOPICAL_OIL | Status: DC | PRN

## 2019-06-05 MED ORDER — ONDANSETRON HCL 4 MG PO TABS: 4.0000 mg | ORAL_TABLET | ORAL | Status: DC | PRN

## 2019-06-05 MED ORDER — BENZOCAINE-MENTHOL 20-0.5 % EX AERO: 1.0000 "application " | INHALATION_SPRAY | CUTANEOUS | Status: DC | PRN

## 2019-06-05 NOTE — Progress Notes (Signed)
Subjective:  Doing well postpartum day 0, 8 hours She is tolerating regular diet and her pain is controlled with PO medication. She is ambulating and voiding without difficulty. She and her partner are adjusting to the surprise of having a girl instead of a boy as determined by ultrasound!  Objective:  Vital signs in last 24 hours: Temp:  [97.7 F (36.5 C)-100.9 F (38.3 C)] 98.4 F (36.9 C) (08/20 1106) Pulse Rate:  [84-170] 84 (08/20 1106) Resp:  [16-18] 18 (08/20 1106) BP: (99-132)/(49-78) 112/66 (08/20 1106) SpO2:  [96 %-100 %] 100 % (08/20 0725)    General: NAD Pulmonary: no increased work of breathing Abdomen: non-distended, non-tender, fundus firm at level of umbilicus Extremities: no edema, no erythema, no tenderness  Results for orders placed or performed during the hospital encounter of 06/04/19 (from the past 72 hour(s))  SARS Coronavirus 2 [facility] order, Performed in [facility] hospital lab) Nasopharyngeal Nasopharyngeal Swab     Status: None   Collection Time: 06/04/19  5:59 AM   Specimen: Nasopharyngeal Swab  Result Value Ref Range   SARS Coronavirus 2 NEGATIVE NEGATIVE    Comment: (NOTE) If result is NEGATIVE SARS-CoV-2 target nucleic acids are NOT DETECTED. The SARS-CoV-2 RNA is generally detectable in upper and lower  respiratory specimens during the acute phase of infection. The lowest  concentration of SARS-CoV-2 viral copies this assay can detect is 250  copies / mL. A negative result does not preclude SARS-CoV-2 infection  and should not be used as the sole basis for treatment or other  patient management decisions.  A negative result may occur with  improper specimen collection / handling, submission of specimen other  than nasopharyngeal swab, presence of viral mutation(s) within the  areas targeted by this assay, and inadequate number of viral copies  (<250 copies / mL). A negative result must be combined with clinical  observations, patient  history, and epidemiological information. If result is POSITIVE SARS-CoV-2 target nucleic acids are DETECTED. The SARS-CoV-2 RNA is generally detectable in upper and lower  respiratory specimens dur ing the acute phase of infection.  Positive  results are indicative of active infection with SARS-CoV-2.  Clinical  correlation with patient history and other diagnostic information is  necessary to determine patient infection status.  Positive results do  not rule out bacterial infection or co-infection with other viruses. If result is PRESUMPTIVE POSTIVE SARS-CoV-2 nucleic acids MAY BE PRESENT.   A presumptive positive result was obtained on the submitted specimen  and confirmed on repeat testing.  While 2019 novel coronavirus  (SARS-CoV-2) nucleic acids may be present in the submitted sample  additional confirmatory testing may be necessary for epidemiological  and / or clinical management purposes  to differentiate between  SARS-CoV-2 and other Sarbecovirus currently known to infect humans.  If clinically indicated additional testing with an alternate test  methodology [phone]) is advised. The SARS-CoV-2 RNA is generally  detectable in upper and lower respiratory sp ecimens during the acute  phase of infection. The expected result is Negative. Fact Sheet for Patients:  StrictlyIdeas.no Fact Sheet for Healthcare Providers: BankingDealers.co.za This test is not yet approved or cleared by the [place] FDA and has been authorized for detection and/or diagnosis of SARS-CoV-2 by FDA under an Emergency Use Authorization (EUA).  This EUA will remain in effect (meaning this test can be used) for the duration of the COVID-19 declaration under Section 564(b)(1) of the Act, 21 U.S.C. section 360bbb-3(b)(1), unless the authorization is  terminated or revoked sooner. Performed at [facility], [address] [place], [place] [address]    CBC     Status: Abnormal   Collection Time: 06/04/19  6:31 AM  Result Value Ref Range   WBC 12.1 (H) 4.0 - 10.5 K/uL   RBC 4.81 3.87 - 5.11 MIL/uL   Hemoglobin 11.3 (L) 12.0 - 15.0 g/dL   HCT 33.3 (L) 36.0 - 46.0 %   MCV 69.2 (L) 80.0 - 100.0 fL   MCH 23.5 (L) 26.0 - 34.0 pg   MCHC 33.9 30.0 - 36.0 g/dL   RDW 15.9 (H) 11.5 - 15.5 %   Platelets 301 150 - 400 K/uL   nRBC 0.0 0.0 - 0.2 %    Comment: Performed at [facility] [address] [place], [place] [address]  RPR     Status: None   Collection Time: 06/04/19  6:31 AM  Result Value Ref Range   RPR Ser Ql Non Reactive Non Reactive    Comment: (NOTE) Performed At: [facility] [address] [place], [place] [id number] Rush Farmer MD [id number]   Type and screen     Status: None   Collection Time: 06/04/19  6:31 AM  Result Value Ref Range   ABO/RH(D) AB POS    Antibody Screen NEG    Sample Expiration      06/07/2019,2359 Performed at [facility], [address] [place], [place] [address]   CBC     Status: Abnormal   Collection Time: 06/05/19  6:36 AM  Result Value Ref Range   WBC 20.8 (H) 4.0 - 10.5 K/uL   RBC 4.71 3.87 - 5.11 MIL/uL   Hemoglobin 11.3 (L) 12.0 - 15.0 g/dL   HCT 32.5 (L) 36.0 - 46.0 %   MCV 69.0 (L) 80.0 - 100.0 fL   MCH 24.0 (L) 26.0 - 34.0 pg   MCHC 34.8 30.0 - 36.0 g/dL   RDW 15.9 (H) 11.5 - 15.5 %   Platelets 299 150 - 400 K/uL   nRBC 0.0 0.0 - 0.2 %    Comment: Performed at [facility], [address], [place], [place] [address]    Assessment:   23 y.o. [phone] postpartum day # 0  Plan:    1) Acute blood loss anemia - hemodynamically stable and asymptomatic - po ferrous sulfate  2) AB positive, Rubella Immune, Varicella Immune  3) TDAP status: give postpartum  4) Feeding plan: Formula  5)  Education given regarding options for contraception, as well as compatibility with breast feeding if applicable.  Patient plans on Depo-Provera  injections for contraception.  6) Disposition: continue routine postpartum care   Rod Can, [facility] 06/05/2019, 11:46 AM

## 2019-06-05 NOTE — Discharge Summary (Signed)
OB Discharge Summary     Patient Name: Cindy [name] DOB: 04-23-1996 MRN: [id number]  Date of admission: 06/04/2019 Delivering Provider: Rexene Agent, CNM  Date of Delivery: 06/05/2019  Date of discharge: 06/06/2019  Admitting diagnosis: Induction  Intrauterine pregnancy: [redacted]w[redacted]d     Secondary diagnosis: None     Discharge diagnosis: Term Pregnancy [place] [facility] course:  Induction of Labor With Vaginal Delivery   23 y.o. yo [id number] at [redacted]w[redacted]d was admitted to the hospital 06/04/2019 for induction of labor.  Indication for induction: Favorable cervix at term.  Patient had an uncomplicated labor course as follows: Membrane Rupture Time/Date: 6:22 PM ,06/04/2019   Intrapartum Procedures: Episiotomy: None [1]                                         Lacerations:  None [1]  Patient had delivery of a Viable infant.  Information for the patient's newborn:  Janazia, Schreier [[id number]]  Delivery Method: Vag-Spont    06/05/2019  Details of delivery can be found in separate delivery note.  Patient had a routine postpartum course. Patient is discharged home 06/06/19.                                                                 Post partum procedures:Depo provera  Complications: None  Physical exam on 06/06/2019: Vitals:   06/05/19 1106 06/05/19 1520 06/05/19 2317 06/06/19 0800  BP: 112/66 139/67 117/69 122/69  Pulse: 84 83 60 71  Resp: 18 18 20 18   Temp: 98.4 F (36.9 C) 98.2 F (36.8 C) 97.6 F (36.4 C) 97.7 F (36.5 C)  TempSrc: Oral Oral Oral Oral  SpO2:   100% 100%  Weight:      Height:       General: alert, cooperative and no distress Lochia: appropriate Uterine Fundus: firm Incision: N/A DVT Evaluation: No evidence of DVT seen on physical exam.  Labs: Lab Results  Component Value Date   WBC 20.8 (H) 06/05/2019   HGB 11.3 (L) 06/05/2019   HCT 32.5 (L) 06/05/2019   MCV 69.0 (L) 06/05/2019   PLT 299 06/05/2019   CMP Latest Ref Rng &  Units 01/22/2019  Glucose 70 - 99 mg/dL 108(H)  BUN 6 - 20 mg/dL 5(L)  Creatinine 0.44 - 1.00 mg/dL 0.46  Sodium 135 - 145 mmol/L 137  Potassium 3.5 - 5.1 mmol/L 3.8  Chloride 98 - 111 mmol/L 106  CO2 22 - 32 mmol/L 20(L)  Calcium 8.9 - 10.3 mg/dL 8.7(L)  Total Protein 6.5 - 8.1 g/dL 6.8  Total Bilirubin 0.3 - 1.2 mg/dL 0.6  Alkaline Phos 38 - 126 U/L 62  AST 15 - 41 U/L 23  ALT 0 - 44 U/L 21    Discharge instruction: per After Visit Summary.  Medications:  Allergies as of 06/06/2019   No Known Allergies     Medication List    STOP taking these medications   cyclobenzaprine 10 MG tablet Commonly known as: FLEXERIL   nitrofurantoin (macrocrystal-monohydrate) 100 MG capsule Commonly known as: MACROBID  TAKE these medications   acetaminophen 325 MG tablet Commonly known as: Tylenol Take 2 tablets (650 mg total) by mouth every 6 (six) hours as needed for mild pain, moderate pain or headache (for pain scale < 4).   ibuprofen 600 MG tablet Commonly known as: ADVIL Take 1 tablet (600 mg total) by mouth every 6 (six) hours as needed for mild pain, moderate pain or cramping.   PRENATAL VITAMIN PO Take 1 tablet by mouth daily.       Diet: routine diet  Activity: Advance as tolerated. Pelvic rest for 6 weeks.   Outpatient follow up: Follow-up Information    [facility] DEPT. Schedule an appointment as soon as possible for a visit in 2 week(s).   Why: please call and schedule an appointment for 2 weeks for a Mood check/postpartum visit Contact information: [address] [place] [address] [phone]            Postpartum contraception: Depo Provera Rhogam Given postpartum: no Rubella vaccine given postpartum: no Varicella vaccine given postpartum: no TDaP given antepartum or postpartum: Yes, AP  Newborn Data: Live born female [facility] Birth Weight: 6 lb 10.9 oz (3030 g) APGAR: 8, 9  Newborn Delivery    Birth date/time: 06/05/2019 01:40:00 Delivery type: Vaginal, Spontaneous       Baby Feeding: Formula  Disposition:home with mother  SIGNED:  Dalia Heading, CNM 06/06/2019 3:01 PM

## 2019-06-05 NOTE — Lactation Note (Signed)
This note was copied from a baby's chart. Lactation Consultation Note  Patient Name: Girl [place] Sarinana DCVUD'T Date: 06/05/2019   [facility] spoke with MOB regarding feedings. Mom states that baby has been sleepy since birth, 2 feedings so far with the maximum given of 46mL. Mom plans to try feeding again around 2:45pm. Mom states that baby grasps the bottle easily, and no trouble eating from the bottle.  Discussed with mom recommendations for boiling water, cooling and mixing formula, formula usage within the first 2 hours, and then storage in refrigerator if necessary. Discussed importance of clean work space to prepare formula, and cleaning and sterilization of bottles and nipples. Reviewed infant hunger/feeding cues, newborn stomach size, and feeding behaviors. Mom acknowledges information.  Maternal Data    Feeding Feeding Type: Bottle Fed - Formula Nipple Type: Slow - flow  LATCH Score                   Interventions    Lactation Tools Discussed/Used     Consult Status      Lavonia Drafts 06/05/2019, 2:29 PM

## 2019-06-06 MED ORDER — ACETAMINOPHEN 325 MG PO TABS: 650.0000 mg | ORAL_TABLET | Freq: Four times a day (QID) | ORAL | 0 refills | Status: DC | PRN

## 2019-06-06 MED ORDER — IBUPROFEN 600 MG PO TABS: 600.0000 mg | ORAL_TABLET | Freq: Four times a day (QID) | ORAL | 1 refills | Status: DC | PRN

## 2019-06-06 MED ORDER — MEDROXYPROGESTERONE ACETATE 150 MG/ML IM SUSP
150.0000 mg | Freq: Once | INTRAMUSCULAR | Status: AC
  Administered 2019-06-06: 17:00:00 150 mg via INTRAMUSCULAR
  Filled 2019-06-06: qty 1

## 2019-06-06 MED ORDER — FERROUS SULFATE 325 (65 FE) MG PO TABS: 325.0000 mg | ORAL_TABLET | Freq: Every day | ORAL | Status: DC

## 2019-06-06 NOTE — Anesthesia Postprocedure Evaluation (Signed)
Anesthesia Post Note  Patient: Cindy [name]  Procedure(s) Performed: AN AD HOC EPIDURAL  Patient location during evaluation: Mother Baby Anesthesia Type: Epidural Level of consciousness: awake, awake and alert and oriented Pain management: pain level controlled Vital Signs Assessment: post-procedure vital signs reviewed and stable Respiratory status: spontaneous breathing, nonlabored ventilation and respiratory function stable Cardiovascular status: blood pressure returned to baseline and stable Postop Assessment: no headache and no backache     Last Vitals:  Vitals:   06/05/19 1520 06/05/19 2317  BP: 139/67 117/69  Pulse: 83 60  Resp: 18 20  Temp: 36.8 C 36.4 C  SpO2:  100%    Last Pain:  Vitals:   06/05/19 2317  TempSrc: Oral  PainSc:                  [name]

## 2019-06-06 NOTE — Progress Notes (Signed)
Patient wheeled out by NT 

## 2019-06-06 NOTE — Progress Notes (Signed)
Post Partum Day 1 Subjective: up ad lib, voiding and tolerating PO Using heating pad and ibuprofen for lower back pain. Baby with some problems keeping temperature up. Bottle feeding  Objective: Blood pressure 122/69, pulse 71, temperature 97.7 F (36.5 C), temperature source Oral, resp. rate 18, height 5' (1.524 m), weight 104.3 kg, last menstrual period 09/04/2018, SpO2 100 %, unknown if currently breastfeeding.  Physical Exam:  General: alert, cooperative and no distress Lochia: appropriate Uterine Fundus: firm/ U-1/ML/NT  DVT Evaluation: No evidence of DVT seen on physical exam.  Recent Labs    06/04/19 0631 06/05/19 0636  HGB 11.3* 11.3*  HCT 33.3* 32.5*  WBC 12.1* 20.8*  PLT 301 299    Assessment/Plan: Stable PPD #1-continue postpartum care Chronic anemia-continue vitamins and iron Will wait for peds to round. If baby OK for discharge, patient would like to go home. AB POS/RI/VI TDAP 03/14/2019 Bottle Depo   LOS: 2 days   Dalia Heading 06/06/2019, 8:57 AM

## 2019-06-06 NOTE — Anesthesia Postprocedure Evaluation (Signed)
Anesthesia Post Note  Patient: Cindy [name]  Procedure(s) Performed: AN AD HOC LABOR EPIDURAL  Patient location during evaluation: Mother Baby Anesthesia Type: Epidural Level of consciousness: awake and awake and alert Pain management: pain level controlled Vital Signs Assessment: post-procedure vital signs reviewed and stable Respiratory status: spontaneous breathing and nonlabored ventilation Cardiovascular status: blood pressure returned to baseline and stable Postop Assessment: no headache and no backache Anesthetic complications: no     Last Vitals:  Vitals:   06/05/19 1520 06/05/19 2317  BP: 139/67 117/69  Pulse: 83 60  Resp: 18 20  Temp: 36.8 C 36.4 C  SpO2:  100%    Last Pain:  Vitals:   06/05/19 2317  TempSrc: Oral  PainSc:                  Cindy [name]

## 2019-06-06 NOTE — Progress Notes (Signed)
Patient discharged home with infant. Discharge instructions, prescriptions and follow up appointment given to and reviewed with patient. Patient verbalized understanding.   Patient waiting for ride

## 2019-06-19 ENCOUNTER — Inpatient Hospital Stay: Payer: Medicaid Other | Admitting: Oncology

## 2019-06-19 ENCOUNTER — Inpatient Hospital Stay: Payer: Medicaid Other | Attending: Oncology

## 2019-06-19 NOTE — Telephone Encounter (Signed)
LM for PT to call back to R/S N/S appt 06/19/2019-ltg

## 2019-07-03 NOTE — Addendum Note (Signed)
Addended by: Cletis Media on: 07/03/2019 08:35 AM   Modules accepted: Orders

## 2019-07-17 ENCOUNTER — Encounter: Payer: Self-pay | Admitting: Family Medicine

## 2019-07-17 ENCOUNTER — Other Ambulatory Visit: Payer: Self-pay

## 2019-07-17 ENCOUNTER — Telehealth: Payer: Self-pay | Admitting: Family Medicine

## 2019-07-17 ENCOUNTER — Ambulatory Visit: Payer: Medicaid Other | Admitting: Family Medicine

## 2019-07-17 DIAGNOSIS — D509 Iron deficiency anemia, unspecified: Secondary | ICD-10-CM | POA: Diagnosis not present

## 2019-07-17 DIAGNOSIS — Z6841 Body Mass Index (BMI) 40.0 and over, adult: Secondary | ICD-10-CM

## 2019-07-17 DIAGNOSIS — Z30013 Encounter for initial prescription of injectable contraceptive: Secondary | ICD-10-CM

## 2019-07-17 DIAGNOSIS — O0990 Supervision of high risk pregnancy, unspecified, unspecified trimester: Secondary | ICD-10-CM

## 2019-07-17 DIAGNOSIS — O99345 Other mental disorders complicating the puerperium: Secondary | ICD-10-CM

## 2019-07-17 DIAGNOSIS — F53 Postpartum depression: Secondary | ICD-10-CM

## 2019-07-17 DIAGNOSIS — F39 Unspecified mood [affective] disorder: Secondary | ICD-10-CM

## 2019-07-17 LAB — HEMOGLOBIN, FINGERSTICK: Hemoglobin: 11.9 g/dL (ref 11.1–15.9)

## 2019-07-17 MED ORDER — THERA VITAL M PO TABS: 1.0000 | ORAL_TABLET | Freq: Every day | ORAL | 0 refills | Status: DC

## 2019-07-17 MED ORDER — SERTRALINE HCL 25 MG PO TABS: 25.0000 mg | ORAL_TABLET | Freq: Every day | ORAL | 4 refills | Status: DC

## 2019-07-17 MED ORDER — MEDROXYPROGESTERONE ACETATE 150 MG/ML IM SUSP: 150.0000 mg | INTRAMUSCULAR | Status: DC

## 2019-07-17 NOTE — Progress Notes (Signed)
Post Partum Exam  Cindy [name] is a 23 y.o. G56P2002 female who presents for a postpartum visit. She is 6 weeks postpartum following a spontaneous vaginal delivery. I have fully reviewed the prenatal and intrapartum course. The delivery was at 96 gestational weeks.  Anesthesia: epidural. Postpartum course has been uncomplicated. Baby's course has been uncomplicted. Baby is feeding by bottle-formula. Bleeding no bleeding. Bowel function is normal. Bladder function is normal. Patient is not sexually active. Contraception method is Depo-Provera injections.   Postpartum depression screening:POS Edinburgh Postnatal Depression Scale - 07/17/19 0915      Edinburgh Postnatal Depression Scale:  In the Past 7 Days   I have been able to laugh and see the funny side of things.  1    I have looked forward with enjoyment to things.  0    I have blamed myself unnecessarily when things went wrong.  2    I have been anxious or worried for no good reason.  2    I have felt scared or panicky for no good reason.  2    Things have been getting on top of me.  2    I have been so unhappy that I have had difficulty sleeping.  2    I have felt sad or miserable.  3    I have been so unhappy that I have been crying.  2    The thought of harming myself has occurred to me.  0    Edinburgh Postnatal Depression Scale Total  16          The following portions of the patient's history were reviewed and updated as appropriate: allergies, current medications, past family history, past medical history, past social history, past surgical history and problem list.  Has never had a pap - due now but due to profound depression was not performed today  Review of Systems Pertinent items are noted in HPI.    Objective:  BP 117/66   Ht 5' (1.524 m)   Wt 229 lb 3.2 oz (104 kg)   BMI 44.76 kg/m   General:  alert, cooperative and appears stated age   Breasts:  Not inspected  Lungs: normal WOB  Heart:  regular rate and  rhythm, S1, S2 normal, no murmur, click, rub or gallop  Abdomen: soft, non-tender; bowel sounds normal; no masses,  no organomegaly   Vulva:  normal  Vagina: not evaluated  Cervix:  not evaluated  Corpus: not examined  Adnexa:  normal adnexa  Rectal Exam: Not performed.        Assessment:    Normal postpartum exam. Pap smear not done at today's visit.   Plan:   1.Contraception: Depo-Provera injections - renewed for 1 year  2. Infant feeding:  patient is currently feeding with formula.  3. Mood: EPDS is high risk.  Meets criteria for postpartum depression.  Discussed [facility] as link to care and availability of LCSW for counseling   1. Iron deficiency anemia, unspecified iron deficiency anemia type - Hemoglobin, venipuncture - stable improved. Ok to stop fe and use only mutlivitamin  2. Postpartum exam ** needs pap at next visit - Ambulatory referral to [facility] - Multiple Vitamins-Minerals (MULTIVITAMIN) tablet; Take 1 tablet by mouth daily.  Dispense: 100 tablet; Refill: 0  3. Supervision of high risk pregnancy, antepartum Resolved  4. Morbid obesity with BMI of 40.0-44.9, adult ([place]) Encouraged weight loss  7. Encounter for initial prescription of injectable contraceptive - medroxyPROGESTERone (DEPO-PROVERA) injection  150 mg   Patient given handout about PCP care in the community Given MVI per family planning program  Follow up in: 4 weeks  Future Appointments  Date Time Provider [place]  07/31/2019 10:50 AM Milton Ferguson, LCSW [facility] None

## 2019-07-17 NOTE — Telephone Encounter (Signed)
TC to patient who states her pharmacy told her that her Medicaid won't cover her prescription for Zoloft. Patient unsure how to contact Medicaid worker. Patient told that her medication is on the [facility] low cost medications list, and would cost her $9.00 for a 30 day supply. Patient told that if she needs a paper prescription to take to [facility] for less expensive medication, she should come to clinic to pick up a prescription. Patient also informed that RN would find a number for her to call to ask about her Medicaid coverage for medications. 2nd phone call to patient and had to leave message with DSS phone number. Patient also told in message to call us back if she has additional questions or concerns. Left number to call.Jenetta Downer, RN

## 2019-07-17 NOTE — Progress Notes (Signed)
Patient here for PP exam. SVD on 06/05/2019. Needs Hgb checked today and is using Depo for BCM. First Depo given at the hospital post-partum. Depo consent signed today and patient told next Depo due around 08/26/2019. Burman Blacksmith, RN

## 2019-07-21 ENCOUNTER — Telehealth: Payer: Self-pay | Admitting: Family Medicine

## 2019-07-21 NOTE — Telephone Encounter (Signed)
Patient calling stating she "could not reach anyone when she called her Medicaid worker." Patient requesting for provider to call her Zoloft medication in "as a generic medicine" so [employer] will cover the cost of med. Pharmacy choice updated in chart and above phone call routed to E. Sciora, CNM. Hal Morales, RN

## 2019-07-21 NOTE — Telephone Encounter (Signed)
Patient stated she wanted to speak to a [place] Nurse.

## 2019-07-21 NOTE — Telephone Encounter (Signed)
Rx was sent as a generic (sertraline) and is on the $4 list even without medicaid.

## 2019-07-22 ENCOUNTER — Other Ambulatory Visit: Payer: Self-pay | Admitting: Family Medicine

## 2019-07-22 NOTE — Telephone Encounter (Signed)
Checked with [facility] for Sertraline rec'd and is ready.  Attempted to call pt.-left message that her med is ready at [place]. [facility] Debera Lat, RN

## 2019-07-22 NOTE — Telephone Encounter (Signed)
Need meds called in

## 2019-07-31 ENCOUNTER — Ambulatory Visit: Payer: Medicaid Other | Admitting: Licensed Clinical Social Worker

## 2019-08-07 ENCOUNTER — Telehealth: Payer: Self-pay | Admitting: Oncology

## 2019-08-07 NOTE — Telephone Encounter (Signed)
LM to try and R/S NS appt-ltg °

## 2019-08-11 ENCOUNTER — Ambulatory Visit: Payer: Medicaid Other | Admitting: Licensed Clinical Social Worker

## 2019-08-13 ENCOUNTER — Ambulatory Visit: Payer: Medicaid Other | Admitting: Licensed Clinical Social Worker

## 2019-08-13 ENCOUNTER — Telehealth: Payer: Self-pay | Admitting: Licensed Clinical Social Worker

## 2019-08-13 NOTE — Telephone Encounter (Signed)
LCSw attempted to call patient to follow up regarding consent not being completed. LCSW left vm for patient encouraging her to call to reschedule once consents are signed.

## 2019-08-21 ENCOUNTER — Ambulatory Visit: Payer: Self-pay

## 2019-08-21 ENCOUNTER — Ambulatory Visit (LOCAL_COMMUNITY_HEALTH_CENTER): Payer: Medicaid Other | Admitting: Physician Assistant

## 2019-08-21 ENCOUNTER — Ambulatory Visit: Payer: Medicaid Other

## 2019-08-21 ENCOUNTER — Other Ambulatory Visit: Payer: Self-pay

## 2019-08-21 VITALS — BP 115/72 | Ht 60.0 in | Wt 230.8 lb

## 2019-08-21 DIAGNOSIS — Z3040 Encounter for surveillance of contraceptives, unspecified: Secondary | ICD-10-CM

## 2019-08-21 DIAGNOSIS — Z3009 Encounter for other general counseling and advice on contraception: Secondary | ICD-10-CM

## 2019-08-21 NOTE — Progress Notes (Addendum)
Here today for Depo. Last Depo 06/06/2019 (10.6 weeks) Stopped taking Zoloft "felt like it wasn't doing anything." Hal Morales, RN Patient requested to reschedule appt due to "needing to get home to my baby." Appt rescheduled for 08/26/2019 @ 9:20. Hal Morales, RN

## 2019-08-26 ENCOUNTER — Ambulatory Visit (LOCAL_COMMUNITY_HEALTH_CENTER): Payer: Medicaid Other | Admitting: Family Medicine

## 2019-08-26 ENCOUNTER — Other Ambulatory Visit: Payer: Self-pay

## 2019-08-26 VITALS — BP 136/84 | Ht 60.0 in | Wt 230.2 lb

## 2019-08-26 DIAGNOSIS — Z3009 Encounter for other general counseling and advice on contraception: Secondary | ICD-10-CM | POA: Diagnosis not present

## 2019-08-26 DIAGNOSIS — F53 Postpartum depression: Secondary | ICD-10-CM

## 2019-08-26 DIAGNOSIS — O99345 Other mental disorders complicating the puerperium: Secondary | ICD-10-CM | POA: Insufficient documentation

## 2019-08-26 DIAGNOSIS — Z30013 Encounter for initial prescription of injectable contraceptive: Secondary | ICD-10-CM

## 2019-08-26 DIAGNOSIS — Z3042 Encounter for surveillance of injectable contraceptive: Secondary | ICD-10-CM

## 2019-08-26 HISTORY — DX: Postpartum depression: F53.0

## 2019-08-26 MED ORDER — MEDROXYPROGESTERONE ACETATE 150 MG/ML IM SUSP
150.0000 mg | Freq: Once | INTRAMUSCULAR | Status: AC
  Administered 2019-08-26: 150 mg via INTRAMUSCULAR

## 2019-08-26 NOTE — Progress Notes (Signed)
   [place] problem visit  [place] Department  Subjective:  Cindy [name] is a 23 y.o. being seen today for   Chief Complaint  Patient presents with  . Contraception    wants Depo  . Possible Pregnancy    wants PT    HPI  Client here today for Depo Provera Delivered 05/2019 and received her Depo after delivery.  She is here for continuation of Depo.  Client states that she was talking to Milton Ferguson during her pregnancy and was given Zoloft @ [facility] maternity clinic for PP depression.  States that she took medication x 1 week and didn't feel better so she stopped.  She is feeling somewhat depressed and overwhelmed d/t her uncertain housing.  States she, mother and baby are to be evicted 09/16/2019 d/t behind on rent.  She denies thoughts of harming herself or baby.  She plans to contact [place] about counseling.   Does the patient have a current or past history of drug use? No   No components found for: HCV]   Health Maintenance Due  Topic Date Due  . TETANUS/TDAP  06/01/2017  . INFLUENZA VACCINE  05/17/2019  . PAP-Cervical Cytology Screening  06/02/2019  . PAP SMEAR-Modifier  06/02/2019    ROS  The following portions of the patient's history were reviewed and updated as appropriate: allergies, current medications, past family history, past medical history, past social history, past surgical history and problem list. Problem list updated.   See flowsheet for other program required questions.  Objective:   Vitals:   08/26/19 1055  BP: 136/84  Weight: 230 lb 3.2 oz (104.4 kg)  Height: 5' (1.524 m)    Physical Exam not indicated    Assessment and Plan:  Cindy [name] is a 23 y.o. female presenting to the [facility] Department for a Women's Health problem visit  1. Encounter for surveillance of injectable contraceptive  - medroxyPROGESTERone (DEPO-PROVERA) injection 150 mg x 2 RF  2. Post Partum Depression Co. Client  to restart Zoloft and that med may take several weeks to help with depression. Encouraged her to call Milton Ferguson.  Referral made for Milton Ferguson today.  No follow-ups on file.  No future appointments.  Hassell Done, FNP

## 2019-08-26 NOTE — Progress Notes (Signed)
Consult with provider, Neville Route. Patient denies sex within 2 weeks of baby's birth and 1st depo. Per provider, ok to given depo today, 11 4/7 since last Depo. Consent signed on 08/21/2019.Marland KitchenJenetta Downer, RN

## 2019-08-26 NOTE — Progress Notes (Addendum)
Patient here for PT and Depo.  Last Depo  In hospital after baby's birth 06/06/2019.Marland KitchenJenetta Downer, RN

## 2019-11-27 ENCOUNTER — Ambulatory Visit: Payer: Medicaid Other

## 2020-01-14 ENCOUNTER — Telehealth: Payer: Self-pay | Admitting: Family Medicine

## 2020-01-14 NOTE — Telephone Encounter (Signed)
Patient would like to speak to a nurse regarding her late period.

## 2020-01-14 NOTE — Telephone Encounter (Signed)
Note continued:  use condoms between now and appt scheduled for next week, but best to abstain.

## 2020-01-14 NOTE — Telephone Encounter (Signed)
Phone call to pt. Pt states she is late for her period and has not taken a home pregancy test. Pt states her last depo was Oct or Nov 2020. Pt counseled there are several things that can cause her not to have her period including pregnancy and depo. Depo is no longer protecting her against pregnancy, depo is past due. Pt is interested in restarting depo  Pt counseled to use back-up method like condoms with any sex from now until her appt on

## 2020-01-22 ENCOUNTER — Ambulatory Visit: Payer: Self-pay

## 2020-04-07 ENCOUNTER — Emergency Department
Admission: EM | Admit: 2020-04-07 | Discharge: 2020-04-08 | Disposition: A | Discharge status: Home / Self Care | Payer: Medicaid Other | Attending: Emergency Medicine | Admitting: Emergency Medicine

## 2020-04-07 ENCOUNTER — Telehealth: Payer: Self-pay | Admitting: Family Medicine

## 2020-04-07 ENCOUNTER — Other Ambulatory Visit: Payer: Self-pay

## 2020-04-07 DIAGNOSIS — Z5321 Procedure and treatment not carried out due to patient leaving prior to being seen by health care provider: Secondary | ICD-10-CM | POA: Diagnosis not present

## 2020-04-07 DIAGNOSIS — R1030 Lower abdominal pain, unspecified: Secondary | ICD-10-CM | POA: Diagnosis present

## 2020-04-07 DIAGNOSIS — R110 Nausea: Secondary | ICD-10-CM | POA: Insufficient documentation

## 2020-04-07 LAB — COMPREHENSIVE METABOLIC PANEL
ALT: 15 U/L (ref 0–44)
AST: 13 U/L — ABNORMAL LOW (ref 15–41)
Albumin: 3.9 g/dL (ref 3.5–5.0)
Alkaline Phosphatase: 58 U/L (ref 38–126)
Anion gap: 4 — ABNORMAL LOW (ref 5–15)
BUN: 7 mg/dL (ref 6–20)
CO2: 31 mmol/L (ref 22–32)
Calcium: 8.9 mg/dL (ref 8.9–10.3)
Chloride: 105 mmol/L (ref 98–111)
Creatinine, Ser: 0.72 mg/dL (ref 0.44–1.00)
GFR calc Af Amer: >60 mL/min (ref >60)
GFR calc non Af Amer: >60 mL/min (ref >60)
Glucose, Bld: 88 mg/dL (ref 70–99)
Potassium: 3.2 mmol/L — ABNORMAL LOW (ref 3.5–5.1)
Sodium: 140 mmol/L (ref 135–145)
Total Bilirubin: 0.6 mg/dL (ref 0.3–1.2)
Total Protein: 7.6 g/dL (ref 6.5–8.1)

## 2020-04-07 LAB — CBC
HCT: 35 % — ABNORMAL LOW (ref 36.0–46.0)
Hemoglobin: 11.8 g/dL — ABNORMAL LOW (ref 12.0–15.0)
MCH: 22.6 pg — ABNORMAL LOW (ref 26.0–34.0)
MCHC: 33.7 g/dL (ref 30.0–36.0)
MCV: 66.9 fL — ABNORMAL LOW (ref 80.0–100.0)
Platelets: 389 10*3/uL (ref 150–400)
RBC: 5.23 MIL/uL — ABNORMAL HIGH (ref 3.87–5.11)
RDW: 15.9 % — ABNORMAL HIGH (ref 11.5–15.5)
WBC: 9.3 10*3/uL (ref 4.0–10.5)
nRBC: 0 % (ref 0.0–0.2)

## 2020-04-07 LAB — POCT PREGNANCY, URINE: Preg Test, Ur: NEGATIVE

## 2020-04-07 LAB — URINALYSIS, COMPLETE (UACMP) WITH MICROSCOPIC
Bacteria, UA: NONE SEEN
Bilirubin Urine: NEGATIVE
Glucose, UA: NEGATIVE mg/dL
Ketones, ur: NEGATIVE mg/dL
Leukocytes,Ua: NEGATIVE
Nitrite: NEGATIVE
Protein, ur: 30 mg/dL — AB
RBC / HPF: >50 RBC/hpf — ABNORMAL HIGH (ref 0–5)
Specific Gravity, Urine: 1.026 (ref 1.005–1.030)
pH: 6 (ref 5.0–8.0)

## 2020-04-07 LAB — LIPASE, BLOOD: Lipase: 22 U/L (ref 11–51)

## 2020-04-07 MED ORDER — SODIUM CHLORIDE 0.9% FLUSH: 3.0000 mL | Freq: Once | INTRAVENOUS | Status: DC

## 2020-04-07 NOTE — Telephone Encounter (Signed)
Patient would like to speak to a nurse regarding her period, having abdominal pain and feeling nausea

## 2020-04-07 NOTE — Telephone Encounter (Signed)
This RN returned pt's call at phone # provided. Pt reports that she's been having severe 10/10 sharp pain in her lower abdomen since a few days before her period started that has been constant pain. Pt reports that the pain is in her lower abdomen but if she lays down or turns to one side it moves. Pt reports her period has been going on for 10 days now and has been a normal flow and flow is slowing down now but still having pain. Pt reports she has tried Tylenol and Ibuprofen without relief. Pt reports having some nausea but no other symptoms. This RN counseled pt I will consult with provider and give her a call back. Consulted with Antoine Primas, PA regarding above conversation with pt and per Antoine Primas, PA pt should be evaluated at ER or Urgent care based on pt's symptoms. This RN called pt back and counseled pt per Antoine Primas, PA and pt states understanding. Pt with no further questions at this time.

## 2020-04-07 NOTE — ED Triage Notes (Signed)
Pt comes with c/o lower abdominal pain that start a week ago. Pt states some nausea. Pt denies any vomiting.  Pt states limited intake.

## 2020-04-08 ENCOUNTER — Telehealth: Payer: Self-pay | Admitting: Family Medicine

## 2020-04-08 NOTE — ED Notes (Signed)
No answer when called several times from lobby 

## 2020-04-08 NOTE — Telephone Encounter (Signed)
Consulted by RN re:  patient concern of lower abdominal pain.  Reviewed RN note and agree that it reflects our discussion and recommendations for the patient.

## 2020-04-08 NOTE — Telephone Encounter (Signed)
This RN returned pt's call at phone # provided. Pt reports that she went to the ED yesterday and had a question about a test result from yesterday (04/07/2020) that was showing up in her MyChart account. Counseled pt that she should call the ED and let them know that she has a question about her TR since the test was completed there and pt states understanding. Pt with no further questions or concerns at this time.

## 2020-04-08 NOTE — Telephone Encounter (Signed)
Please call me I have a question about something I seen on my records

## 2020-08-24 ENCOUNTER — Encounter (HOSPITAL_COMMUNITY): Payer: Self-pay | Admitting: Emergency Medicine

## 2020-08-24 ENCOUNTER — Inpatient Hospital Stay (HOSPITAL_COMMUNITY): Payer: Medicaid Other

## 2020-08-24 ENCOUNTER — Inpatient Hospital Stay (HOSPITAL_COMMUNITY)
Admission: EM | Admit: 2020-08-24 | Discharge: 2020-08-24 | Disposition: A | Discharge status: Home / Self Care | Payer: Medicaid Other | Attending: Obstetrics and Gynecology | Admitting: Obstetrics and Gynecology

## 2020-08-24 ENCOUNTER — Other Ambulatory Visit: Payer: Self-pay

## 2020-08-24 DIAGNOSIS — O26899 Other specified pregnancy related conditions, unspecified trimester: Secondary | ICD-10-CM | POA: Diagnosis not present

## 2020-08-24 DIAGNOSIS — R102 Pelvic and perineal pain: Secondary | ICD-10-CM | POA: Insufficient documentation

## 2020-08-24 DIAGNOSIS — O3680X0 Pregnancy with inconclusive fetal viability, not applicable or unspecified: Secondary | ICD-10-CM | POA: Diagnosis not present

## 2020-08-24 DIAGNOSIS — R109 Unspecified abdominal pain: Secondary | ICD-10-CM

## 2020-08-24 DIAGNOSIS — O4690 Antepartum hemorrhage, unspecified, unspecified trimester: Secondary | ICD-10-CM

## 2020-08-24 DIAGNOSIS — O209 Hemorrhage in early pregnancy, unspecified: Secondary | ICD-10-CM

## 2020-08-24 DIAGNOSIS — O26891 Other specified pregnancy related conditions, first trimester: Secondary | ICD-10-CM

## 2020-08-24 DIAGNOSIS — Z3A00 Weeks of gestation of pregnancy not specified: Secondary | ICD-10-CM

## 2020-08-24 LAB — URINALYSIS, ROUTINE W REFLEX MICROSCOPIC
Leukocytes,Ua: NEGATIVE
Nitrite: NEGATIVE

## 2020-08-24 LAB — URINALYSIS, MICROSCOPIC (REFLEX)

## 2020-08-24 LAB — CBC WITH DIFFERENTIAL/PLATELET
Abs Immature Granulocytes: 0.03 10*3/uL (ref 0.00–0.07)
Basophils Absolute: 0 10*3/uL (ref 0.0–0.1)
Basophils Relative: 1 %
Eosinophils Absolute: 0.2 10*3/uL (ref 0.0–0.5)
Eosinophils Relative: 2 %
HCT: 36.1 % (ref 36.0–46.0)
Hemoglobin: 11.9 g/dL — ABNORMAL LOW (ref 12.0–15.0)
Immature Granulocytes: 0 %
Lymphocytes Relative: 35 %
Lymphs Abs: 2.9 10*3/uL (ref 0.7–4.0)
MCH: 22.4 pg — ABNORMAL LOW (ref 26.0–34.0)
MCHC: 33 g/dL (ref 30.0–36.0)
MCV: 67.9 fL — ABNORMAL LOW (ref 80.0–100.0)
Monocytes Absolute: 0.6 10*3/uL (ref 0.1–1.0)
Monocytes Relative: 7 %
Neutro Abs: 4.7 10*3/uL (ref 1.7–7.7)
Neutrophils Relative %: 55 %
Platelets: 369 10*3/uL (ref 150–400)
RBC: 5.32 MIL/uL — ABNORMAL HIGH (ref 3.87–5.11)
RDW: 17.1 % — ABNORMAL HIGH (ref 11.5–15.5)
WBC: 8.4 10*3/uL (ref 4.0–10.5)
nRBC: 0 % (ref 0.0–0.2)

## 2020-08-24 LAB — COMPREHENSIVE METABOLIC PANEL
ALT: 19 U/L (ref 0–44)
AST: 17 U/L (ref 15–41)
Albumin: 4 g/dL (ref 3.5–5.0)
Alkaline Phosphatase: 57 U/L (ref 38–126)
Anion gap: 9 (ref 5–15)
BUN: <5 mg/dL — ABNORMAL LOW (ref 6–20)
CO2: 26 mmol/L (ref 22–32)
Calcium: 9.2 mg/dL (ref 8.9–10.3)
Chloride: 105 mmol/L (ref 98–111)
Creatinine, Ser: 0.62 mg/dL (ref 0.44–1.00)
GFR, Estimated: >60 mL/min (ref >60)
Glucose, Bld: 87 mg/dL (ref 70–99)
Potassium: 3.1 mmol/L — ABNORMAL LOW (ref 3.5–5.1)
Sodium: 140 mmol/L (ref 135–145)
Total Bilirubin: 0.8 mg/dL (ref 0.3–1.2)
Total Protein: 7.4 g/dL (ref 6.5–8.1)

## 2020-08-24 LAB — WET PREP, GENITAL
Clue Cells Wet Prep HPF POC: NONE SEEN
Sperm: NONE SEEN
Trich, Wet Prep: NONE SEEN
Yeast Wet Prep HPF POC: NONE SEEN

## 2020-08-24 LAB — HCG, QUANTITATIVE, PREGNANCY: hCG, Beta Chain, Quant, S: 312 m[IU]/mL — ABNORMAL HIGH (ref <5)

## 2020-08-24 LAB — I-STAT BETA HCG BLOOD, ED (MC, WL, AP ONLY): I-stat hCG, quantitative: 239.4 m[IU]/mL — ABNORMAL HIGH (ref <5)

## 2020-08-24 LAB — LIPASE, BLOOD: Lipase: 20 U/L (ref 11–51)

## 2020-08-24 NOTE — MAU Provider Note (Signed)
History     CSN: [id number]  Arrival date and time: 08/24/20 1208   First Provider Initiated Contact with Patient 08/24/20 1422      Chief Complaint  Patient presents with  . Abdominal Pain  . Back Pain  . Vaginal Bleeding   HPI Cindy [name] is a 24 y.o. G3P2002 at Unknown who presents with vaginal bleeding & abdominal cramping. Has been having irregular periods. Had first positive pregnancy test.  Patient has had vaginal bleeding for the last week. Bleeding had stopped but restarted 2 days ago. States bleeding is as heavy as a period for her & she has been passing some small clots. Has been having pelvic cramping since last night; feels like period cramps.  Denies fever/chills, dysuria, abnormal discharge.   Location: abdomen Quality: cramping Severity: 3/10 on pain scale Duration: 1 day Timing: intermittent Modifying factors: none Associated signs and symptoms: vaginal bleeding    OB History    Gravida  3   Para  2   Term  2   Preterm      AB      Living  2     SAB      TAB      Ectopic      Multiple  0   Live Births  2           Past Medical History:  Diagnosis Date  . Anemia   . Depression    is past hx but not active now per pt  . Morbidly obese ([place])    notes from health dept as hx    Past Surgical History:  Procedure Laterality Date  . COLONOSCOPY WITH PROPOFOL N/A 06/15/2017   Procedure: COLONOSCOPY WITH PROPOFOL;  Surgeon: [name], MD;  [facility] ENDOSCOPY;  Service: Gastroenterology;  Laterality: N/A;  . ESOPHAGOGASTRODUODENOSCOPY (EGD) WITH PROPOFOL N/A 06/15/2017   Procedure: ESOPHAGOGASTRODUODENOSCOPY (EGD) WITH PROPOFOL;  Surgeon: [name], MD;  Location: [facility] ENDOSCOPY;  Service: Gastroenterology;  Laterality: N/A;  . TONSILLECTOMY      Family History  Problem Relation Age of Onset  . Thyroid disease Mother   . Hypertension Mother   . Sickle cell trait Mother   . Sickle cell anemia Sister   . Hypertension  Maternal Grandmother     Social History   Tobacco Use  . Smoking status: Never Smoker  . Smokeless tobacco: Never Used  Vaping Use  . Vaping Use: Never used  Substance Use Topics  . Alcohol use: No    Comment: former  . Drug use: Not Currently    Types: Marijuana    Comment: last used in sept    Allergies: No Known Allergies  Medications Prior to Admission  Medication Sig Dispense Refill Last Dose  . acetaminophen (TYLENOL) 325 MG tablet Take 2 tablets (650 mg total) by mouth every 6 (six) hours as needed for mild pain, moderate pain or headache (for pain scale < 4). (Patient not taking: Reported on 07/17/2019) 100 tablet 0   . ibuprofen (ADVIL) 600 MG tablet Take 1 tablet (600 mg total) by mouth every 6 (six) hours as needed for mild pain, moderate pain or cramping. (Patient not taking: Reported on 07/17/2019) 30 tablet 1   . Multiple Vitamins-Minerals (MULTIVITAMIN) tablet Take 1 tablet by mouth daily. 100 tablet 0   . Prenatal Vit-Fe Fumarate-FA (PRENATAL VITAMIN PO) Take 1 tablet by mouth daily.    at not taking  . sertraline (ZOLOFT) 25 MG tablet Take 1 tablet (  25 mg total) by mouth daily. Take 1 tablet for 1 week and then increase to 2 tablets (50mg ) until your next visit. (Patient not taking: Reported on 08/21/2019) 50 tablet 4     Review of Systems  Constitutional: Negative.   Gastrointestinal: Positive for abdominal pain. Negative for diarrhea, nausea and vomiting.  Genitourinary: Positive for vaginal bleeding. Negative for dyspareunia, dysuria and vaginal discharge.   Physical Exam   Blood pressure 129/68, pulse 98, temperature 98.9 F (37.2 C), temperature source Oral, resp. rate 16, height 5' (1.524 m), weight 108.1 kg, SpO2 100 %, not currently breastfeeding.  Physical Exam Vitals and nursing note reviewed. Exam conducted with a chaperone present.  Constitutional:      General: She is not in acute distress.    Appearance: She is well-developed.  HENT:     Head:  Normocephalic and atraumatic.  Pulmonary:     Effort: Pulmonary effort is normal. No respiratory distress.  Abdominal:     Tenderness: There is no abdominal tenderness.  Genitourinary:    General: Normal vulva.     Exam position: Lithotomy position.     Cervix: Cervical bleeding present. No cervical motion tenderness, lesion or erythema.     Comments: Small amount of dark red blood coming from os. Small 1-2 cm clot removed from vagina. Cervix visually closed.  Neurological:     Mental Status: She is alert.     MAU Course  Procedures Results for orders placed or performed during the hospital encounter of 08/24/20 (from the past 24 hour(s))  I-Stat beta hCG blood, ED     Status: Abnormal   Collection Time: 08/24/20 12:46 PM  Result Value Ref Range   I-stat hCG, quantitative 239.4 (H) <5 mIU/mL   Comment 3          Urinalysis, Routine w reflex microscopic     Status: Abnormal   Collection Time: 08/24/20 12:48 PM  Result Value Ref Range   Color, Urine RED (A) YELLOW   APPearance TURBID (A) CLEAR   Specific Gravity, Urine  1.005 - 1.030    TEST NOT REPORTED DUE TO COLOR INTERFERENCE OF URINE PIGMENT   pH  5.0 - 8.0    TEST NOT REPORTED DUE TO COLOR INTERFERENCE OF URINE PIGMENT   Glucose, UA (A) NEGATIVE mg/dL    TEST NOT REPORTED DUE TO COLOR INTERFERENCE OF URINE PIGMENT   Hgb urine dipstick (A) NEGATIVE    TEST NOT REPORTED DUE TO COLOR INTERFERENCE OF URINE PIGMENT   Bilirubin Urine (A) NEGATIVE    TEST NOT REPORTED DUE TO COLOR INTERFERENCE OF URINE PIGMENT   Ketones, ur (A) NEGATIVE mg/dL    TEST NOT REPORTED DUE TO COLOR INTERFERENCE OF URINE PIGMENT   Protein, ur (A) NEGATIVE mg/dL    TEST NOT REPORTED DUE TO COLOR INTERFERENCE OF URINE PIGMENT   Nitrite NEGATIVE NEGATIVE   Leukocytes,Ua NEGATIVE NEGATIVE  Urinalysis, Microscopic (reflex)     Status: Abnormal   Collection Time: 08/24/20 12:48 PM  Result Value Ref Range   RBC / HPF 21-50 0 - 5 RBC/hpf   WBC, UA FIELD  OBSCURED BY RBC'S 0 - 5 WBC/hpf   Bacteria, UA FIELD OBSCURED BY RBC'S (A) NONE SEEN   Squamous Epithelial / LPF FIELD OBSCURED BY RBC'S 0 - 5  CBC with Differential/Platelet     Status: Abnormal   Collection Time: 08/24/20 12:57 PM  Result Value Ref Range   WBC 8.4 4.0 - 10.5 K/uL  RBC 5.32 (H) 3.87 - 5.11 MIL/uL   Hemoglobin 11.9 (L) 12.0 - 15.0 g/dL   HCT 36.1 36 - 46 %   MCV 67.9 (L) 80.0 - 100.0 fL   MCH 22.4 (L) 26.0 - 34.0 pg   MCHC 33.0 30.0 - 36.0 g/dL   RDW 17.1 (H) 11.5 - 15.5 %   Platelets 369 150 - 400 K/uL   nRBC 0.0 0.0 - 0.2 %   Neutrophils Relative % 55 %   Neutro Abs 4.7 1.7 - 7.7 K/uL   Lymphocytes Relative 35 %   Lymphs Abs 2.9 0.7 - 4.0 K/uL   Monocytes Relative 7 %   Monocytes Absolute 0.6 0.1 - 1.0 K/uL   Eosinophils Relative 2 %   Eosinophils Absolute 0.2 0.0 - 0.5 K/uL   Basophils Relative 1 %   Basophils Absolute 0.0 0.0 - 0.1 K/uL   Immature Granulocytes 0 %   Abs Immature Granulocytes 0.03 0.00 - 0.07 K/uL  hCG, quantitative, pregnancy     Status: Abnormal   Collection Time: 08/24/20  3:04 PM  Result Value Ref Range   hCG, Beta Chain, Quant, S 312 (H) <5 mIU/mL  Wet prep, genital     Status: Abnormal   Collection Time: 08/24/20  3:04 PM   Specimen: Cervix  Result Value Ref Range   Yeast Wet Prep HPF POC NONE SEEN NONE SEEN   Trich, Wet Prep NONE SEEN NONE SEEN   Clue Cells Wet Prep HPF POC NONE SEEN NONE SEEN   WBC, Wet Prep HPF POC FEW (A) NONE SEEN   Sperm NONE SEEN    US OB LESS THAN 14 WEEKS WITH OB TRANSVAGINAL  Result Date: 08/24/2020 CLINICAL DATA:  Vaginal bleeding, quantitative beta HCG 239.4 EXAM: OBSTETRIC <14 WK [place] AND TRANSVAGINAL OB US TECHNIQUE: Both transabdominal and transvaginal ultrasound examinations were performed for complete evaluation of the gestation as well as the maternal uterus, adnexal regions, and pelvic cul-de-sac. Transvaginal technique was performed to assess early pregnancy. COMPARISON:  None. FINDINGS:  Intrauterine gestational sac: None Yolk sac:  Not Visualized. Embryo:  Not Visualized. Maternal uterus/adnexae: Left ovary measures 3.7 x 2.9 x 3.4 cm and the right ovary measures 2.5 x 1.8 x 1.8 cm. Possible corpus luteum cyst within the left ovary measuring 1.9 cm. The uterus is unremarkable. Endometrium measures 7 mm. There is trace free fluid in the cul-de-sac. IMPRESSION: 1. No evidence of intrauterine pregnancy at this time, likely due to the early gestational age given low beta HCG value. Serial beta HCG measurements and follow-up ultrasound may be useful to document live intrauterine pregnancy. 2. Possible corpus luteum cyst left ovary. 3. Trace pelvic free fluid. Electronically Signed   By: Randa Ngo M.D.   On: 08/24/2020 15:41    MDM +UPT UA, wet prep, GC/chlamydia, CBC, ABO/Rh, quant hCG, and [place] today to rule out ectopic pregnancy which can be life threatening.   HCG today is 312 & ultrasound shows no IUP or adnexal masses.  This abdominal pain & vaginal bleeding could represent a normal pregnancy, spontaneous abortion, or even an ectopic pregnancy which can be life-threatening. Cultures were obtained to rule out pelvic infection.   RH positive  Offered pain medication prior to discharge, patient declines.    Assessment and Plan   1. Pregnancy of unknown anatomic location   2. Vaginal bleeding in pregnancy, first trimester   3. Abdominal pain during pregnancy in first trimester    -Scheduled for repeat HCG at [facility]  on Friday -GC/CT pending -reviewed reasons to return to [place] 08/24/2020, 4:23 PM

## 2020-08-24 NOTE — MAU Note (Signed)
Cindy [name] is a 24 y.o. at Unknown here in MAU reporting: thought last week that she was on her period. Last night started having bad pelvic pain. Also now having back pain. States bleeding restarted 2 days ago, states similar to a period and is passing clots. Is changing a pad 2 times per day.  LMP: unknown  Onset of complaint: last night  Pain score: abdomen 8/10, back 8/10  Vitals:   08/24/20 1212 08/24/20 1352  BP: 138/83 (!) 141/75  Pulse: 95 95  Resp: 16 16  Temp: 99.5 F (37.5 C) 98.9 F (37.2 C)  SpO2: 100% 100%     Lab orders placed from triage: none

## 2020-08-24 NOTE — ED Provider Notes (Signed)
MSE was initiated and I personally evaluated the patient and placed orders (if any) at  12:47 PM on August 24, 2020   Patient is a 24 year old female presented today with abdominal pain which he states is been ongoing intermittently for the past 2 weeks.  She states that it has gotten worse over the past few days and states that it is 10/10, achy, left lower quadrant.  She states that she has been having vaginal bleeding intermittently for the past 2 weeks as well worse today. She states she is not on any birth control and is sexually active.  She states that she has a history of irregular periods and states that her periods are often irregular and long lasting life that is however she states she has never had pain like this before.  She states that sometimes when the pain is at its worse it causes some nausea however she denies any vomiting, vaginal discharge irritation or dyspareunia.  She denies any changes in her bowel movements such as diarrhea or constipation.   CONSTITUTIONAL:  well-appearing, appears uncomfortable but in no acute distress.  Able to answer questions appropriately follow commands. NEURO:  Alert and oriented x 3, no focal deficits EYES:  pupils equal and reactive ENT/NECK:  trachea midline, no JVD CARDIO:  reg rate, reg rhythm, well-perfused PULM:  None labored breathing GI/GU:  Abdomen non-distended, tenderness to palpation left lower quadrant with no guarding or rebound. MSK/SPINE:  No gross deformities, no edema SKIN:  no rash obvious, atraumatic, no ecchymosis  PSYCH:  Appropriate speech and behavior  I-STAT hCG collected and abdominal labs ordered as well as urinalysis of the patient is not having any urinary symptoms.  Primary concern is for ectopic pregnancy versus miscarriage versus menstrual cycle irregularities.  May also be having menstrual cycle + other cause of abdominal pain unrelated to vaginal bleeding such as diverticulitis, torsion although these are  significantly less likely. She has no known history of ovarian cysts.   1:16 PM -- Pregnancy +. Called and talked to MAU provider who will accept patient to MAU.     The patient appears stable so that the remainder of the MSE may be completed by another provider.   Pati Gallo [place], [place] 08/24/20 1317    Lajean Saver, MD 08/24/20 1420

## 2020-08-24 NOTE — MAU Provider Note (Signed)
History     CSN: [id number]  Arrival date and time: 08/24/20 1208   First Provider Initiated Contact with Patient 08/24/20 1422      Chief Complaint  Patient presents with   Abdominal Pain   Back Pain   Vaginal Bleeding   HPI  Patient is a [id number] presented to the emergency room with vaginal bleeding. Patient had severe abdominal pain with positive pregnancy test in the ED, so patient was brought to MAU. Patient reports she has been experiencing vaginal bleeding for about 1 week with abdominal pain. Patient reports irregular periods and orignally attributed her symptoms to period cramping. Patient notes that the pain significantly worsened to day and experienced numbness down her left leg today, so she presented to the emergency room. Patient notes the pain is mainly in the left lower quadrant and radiates to her lower back. Patient states the pain has been constant since in started. Patient reports the pain is 8.5/10. Patient has not taken any medication for the pain. Patient denies chest pain, shortness of breath, fever, chills, flank pain, dysuria, or urinary frequency.  OB History    Gravida  3   Para  2   Term  2   Preterm      AB      Living  2     SAB      TAB      Ectopic      Multiple  0   Live Births  2           Past Medical History:  Diagnosis Date   Anemia    Depression    is past hx but not active now per pt   Morbidly obese ([place])    notes from health dept as hx    Past Surgical History:  Procedure Laterality Date   COLONOSCOPY WITH PROPOFOL N/A 06/15/2017   Procedure: COLONOSCOPY WITH PROPOFOL;  Surgeon: [name], MD;  Location: [facility] ENDOSCOPY;  Service: Gastroenterology;  Laterality: N/A;   ESOPHAGOGASTRODUODENOSCOPY (EGD) WITH PROPOFOL N/A 06/15/2017   Procedure: ESOPHAGOGASTRODUODENOSCOPY (EGD) WITH PROPOFOL;  Surgeon: [name], MD;  Location: [facility] ENDOSCOPY;  Service: Gastroenterology;  Laterality: N/A;   TONSILLECTOMY       Family History  Problem Relation Age of Onset   Thyroid disease Mother    Hypertension Mother    Sickle cell trait Mother    Sickle cell anemia Sister    Hypertension Maternal Grandmother     Social History   Tobacco Use   Smoking status: Never Smoker   Smokeless tobacco: Never Used  Vaping Use   Vaping Use: Never used  Substance Use Topics   Alcohol use: No    Comment: former   Drug use: Not Currently    Types: Marijuana    Comment: last used in sept    Allergies: No Known Allergies  Medications Prior to Admission  Medication Sig Dispense Refill Last Dose   acetaminophen (TYLENOL) 325 MG tablet Take 2 tablets (650 mg total) by mouth every 6 (six) hours as needed for mild pain, moderate pain or headache (for pain scale < 4). (Patient not taking: Reported on 07/17/2019) 100 tablet 0    ibuprofen (ADVIL) 600 MG tablet Take 1 tablet (600 mg total) by mouth every 6 (six) hours as needed for mild pain, moderate pain or cramping. (Patient not taking: Reported on 07/17/2019) 30 tablet 1    Multiple Vitamins-Minerals (MULTIVITAMIN) tablet Take 1 tablet by mouth daily. 100 tablet 0  Prenatal Vit-Fe Fumarate-FA (PRENATAL VITAMIN PO) Take 1 tablet by mouth daily.    at not taking   sertraline (ZOLOFT) 25 MG tablet Take 1 tablet (25 mg total) by mouth daily. Take 1 tablet for 1 week and then increase to 2 tablets (50mg ) until your next visit. (Patient not taking: Reported on 08/21/2019) 50 tablet 4     Review of Systems  Constitutional: Negative for chills and fever.  Respiratory: Negative for shortness of breath.   Cardiovascular: Negative for chest pain.  Gastrointestinal: Positive for abdominal pain. Negative for diarrhea, nausea and vomiting.  Genitourinary: Positive for pelvic pain, vaginal bleeding and vaginal pain. Negative for dysuria, flank pain and frequency.  Musculoskeletal: Positive for back pain.  Neurological: Negative for dizziness and headaches.    Physical Exam   Blood pressure 129/68, pulse 98, temperature 98.9 F (37.2 C), temperature source Oral, resp. rate 16, height 5' (1.524 m), weight 108.1 kg, SpO2 100 %, not currently breastfeeding.  Physical Exam Exam conducted with a chaperone present.  Constitutional:      Appearance: She is well-developed.  HENT:     Head: Normocephalic and atraumatic.  Pulmonary:     Effort: Pulmonary effort is normal.     Breath sounds: Normal breath sounds.  Abdominal:     General: Abdomen is flat. Bowel sounds are normal. There is no distension.     Palpations: Abdomen is soft. There is no mass.     Tenderness: There is abdominal tenderness (LLQ ). There is no right CVA tenderness, left CVA tenderness or guarding.  Genitourinary:    General: Normal vulva.     Cervix: Cervical bleeding present.  Musculoskeletal:     Right lower leg: No edema.     Left lower leg: No edema.  Skin:    General: Skin is warm and dry.     Findings: No rash.  Neurological:     General: No focal deficit present.     Mental Status: She is alert and oriented to person, place, and time.     MAU Course  Procedures Results for orders placed or performed during the hospital encounter of 08/24/20 (from the past 24 hour(s))  I-Stat beta hCG blood, ED     Status: Abnormal   Collection Time: 08/24/20 12:46 PM  Result Value Ref Range   I-stat hCG, quantitative 239.4 (H) <5 mIU/mL   Comment 3          Urinalysis, Routine w reflex microscopic     Status: Abnormal   Collection Time: 08/24/20 12:48 PM  Result Value Ref Range   Color, Urine RED (A) YELLOW   APPearance TURBID (A) CLEAR   Specific Gravity, Urine  1.005 - 1.030    TEST NOT REPORTED DUE TO COLOR INTERFERENCE OF URINE PIGMENT   pH  5.0 - 8.0    TEST NOT REPORTED DUE TO COLOR INTERFERENCE OF URINE PIGMENT   Glucose, UA (A) NEGATIVE mg/dL    TEST NOT REPORTED DUE TO COLOR INTERFERENCE OF URINE PIGMENT   Hgb urine dipstick (A) NEGATIVE    TEST NOT  REPORTED DUE TO COLOR INTERFERENCE OF URINE PIGMENT   Bilirubin Urine (A) NEGATIVE    TEST NOT REPORTED DUE TO COLOR INTERFERENCE OF URINE PIGMENT   Ketones, ur (A) NEGATIVE mg/dL    TEST NOT REPORTED DUE TO COLOR INTERFERENCE OF URINE PIGMENT   Protein, ur (A) NEGATIVE mg/dL    TEST NOT REPORTED DUE TO COLOR INTERFERENCE OF URINE PIGMENT  Nitrite NEGATIVE NEGATIVE   Leukocytes,Ua NEGATIVE NEGATIVE  Urinalysis, Microscopic (reflex)     Status: Abnormal   Collection Time: 08/24/20 12:48 PM  Result Value Ref Range   RBC / HPF 21-50 0 - 5 RBC/hpf   WBC, UA FIELD OBSCURED BY RBC'S 0 - 5 WBC/hpf   Bacteria, UA FIELD OBSCURED BY RBC'S (A) NONE SEEN   Squamous Epithelial / LPF FIELD OBSCURED BY RBC'S 0 - 5  CBC with Differential/Platelet     Status: Abnormal   Collection Time: 08/24/20 12:57 PM  Result Value Ref Range   WBC 8.4 4.0 - 10.5 K/uL   RBC 5.32 (H) 3.87 - 5.11 MIL/uL   Hemoglobin 11.9 (L) 12.0 - 15.0 g/dL   HCT 36.1 36 - 46 %   MCV 67.9 (L) 80.0 - 100.0 fL   MCH 22.4 (L) 26.0 - 34.0 pg   MCHC 33.0 30.0 - 36.0 g/dL   RDW 17.1 (H) 11.5 - 15.5 %   Platelets 369 150 - 400 K/uL   nRBC 0.0 0.0 - 0.2 %   Neutrophils Relative % 55 %   Neutro Abs 4.7 1.7 - 7.7 K/uL   Lymphocytes Relative 35 %   Lymphs Abs 2.9 0.7 - 4.0 K/uL   Monocytes Relative 7 %   Monocytes Absolute 0.6 0.1 - 1.0 K/uL   Eosinophils Relative 2 %   Eosinophils Absolute 0.2 0.0 - 0.5 K/uL   Basophils Relative 1 %   Basophils Absolute 0.0 0.0 - 0.1 K/uL   Immature Granulocytes 0 %   Abs Immature Granulocytes 0.03 0.00 - 0.07 K/uL  hCG, quantitative, pregnancy     Status: Abnormal   Collection Time: 08/24/20  3:04 PM  Result Value Ref Range   hCG, Beta Chain, Quant, S 312 (H) <5 mIU/mL  Wet prep, genital     Status: Abnormal   Collection Time: 08/24/20  3:04 PM   Specimen: Cervix  Result Value Ref Range   Yeast Wet Prep HPF POC NONE SEEN NONE SEEN   Trich, Wet Prep NONE SEEN NONE SEEN   Clue Cells Wet Prep  HPF POC NONE SEEN NONE SEEN   WBC, Wet Prep HPF POC FEW (A) NONE SEEN   Sperm NONE SEEN    US OB LESS THAN 14 WEEKS WITH OB TRANSVAGINAL  Result Date: 08/24/2020 CLINICAL DATA:  Vaginal bleeding, quantitative beta HCG 239.4 EXAM: OBSTETRIC <14 WK [place] AND TRANSVAGINAL OB US TECHNIQUE: Both transabdominal and transvaginal ultrasound examinations were performed for complete evaluation of the gestation as well as the maternal uterus, adnexal regions, and pelvic cul-de-sac. Transvaginal technique was performed to assess early pregnancy. COMPARISON:  None. FINDINGS: Intrauterine gestational sac: None Yolk sac:  Not Visualized. Embryo:  Not Visualized. Maternal uterus/adnexae: Left ovary measures 3.7 x 2.9 x 3.4 cm and the right ovary measures 2.5 x 1.8 x 1.8 cm. Possible corpus luteum cyst within the left ovary measuring 1.9 cm. The uterus is unremarkable. Endometrium measures 7 mm. There is trace free fluid in the cul-de-sac. IMPRESSION: 1. No evidence of intrauterine pregnancy at this time, likely due to the early gestational age given low beta HCG value. Serial beta HCG measurements and follow-up ultrasound may be useful to document live intrauterine pregnancy. 2. Possible corpus luteum cyst left ovary. 3. Trace pelvic free fluid. Electronically Signed   By: Randa Ngo M.D.   On: 08/24/2020 15:41     MDM I-stat beta hCG in ED showed levels elevated at 239.4. HCG quantitative levels  in MAU show levels elevated at 312. CBC shows no significant abnormalities.  US performed to evaluate for ectopic pregnancy. US showed no evidence of intrauterine pregnancy.   Wet prep and GC/chlamydia completed to rule out infection. Wet prep results showed no significant abnormalities. GC/chlamydia results pending.   Assessment and Plan  1. Vaginal bleeding with abdominal pain 2. Elevated hCG levels 3. No evidence of intrauterine pregnancy on [place]  Patient educated on follow up care and appointment scheduled on  08/27/2020 for repeat hCG levels. Patient educated that ectopic or intrauterine pregnancy cannot be ruled out at this time. Repeat hCG levels at follow up appointment will reveal further information. Patient instructed to return to MAU if worsening pain or symptomatic bleeding.  Willaim Rayas 08/24/2020, 4:11 PM

## 2020-08-24 NOTE — ED Triage Notes (Signed)
Pt coming from home. Complaint of abdominal pain. Pt states that she has also been passing blood clots. VSS. NAD.

## 2020-08-24 NOTE — Discharge Instructions (Signed)
Return to care   If you have heavier bleeding that soaks through more that 2 pads per hour for an hour or more  If you bleed so much that you feel like you might pass out or you do pass out  If you have significant abdominal pain that is not improved with Tylenol     Vaginal Bleeding During Pregnancy, First Trimester  A small amount of bleeding from the vagina (spotting) is relatively common during early pregnancy. It usually stops on its own. Various things may cause bleeding or spotting during early pregnancy. Some bleeding may be related to the pregnancy, and some may not. In many cases, the bleeding is normal and is not a problem. However, bleeding can also be a sign of something serious. Be sure to tell your health care provider about any vaginal bleeding right away. Some possible causes of vaginal bleeding during the first trimester include:  Infection or inflammation of the cervix.  Growths (polyps) on the cervix.  Miscarriage or threatened miscarriage.  Pregnancy tissue developing outside of the uterus (ectopic pregnancy).  A mass of tissue developing in the uterus due to an egg being fertilized incorrectly (molar pregnancy). Follow these instructions at home: Activity  Follow instructions from your health care provider about limiting your activity. Ask what activities are safe for you.  If needed, make plans for someone to help with your regular activities.  Do not have sex or orgasms until your health care provider says that this is safe. General instructions  Take over-the-counter and prescription medicines only as told by your health care provider.  Pay attention to any changes in your symptoms.  Do not use tampons or douche.  Write down how many pads you use each day, how often you change pads, and how soaked (saturated) they are.  If you pass any tissue from your vagina, save the tissue so you can show it to your health care provider.  Keep all follow-up  visits as told by your health care provider. This is important. Contact a health care provider if:  You have vaginal bleeding during any part of your pregnancy.  You have cramps or labor pains.  You have a fever. Get help right away if:  You have severe cramps in your back or abdomen.  You pass large clots or a large amount of tissue from your vagina.  Your bleeding increases.  You feel light-headed or weak, or you faint.  You have chills.  You are leaking fluid or have a gush of fluid from your vagina. Summary  A small amount of bleeding (spotting) from the vagina is relatively common during early pregnancy.  Various things may cause bleeding or spotting in early pregnancy.  Be sure to tell your health care provider about any vaginal bleeding right away. This information is not intended to replace advice given to you by your health care provider. Make sure you discuss any questions you have with your health care provider. Document Revised: 01/21/2019 Document Reviewed: 01/04/2017 Elsevier Patient Education  2020 Elsevier Inc.   

## 2020-08-25 LAB — GC/CHLAMYDIA PROBE AMP (~~LOC~~) NOT AT ARMC
Chlamydia: NEGATIVE
Comment: NEGATIVE
Comment: NORMAL
Neisseria Gonorrhea: NEGATIVE

## 2020-08-27 ENCOUNTER — Ambulatory Visit (INDEPENDENT_AMBULATORY_CARE_PROVIDER_SITE_OTHER): Payer: Medicaid Other | Admitting: *Deleted

## 2020-08-27 ENCOUNTER — Encounter: Payer: Self-pay | Admitting: *Deleted

## 2020-08-27 ENCOUNTER — Other Ambulatory Visit: Payer: Self-pay

## 2020-08-27 VITALS — BP 126/84 | HR 81 | Ht 60.0 in | Wt 237.3 lb

## 2020-08-27 DIAGNOSIS — O209 Hemorrhage in early pregnancy, unspecified: Secondary | ICD-10-CM | POA: Diagnosis not present

## 2020-08-27 DIAGNOSIS — O3680X0 Pregnancy with inconclusive fetal viability, not applicable or unspecified: Secondary | ICD-10-CM

## 2020-08-27 DIAGNOSIS — O039 Complete or unspecified spontaneous abortion without complication: Secondary | ICD-10-CM

## 2020-08-27 LAB — BETA HCG QUANT (REF LAB): hCG Quant: 43 m[IU]/mL

## 2020-08-27 NOTE — Progress Notes (Signed)
Pt presents for stat BHCG following visit to MAU on 11/9. She reports less bleeding - changing her pad once daily. She is still having occasional sharp pain which is less than on 11/9. Pt was advised that she will be called later today with test results. She stated that a detailed message can be left on VM if she does not answer. She was instructed to return to MAU if she develops heavy vaginal bleeding or increase in abdominal pain. Pt had positive response to food insecurity questionnaire today. She was informed of our food market however declines need for food today. Pt will be called by a Engineer, materials with additional information. She voiced understanding of all information and instructions given.   1130 BHCG result (43) reviewed by Dr. Harolyn Rutherford who finds decrease of hormone consistent with definitive miscarriage. Pt will need weekly non-stat BHCG test until the level is <5. Pt was called and a detailed message was left on her voicemail stating test results as well as plan of care. She will be able to view her next appt in MyChart. She may contact the office if she has any questions.

## 2020-08-29 NOTE — Progress Notes (Signed)
Patient was assessed and managed by nursing staff during this encounter. I have reviewed the chart and agree with the documentation and plan. I have also made any necessary editorial changes.  Verita Schneiders, MD 08/29/2020 1:51 PM

## 2020-09-03 ENCOUNTER — Other Ambulatory Visit: Payer: Medicaid Other

## 2020-09-03 DIAGNOSIS — O039 Complete or unspecified spontaneous abortion without complication: Secondary | ICD-10-CM

## 2020-09-29 ENCOUNTER — Inpatient Hospital Stay (HOSPITAL_COMMUNITY)
Admission: AD | Admit: 2020-09-29 | Discharge: 2020-09-29 | Disposition: A | Discharge status: Home / Self Care | Payer: Medicaid Other | Attending: Obstetrics and Gynecology | Admitting: Obstetrics and Gynecology

## 2020-09-29 ENCOUNTER — Telehealth: Payer: Self-pay | Admitting: Lactation Services

## 2020-09-29 ENCOUNTER — Other Ambulatory Visit: Payer: Self-pay | Admitting: Obstetrics and Gynecology

## 2020-09-29 DIAGNOSIS — N939 Abnormal uterine and vaginal bleeding, unspecified: Secondary | ICD-10-CM | POA: Insufficient documentation

## 2020-09-29 LAB — HCG, QUANTITATIVE, PREGNANCY: hCG, Beta Chain, Quant, S: 1 m[IU]/mL (ref <5)

## 2020-09-29 MED ORDER — NORGESTIMATE-ETH ESTRADIOL 0.25-35 MG-MCG PO TABS: 1.0000 | ORAL_TABLET | Freq: Every day | ORAL | 11 refills | Status: DC

## 2020-09-29 NOTE — Telephone Encounter (Signed)
Patient called in and LM that was not able to understand.   Called patient back and she was seen in the ED last month with abdominal pain and bleeding. She reports she was told she was pregnant. She has an [place] and was told that she was most likely having a miscarriage. She was not able to follow up with her Hcg's as she had to work.   After that the bleeding and pain stopped. Then 2 weeks later the bleeding restarted. She reports she is having pain currently and bleeding. The pain is lower abdominal area, it is a sharp pain 9/10 that started after the bleeding. She reports her bleeding is heavy and she changes her pad every 2 hours. She reports the pain is much worse than her periods and that if feels like when she miscarried in November   She is having unprotected intercourse. She has not taken a pregnancy test yet this time.   Informed patient to go to the MAU for evaluation since last pregnancy episode was not resolved.

## 2020-09-29 NOTE — MAU Provider Note (Signed)
Per triage note, patient presented to MAU with bleeding. She had a recent miscarriage. Reports to the charge nurse she is unable to stay for results.   HCG level drawn while patient waiting in lobby however was not able to stay to speak to a provider regarding plan of care.   1830: patient called and notified of negative Hcg levels. All questions answered.  MSE not done prior to patient leaving.   Patient requests Bc pills until she is able to get into the office for DEPO. Allergies confirmed, No history of DVT or PE. Rx: Sprintec    Makel Mcmann, Artist Pais, NP 09/29/2020 6:35 PM

## 2020-09-29 NOTE — Progress Notes (Addendum)
Pt left prior to being seen by MAU provider although blood work was drawn while in waiting area.  Prior to leaving pt asked registration to call Nurse's station because she needed to leave.  Provider instructed registration staff to inform pt she may leave and will be called with results

## 2021-02-12 ENCOUNTER — Encounter (HOSPITAL_COMMUNITY): Payer: Self-pay | Admitting: Obstetrics and Gynecology

## 2021-02-12 ENCOUNTER — Other Ambulatory Visit: Payer: Self-pay

## 2021-02-12 ENCOUNTER — Inpatient Hospital Stay (HOSPITAL_COMMUNITY)
Admission: AD | Admit: 2021-02-12 | Discharge: 2021-02-12 | Disposition: A | Discharge status: Home / Self Care | Payer: Medicaid Other | Attending: Obstetrics and Gynecology | Admitting: Obstetrics and Gynecology

## 2021-02-12 DIAGNOSIS — Z3202 Encounter for pregnancy test, result negative: Secondary | ICD-10-CM | POA: Diagnosis not present

## 2021-02-12 DIAGNOSIS — N939 Abnormal uterine and vaginal bleeding, unspecified: Secondary | ICD-10-CM | POA: Diagnosis present

## 2021-02-12 LAB — POCT PREGNANCY, URINE: Preg Test, Ur: NEGATIVE

## 2021-02-12 NOTE — Discharge Instructions (Signed)
Prenatal Care Providers           [facility] [place] [facility] [place] [facility]  [place] [phone]  Center for [facility] @ [place]  [phone]  [place] @ [facility]       [address] [phone]            [facility] [place] @ [place]     [phone]          [facility] [place] @ [facility]   [place] [address] [phone]  [facility] [place] @ [address] [phone]     [facility] for [place] @ [address] Linna Hoff)  [place]   [phone]     [place] [facility]  Phone: [facility]  Phone: [facility] Phone: [phone]  [facility] Phone: [phone]  [facility] Phone: [phone]  [facility] Phone: [phone]  [facility]  Phone: [phone]    Abnormal Uterine Bleeding  Abnormal uterine bleeding is unusual bleeding from the uterus. It includes bleeding after sex, or bleeding or spotting between menstrual periods. It may also include bleeding that is heavier than normal, menstrual periods that last longer than usual, or bleeding that occurs after menopause. Abnormal uterine bleeding can affect teenagers, women in their reproductive years, pregnant women, and women who have reached menopause. Common causes of abnormal uterine bleeding include:  Pregnancy.  Growths of tissue (polyps).  Benign tumors or growths in the uterus (fibroids). These are not cancer.  Infection.  Cancer.  Too much or too little of some hormones in the body (hormonal imbalances). Any type of abnormal bleeding should be checked by a health care provider. Many cases are minor and simple to treat, but others may be more serious. Treatment will  depend on the cause and severity of the bleeding. Follow these instructions at home: Medicines  Take over-the-counter and prescription medicines only as told by your health care provider.  Tell your health care provider about other medicines that you take. You may be asked to stop taking aspirin or medicines that contain aspirin. These medicines can make bleeding worse.  If you were prescribed iron pills, take them as told by your health care provider. Iron pills help to replace iron that your body loses because of this condition. Managing constipation In cases of severe bleeding, you may be asked to increase your iron intake to treat anemia. This may cause constipation. To prevent or treat constipation, you may need to:  Drink enough fluid to keep your urine pale yellow.  Take over-the-counter or prescription medicines.  Eat foods that are high in fiber, such as beans, whole grains, and fresh fruits and vegetables.  Limit foods that are high in fat and processed sugars, such as fried or sweet foods. General instructions  Monitor your condition for any changes.  Do not use tampons, douche, or have sex until your health care provider says these things are okay.  Change your pads often.  Get regular exams. This includes pelvic exams and cervical cancer screenings. ? It is up to you to get the results of any tests that are done. Ask your health care provider, or the department that is doing the tests, when your results will be ready.  Keep all follow-up visits  as told by your health care provider. This is important. Contact a health care provider if you:  Have bleeding that lasts for more than 1 week.  Feel dizzy at times.  Feel nauseous or you vomit.  Feel light-headed or weak.  Notice any other changes that show that your condition is getting worse. Get help right away if you:  Pass out.  Have bleeding that soaks through a pad every hour.  Have pain in the  abdomen.  Have a fever or chills.  Become sweaty or weak.  Pass large blood clots from your vagina. Summary  Abnormal uterine bleeding is unusual bleeding from the uterus.  Any type of abnormal bleeding should be evaluated by a health care provider. Many cases are minor and simple to treat, but others may be more serious.  Treatment will depend on the cause of the bleeding.  Get help right away if you pass out, you have bleeding that soaks through a pad every hour, or you pass large blood clots from your vagina. This information is not intended to replace advice given to you by your health care provider. Make sure you discuss any questions you have with your health care provider. Document Revised: 06/09/2020 Document Reviewed: 08/05/2019 Elsevier Patient Education  [place].

## 2021-02-12 NOTE — MAU Provider Note (Signed)
Event Date/Time   First Provider Initiated Contact with Patient 02/12/21 1506      S Ms. Cindy [name] is a 25 y.o. [id number] patient who presents to MAU today with complaint of spotting. She states she finished her period two days ago and is spotting now. She reports a negative UPT at home.   O BP (!) 146/93 (BP Location: Right Arm)   Pulse 76   Temp 97.7 F (36.5 C) (Oral)   Resp 16   LMP 02/01/2021   SpO2 99%   Breastfeeding Unknown  Physical Exam Vitals and nursing note reviewed.  Constitutional:      General: She is not in acute distress.    Appearance: She is well-developed.  HENT:     Head: Normocephalic.  Eyes:     Pupils: Pupils are equal, round, and reactive to light.  Cardiovascular:     Rate and Rhythm: Normal rate and regular rhythm.  Pulmonary:     Effort: Pulmonary effort is normal. No respiratory distress.  Abdominal:     General: There is no distension.     Palpations: Abdomen is soft.     Tenderness: There is no abdominal tenderness.  Skin:    General: Skin is warm and dry.  Neurological:     Mental Status: She is alert and oriented to person, place, and time.  Psychiatric:        Behavior: Behavior normal.        Thought Content: Thought content normal.        Judgment: Judgment normal.     A Medical screening exam complete 1. Negative pregnancy test   2. Abnormal uterine bleeding      P Discharge from MAU in stable condition Patient given the option of transfer to [facility] for further evaluation or seek care in outpatient facility of choice  List of options for follow-up given  Warning signs for worsening condition that would warrant emergency follow-up discussed Patient may return to MAU as needed   Wende Mott, [place] 02/12/2021 3:09 PM

## 2021-02-12 NOTE — MAU Note (Signed)
Pt reports to mau with c/o vag spotting one week after having her period.  Pt states she hpt earlier this week and it was neg.  Reports lower abd cramping

## 2021-03-03 ENCOUNTER — Telehealth: Payer: Self-pay | Admitting: Family Medicine

## 2021-03-03 NOTE — Telephone Encounter (Signed)
I have questions about my period

## 2021-03-03 NOTE — Telephone Encounter (Signed)
Discussed with provider Antoine Primas, PA.

## 2021-03-03 NOTE — Telephone Encounter (Signed)
(  Pt seen at [facility] Nov 2020.)  Phone call to pt. Pt states her period is several days late and wondering if she can take pregnancy test. Pt further states the only time she has been late for her menses is when she was pregnant. Pt counseled that if she desires, she can take pregnancy test, they are available at several locations, and we also have pregnancy test appts at [facility]. Pt states she is sexually active and is not using birth control. Pt counseled that if UPT is negative and if her period does not start she could take a UPT test also about 2 weeks after last sex; she should follow-up with doctor/OBGYN/[facility] if she continues to have negative UPT and does not get her period. No appt scheduled at [facility] at this time.

## 2021-05-02 ENCOUNTER — Emergency Department (HOSPITAL_COMMUNITY)
Admission: EM | Admit: 2021-05-02 | Discharge: 2021-05-03 | Disposition: A | Discharge status: Home / Self Care | Payer: Medicaid Other | Attending: Student | Admitting: Student

## 2021-05-02 DIAGNOSIS — Z5321 Procedure and treatment not carried out due to patient leaving prior to being seen by health care provider: Secondary | ICD-10-CM | POA: Insufficient documentation

## 2021-05-02 DIAGNOSIS — R110 Nausea: Secondary | ICD-10-CM | POA: Diagnosis not present

## 2021-05-02 DIAGNOSIS — R102 Pelvic and perineal pain: Secondary | ICD-10-CM | POA: Diagnosis not present

## 2021-05-02 DIAGNOSIS — R109 Unspecified abdominal pain: Secondary | ICD-10-CM | POA: Diagnosis present

## 2021-05-02 LAB — URINALYSIS, ROUTINE W REFLEX MICROSCOPIC
Bilirubin Urine: NEGATIVE
Glucose, UA: NEGATIVE mg/dL
Hgb urine dipstick: NEGATIVE
Ketones, ur: 5 mg/dL — AB
Leukocytes,Ua: NEGATIVE
Nitrite: NEGATIVE
Protein, ur: NEGATIVE mg/dL
Specific Gravity, Urine: 1.025 (ref 1.005–1.030)
pH: 6 (ref 5.0–8.0)

## 2021-05-02 LAB — COMPREHENSIVE METABOLIC PANEL
ALT: 18 U/L (ref 0–44)
AST: 17 U/L (ref 15–41)
Albumin: 4.5 g/dL (ref 3.5–5.0)
Alkaline Phosphatase: 66 U/L (ref 38–126)
Anion gap: 9 (ref 5–15)
BUN: 12 mg/dL (ref 6–20)
CO2: 24 mmol/L (ref 22–32)
Calcium: 9.7 mg/dL (ref 8.9–10.3)
Chloride: 103 mmol/L (ref 98–111)
Creatinine, Ser: 0.71 mg/dL (ref 0.44–1.00)
GFR, Estimated: >60 mL/min (ref >60)
Glucose, Bld: 81 mg/dL (ref 70–99)
Potassium: 3.4 mmol/L — ABNORMAL LOW (ref 3.5–5.1)
Sodium: 136 mmol/L (ref 135–145)
Total Bilirubin: 0.6 mg/dL (ref 0.3–1.2)
Total Protein: 8.8 g/dL — ABNORMAL HIGH (ref 6.5–8.1)

## 2021-05-02 LAB — CBC WITH DIFFERENTIAL/PLATELET
Abs Immature Granulocytes: 0.03 10*3/uL (ref 0.00–0.07)
Basophils Absolute: 0.1 10*3/uL (ref 0.0–0.1)
Basophils Relative: 1 %
Eosinophils Absolute: 0.2 10*3/uL (ref 0.0–0.5)
Eosinophils Relative: 2 %
HCT: 41.5 % (ref 36.0–46.0)
Hemoglobin: 13.9 g/dL (ref 12.0–15.0)
Immature Granulocytes: 0 %
Lymphocytes Relative: 36 %
Lymphs Abs: 4 10*3/uL (ref 0.7–4.0)
MCH: 23.2 pg — ABNORMAL LOW (ref 26.0–34.0)
MCHC: 33.5 g/dL (ref 30.0–36.0)
MCV: 69.2 fL — ABNORMAL LOW (ref 80.0–100.0)
Monocytes Absolute: 0.8 10*3/uL (ref 0.1–1.0)
Monocytes Relative: 7 %
Neutro Abs: 6 10*3/uL (ref 1.7–7.7)
Neutrophils Relative %: 54 %
Platelets: 359 10*3/uL (ref 150–400)
RBC: 6 MIL/uL — ABNORMAL HIGH (ref 3.87–5.11)
RDW: 17.2 % — ABNORMAL HIGH (ref 11.5–15.5)
WBC: 11 10*3/uL — ABNORMAL HIGH (ref 4.0–10.5)
nRBC: 0 % (ref 0.0–0.2)

## 2021-05-02 LAB — I-STAT BETA HCG BLOOD, ED (MC, WL, AP ONLY): I-stat hCG, quantitative: <5 m[IU]/mL (ref <5)

## 2021-05-02 LAB — LIPASE, BLOOD: Lipase: 27 U/L (ref 11–51)

## 2021-05-02 NOTE — ED Provider Notes (Signed)
Emergency Medicine Provider Triage Evaluation Note  Cindy [name] , a 25 y.o. female  was evaluated in triage.  Pt complains of abdominal pain x2 weeks associated with nausea. LMP in June. Patient states abdominal pain starts in her upper quadrants then radiates to her lower abdomen and into pelvis region. No urinary symptoms. Denies vaginal discharge. No fever or chills. No previous abdominal operations. No emesis or diarrhea.  Review of Systems  Positive: Abdominal pain Negative: vomiting  Physical Exam  BP (!) 151/86 (BP Location: Right Arm)   Pulse 70   Temp 98.6 F (37 C) (Oral)   Resp 16   LMP  (LMP Unknown)   SpO2 100%  Gen:   Awake, no distress   Resp:  Normal effort  MSK:   Moves extremities without difficulty  Other:  Abdomen soft, nondistended, nontender to palpation in all quadrants without guarding or peritoneal signs. No rebound.    Medical Decision Making  Medically screening exam initiated at 5:11 PM.  Appropriate orders placed.  Harrington Challenger was informed that the remainder of the evaluation will be completed by another provider, this initial triage assessment does not replace that evaluation, and the importance of remaining in the ED until their evaluation is complete.  Abdominal labs ordered   Karie Kirks 05/02/21 1713    Pattricia Boss, MD 05/03/21 1535

## 2021-05-02 NOTE — ED Triage Notes (Signed)
C/O abdominal pain x 2 weeks; stated hx of "inflammation on my lower intestines" back in 2018; unsure of LMP.

## 2021-05-03 NOTE — ED Notes (Signed)
PT left AMA 

## 2021-06-26 ENCOUNTER — Emergency Department (HOSPITAL_COMMUNITY): Payer: Medicaid Other

## 2021-06-26 ENCOUNTER — Other Ambulatory Visit: Payer: Self-pay

## 2021-06-26 ENCOUNTER — Emergency Department (HOSPITAL_COMMUNITY)
Admission: EM | Admit: 2021-06-26 | Discharge: 2021-06-26 | Disposition: A | Discharge status: Home / Self Care | Payer: Medicaid Other | Attending: Emergency Medicine | Admitting: Emergency Medicine

## 2021-06-26 DIAGNOSIS — R109 Unspecified abdominal pain: Secondary | ICD-10-CM | POA: Diagnosis present

## 2021-06-26 DIAGNOSIS — D259 Leiomyoma of uterus, unspecified: Secondary | ICD-10-CM | POA: Insufficient documentation

## 2021-06-26 DIAGNOSIS — R110 Nausea: Secondary | ICD-10-CM | POA: Insufficient documentation

## 2021-06-26 DIAGNOSIS — R102 Pelvic and perineal pain: Secondary | ICD-10-CM

## 2021-06-26 DIAGNOSIS — R8271 Bacteriuria: Secondary | ICD-10-CM | POA: Diagnosis not present

## 2021-06-26 LAB — URINALYSIS, ROUTINE W REFLEX MICROSCOPIC
Bilirubin Urine: NEGATIVE
Glucose, UA: NEGATIVE mg/dL
Hgb urine dipstick: NEGATIVE
Ketones, ur: NEGATIVE mg/dL
Leukocytes,Ua: NEGATIVE
Nitrite: POSITIVE — AB
Protein, ur: NEGATIVE mg/dL
Specific Gravity, Urine: 1.025 (ref 1.005–1.030)
pH: 6 (ref 5.0–8.0)

## 2021-06-26 LAB — COMPREHENSIVE METABOLIC PANEL
ALT: 16 U/L (ref 0–44)
AST: 15 U/L (ref 15–41)
Albumin: 4.2 g/dL (ref 3.5–5.0)
Alkaline Phosphatase: 54 U/L (ref 38–126)
Anion gap: 7 (ref 5–15)
BUN: 13 mg/dL (ref 6–20)
CO2: 25 mmol/L (ref 22–32)
Calcium: 9.2 mg/dL (ref 8.9–10.3)
Chloride: 109 mmol/L (ref 98–111)
Creatinine, Ser: 0.59 mg/dL (ref 0.44–1.00)
GFR, Estimated: >60 mL/min (ref >60)
Glucose, Bld: 99 mg/dL (ref 70–99)
Potassium: 3.4 mmol/L — ABNORMAL LOW (ref 3.5–5.1)
Sodium: 141 mmol/L (ref 135–145)
Total Bilirubin: 0.3 mg/dL (ref 0.3–1.2)
Total Protein: 8 g/dL (ref 6.5–8.1)

## 2021-06-26 LAB — CBC
HCT: 37.3 % (ref 36.0–46.0)
Hemoglobin: 12.7 g/dL (ref 12.0–15.0)
MCH: 23.5 pg — ABNORMAL LOW (ref 26.0–34.0)
MCHC: 34 g/dL (ref 30.0–36.0)
MCV: 68.9 fL — ABNORMAL LOW (ref 80.0–100.0)
Platelets: 340 10*3/uL (ref 150–400)
RBC: 5.41 MIL/uL — ABNORMAL HIGH (ref 3.87–5.11)
RDW: 15.8 % — ABNORMAL HIGH (ref 11.5–15.5)
WBC: 5.4 10*3/uL (ref 4.0–10.5)
nRBC: 0 % (ref 0.0–0.2)

## 2021-06-26 LAB — POC URINE PREG, ED: Preg Test, Ur: NEGATIVE

## 2021-06-26 LAB — LIPASE, BLOOD: Lipase: 24 U/L (ref 11–51)

## 2021-06-26 LAB — WET PREP, GENITAL
Clue Cells Wet Prep HPF POC: NONE SEEN
Sperm: NONE SEEN
Trich, Wet Prep: NONE SEEN
Yeast Wet Prep HPF POC: NONE SEEN

## 2021-06-26 MED ORDER — CEPHALEXIN 500 MG PO CAPS: 500.0000 mg | ORAL_CAPSULE | Freq: Four times a day (QID) | ORAL | 0 refills | Status: DC

## 2021-06-26 MED ORDER — KETOROLAC TROMETHAMINE 30 MG/ML IJ SOLN
30.0000 mg | Freq: Once | INTRAMUSCULAR | Status: AC
  Administered 2021-06-26: 30 mg via INTRAMUSCULAR
  Filled 2021-06-26: qty 1

## 2021-06-26 MED ORDER — ONDANSETRON 4 MG PO TBDP
4.0000 mg | ORAL_TABLET | Freq: Once | ORAL | Status: AC
  Administered 2021-06-26: 4 mg via ORAL
  Filled 2021-06-26: qty 1

## 2021-06-26 MED ORDER — POTASSIUM CHLORIDE CRYS ER 20 MEQ PO TBCR
20.0000 meq | EXTENDED_RELEASE_TABLET | Freq: Once | ORAL | Status: AC
  Administered 2021-06-26: 20 meq via ORAL
  Filled 2021-06-26: qty 1

## 2021-06-26 NOTE — ED Provider Notes (Signed)
[place] DEPT Provider Note   CSN: [id number] Arrival date & time: 06/26/21  [id number]     History Chief Complaint  Patient presents with   Abdominal Pain    Cindy [name] is a 25 y.o. female.   Abdominal Pain Associated symptoms: nausea   Associated symptoms: no chest pain, no chills, no cough, no dysuria, no fever, no hematuria, no shortness of breath, no sore throat, no vaginal bleeding, no vaginal discharge and no vomiting    25 year old female with a history of morbid obesity, anemia, depression, presenting to the emergency department with a complaint of bilateral lower abdominal pain that has been persistent for the past 2 weeks.  She reports associated nausea. She states that the pain is described as a pressure sensation in her lower abdomen.  She endorses no dysuria or increased urinary frequency.  She denies any flank pain.  She states that the pain in her abdomen does radiate to her back.  She denies any fever or chills.  She denies any vaginal bleeding, abnormal vaginal discharge.  She states that she has been sexually active with 1 female partner for the past 5 years.  She is not as worried about sexually transmitted infections.  She denies any fevers or chills.  She denies any vomiting.  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   Iron deficiency anemia 03/14/2019    Past Surgical History:  Procedure Laterality Date   COLONOSCOPY WITH PROPOFOL N/A 06/15/2017   Procedure: COLONOSCOPY WITH PROPOFOL;  Surgeon: [name], MD;  Location: [facility] ENDOSCOPY;  Service: Gastroenterology;  Laterality: N/A;    ESOPHAGOGASTRODUODENOSCOPY (EGD) WITH PROPOFOL N/A 06/15/2017   Procedure: ESOPHAGOGASTRODUODENOSCOPY (EGD) WITH PROPOFOL;  Surgeon: [name], MD;  Location: [facility] ENDOSCOPY;  Service: Gastroenterology;  Laterality: N/A;   TONSILLECTOMY       OB History     Gravida  3   Para  2   Term  2   Preterm      AB      Living  2      SAB      IAB      Ectopic      Multiple  0   Live Births  2           Family History  Problem Relation Age of Onset   Thyroid disease Mother    Hypertension Mother    Sickle cell trait Mother    Sickle cell anemia Sister    Hypertension Maternal Grandmother     Social History   Tobacco Use   Smoking status: Never   Smokeless tobacco: Never  Vaping Use   Vaping Use: Never used  Substance Use Topics   Alcohol use: No    Comment: former   Drug use: Not Currently    Types: Marijuana    Comment: last used in sept    Home Medications Prior to Admission medications   Medication Sig Start Date End Date Taking? Authorizing Provider  cephALEXin (KEFLEX) 500 MG capsule Take 1 capsule (500 mg total)  by mouth 4 (four) times daily. 06/26/21  Yes Regan Lemming, MD  Multiple Vitamins-Minerals (MULTIVITAMIN) tablet Take 1 tablet by mouth daily. Patient not taking: Reported on 06/26/2021 07/17/19   Caren Macadam, MD  norgestimate-ethinyl estradiol (ORTHO-CYCLEN) 0.25-35 MG-MCG tablet Take 1 tablet by mouth daily. Patient not taking: Reported on 06/26/2021 09/29/20   Rasch, Artist Pais, NP    Allergies    Patient has no known allergies.  Review of Systems   Review of Systems  Constitutional:  Negative for chills and fever.  HENT:  Negative for ear pain and sore throat.   Eyes:  Negative for pain and visual disturbance.  Respiratory:  Negative for cough and shortness of breath.   Cardiovascular:  Negative for chest pain and palpitations.  Gastrointestinal:  Positive for abdominal pain and nausea. Negative for vomiting.   Genitourinary:  Positive for pelvic pain. Negative for dysuria, flank pain, hematuria, vaginal bleeding, vaginal discharge and vaginal pain.  Musculoskeletal:  Negative for arthralgias and back pain.  Skin:  Negative for color change and rash.  Neurological:  Negative for seizures and syncope.  All other systems reviewed and are negative.  Physical Exam Updated Vital Signs BP (!) 145/85   Pulse 74   Temp 98.2 F (36.8 C) (Oral)   Resp 18   LMP 06/01/2021   SpO2 99%   Physical Exam Vitals and nursing note reviewed. Exam conducted with a chaperone present.  Constitutional:      General: She is not in acute distress. HENT:     Head: Normocephalic and atraumatic.  Eyes:     Conjunctiva/sclera: Conjunctivae normal.     Pupils: Pupils are equal, round, and reactive to light.  Cardiovascular:     Rate and Rhythm: Normal rate and regular rhythm.  Pulmonary:     Effort: Pulmonary effort is normal. No respiratory distress.  Abdominal:     General: There is no distension.     Tenderness: There is abdominal tenderness in the suprapubic area. There is no right CVA tenderness, left CVA tenderness, guarding or rebound.  Genitourinary:    Cervix: Normal.     Uterus: Normal. Not tender.      Adnexa: Right adnexa normal and left adnexa normal.     Comments: Small amount of green mucous present. Negative CMT or adnexal tenderness Musculoskeletal:        General: No deformity or signs of injury.     Cervical back: Normal range of motion and neck supple.  Skin:    Findings: No lesion or rash.  Neurological:     General: No focal deficit present.     Mental Status: She is alert. Mental status is at baseline.    ED Results / Procedures / Treatments   Labs (all labs ordered are listed, but only abnormal results are displayed) Labs Reviewed  WET PREP, GENITAL - Abnormal; Notable for the following components:      Result Value   WBC, Wet Prep HPF POC MANY (*)    All other components  within normal limits  COMPREHENSIVE METABOLIC PANEL - Abnormal; Notable for the following components:   Potassium 3.4 (*)    All other components within normal limits  CBC - Abnormal; Notable for the following components:   RBC 5.41 (*)    MCV 68.9 (*)    MCH 23.5 (*)    RDW 15.8 (*)    All other components within normal limits  URINALYSIS, ROUTINE W REFLEX MICROSCOPIC - Abnormal; Notable for  the following components:   Color, Urine YELLOW (*)    APPearance CLEAR (*)    Nitrite POSITIVE (*)    Bacteria, UA RARE (*)    All other components within normal limits  LIPASE, BLOOD  POC URINE PREG, ED  GC/CHLAMYDIA PROBE AMP ([place]) NOT AT [facility]    EKG None  Radiology US PELVIC COMPLETE W TRANSVAGINAL AND TORSION R/O  Result Date: 06/26/2021 CLINICAL DATA:  Pelvic pain EXAM: TRANSABDOMINAL AND TRANSVAGINAL ULTRASOUND OF PELVIS DOPPLER ULTRASOUND OF OVARIES TECHNIQUE: Both transabdominal and transvaginal ultrasound examinations of the pelvis were performed. Transabdominal technique was performed for global imaging of the pelvis including uterus, ovaries, adnexal regions, and pelvic cul-de-sac. It was necessary to proceed with endovaginal exam following the transabdominal exam to visualize the ovaries. Color and duplex Doppler ultrasound was utilized to evaluate blood flow to the ovaries. COMPARISON:  Pelvic ultrasound June 13, 2017 FINDINGS: Uterus Measurements: 10.5 x 4.2 x 5.5 cm = volume: 125.1 mL. There are two hypoechoic lesions in the uterine myometrium each measuring 1.5 cm consistent with uterine fibroid. Endometrium Thickness: 6 mm.  No focal abnormality visualized. Right ovary Measurements: 4.1 x 2.1 x 2.9 cm = volume: 13 mL. Normal appearance/no adnexal mass. Left ovary Measurements: 3.2 x 1.5 x 2 1 cm = volume: 5 1 mL. Normal appearance/no adnexal mass. Pulsed Doppler evaluation of both ovaries demonstrates normal low-resistance arterial and venous waveforms. Other findings Trace  free fluid is identified. IMPRESSION: Uterine fibroids. No evidence ovarian torsion. Trace free fluid in the pelvis likely physiologic. Electronically Signed   By: Abelardo Diesel M.D.   On: 06/26/2021 13:03    Procedures Procedures   Medications Ordered in ED Medications  ondansetron (ZOFRAN-ODT) disintegrating tablet 4 mg (4 mg Oral Given 06/26/21 0839)  potassium chloride SA (KLOR-CON) CR tablet 20 mEq (20 mEq Oral Given 06/26/21 1123)  ketorolac (TORADOL) 30 MG/ML injection 30 mg (30 mg Intramuscular Given 06/26/21 1123)    ED Course  I have reviewed the triage vital signs and the nursing notes.  Pertinent labs & imaging results that were available during my care of the patient were reviewed by me and considered in my medical decision making (see chart for details).    MDM Rules/Calculators/A&P                           25 year old female with a history of morbid obesity, anemia, depression, presenting to the emergency department with a complaint of bilateral lower abdominal pain that has been persistent for the past 2 weeks.  She reports associated nausea. She states that the pain is described as a pressure sensation in her lower abdomen.  She endorses no dysuria or increased urinary frequency.  She denies any flank pain.  She states that the pain in her abdomen does radiate to her back.  She denies any fever or chills.  She denies any vaginal bleeding, abnormal vaginal discharge.  She states that she has been sexually active with 1 female partner for the past 5 years.  She is not as worried about sexually transmitted infections.  She denies any fevers or chills.  She denies any vomiting.  On arrival, the patient was afebrile, hemodynamically stable.  She does have some suprapubic tenderness to palpation.  Wet prep collected without evidence of bacterial vaginosis, candidiasis, trichomonas.  Low suspicion for STI according to the patient given her 14 female partner for the past 5 years.  She  has  been overall asymptomatic.  Considered ovarian cyst, endometriosis, ovarian torsion, less likely PID.  Will hold on treatment for PID after discussing with patient her lack of concern for STI at this time.  GC/chlamydia collected and pending.  Ultrasound of the pelvis resulted positive for 2 uterine fibroids which may be the etiology for the patient's discomfort.  The patient has few bacteria with positive nitrites in her urine.  Urine pregnancy negative.  No leukocytosis or anemia.  Lower suspicion for urinary tract infection but with the suprapubic tenderness, we will go ahead and treat with 5 days of Keflex and have the patient follow-up regarding her uterine fibroids with an OB/GYN outpatient. Final Clinical Impression(s) / ED Diagnoses Final diagnoses:  Pelvic pain  Uterine leiomyoma, unspecified location  Bacteria in urine    Rx / DC Orders ED Discharge Orders          Ordered    cephALEXin (KEFLEX) 500 MG capsule  4 times daily        06/26/21 1338             Regan Lemming, MD 06/26/21 1927

## 2021-06-26 NOTE — ED Triage Notes (Signed)
Pt c/o lower abdominal pain and nausea x 2 weeks.

## 2021-06-27 LAB — GC/CHLAMYDIA PROBE AMP (~~LOC~~) NOT AT ARMC
Chlamydia: NEGATIVE
Comment: NEGATIVE
Comment: NORMAL
Neisseria Gonorrhea: NEGATIVE

## 2021-06-28 ENCOUNTER — Encounter: Payer: Self-pay | Admitting: Oncology

## 2021-06-29 ENCOUNTER — Ambulatory Visit: Payer: Medicaid Other | Admitting: Obstetrics and Gynecology

## 2021-07-05 ENCOUNTER — Ambulatory Visit: Payer: Medicaid Other | Admitting: Advanced Practice Midwife

## 2021-07-06 ENCOUNTER — Ambulatory Visit (INDEPENDENT_AMBULATORY_CARE_PROVIDER_SITE_OTHER): Payer: Medicaid Other | Admitting: Obstetrics and Gynecology

## 2021-07-06 ENCOUNTER — Encounter: Payer: Self-pay | Admitting: Obstetrics and Gynecology

## 2021-07-06 ENCOUNTER — Other Ambulatory Visit (HOSPITAL_COMMUNITY)
Admission: RE | Admit: 2021-07-06 | Discharge: 2021-07-06 | Disposition: A | Discharge status: Home / Self Care | Payer: Medicaid Other | Source: Ambulatory Visit | Attending: Obstetrics and Gynecology | Admitting: Obstetrics and Gynecology

## 2021-07-06 ENCOUNTER — Other Ambulatory Visit: Payer: Self-pay

## 2021-07-06 VITALS — BP 135/84 | Ht 60.0 in | Wt 227.0 lb

## 2021-07-06 DIAGNOSIS — R1030 Lower abdominal pain, unspecified: Secondary | ICD-10-CM | POA: Diagnosis not present

## 2021-07-06 DIAGNOSIS — Z124 Encounter for screening for malignant neoplasm of cervix: Secondary | ICD-10-CM | POA: Insufficient documentation

## 2021-07-06 NOTE — Progress Notes (Signed)
Obstetrics & Gynecology Office Visit   Chief Complaint  Patient presents with   Follow-up    ER Follow up for fibroids   Fibroids    History of Present Illness: 25 y.o. [id number] female who presents in follow up from an ER visit due to abdominal pain.  She went to the ER on 9/11 for lower abdominal pain. The pain had been present for 2 weeks prior to going to the ER.  The pain was located in her mid, lower abdomen. The pain did not radiate. She describes the pain as a numb feeling. She rated the pain as 9-10/10. The pain was constant.  Alleviating factors: some positional changes would help for a short while.  Aggravating factors: lying down would make the pain go to her hips.  Associated factors: blood pressure was up and down, migraines this past week.  She was treated with keflex for a UTI.  She had a pelvic ultrasound that showed two 1.5 cm fibroids.    She has a history of an ectopic pregnancy and an SAB.  She states the ectopic pregnancy was not treated. But, the hormone level was followed until it went away.    She has somewhat irregular periods and has been attempting pregnancy for about 1 year.     Past Medical History:  Diagnosis Date   Anemia    Depression    is past hx but not active now per pt   Morbidly obese ([place])    notes from health dept as hx    Past Surgical History:  Procedure Laterality Date   COLONOSCOPY WITH PROPOFOL N/A 06/15/2017   Procedure: COLONOSCOPY WITH PROPOFOL;  Surgeon: [name], MD;  Location: [facility] ENDOSCOPY;  Service: Gastroenterology;  Laterality: N/A;   ESOPHAGOGASTRODUODENOSCOPY (EGD) WITH PROPOFOL N/A 06/15/2017   Procedure: ESOPHAGOGASTRODUODENOSCOPY (EGD) WITH PROPOFOL;  Surgeon: [name], MD;  Location: [facility] ENDOSCOPY;  Service: Gastroenterology;  Laterality: N/A;   TONSILLECTOMY      Gynecologic History: Patient's last menstrual period was 07/04/2021 (exact date).  Obstetric History: [id number]  Family History  Problem Relation Age  of Onset   Thyroid disease Mother    Hypertension Mother    Sickle cell trait Mother    Sickle cell anemia Sister    Hypertension Maternal Grandmother     Social History   Socioeconomic History   Marital status: Significant Other    Spouse name: Building surveyor   Number of children: 1   Years of education: Not on file   Highest education level: 9th grade  Occupational History   Not on file  Tobacco Use   Smoking status: Never   Smokeless tobacco: Never  Vaping Use   Vaping Use: Never used  Substance and Sexual Activity   Alcohol use: No    Comment: former   Drug use: Not Currently    Types: Marijuana    Comment: last used in sept   Sexual activity: Not Currently    Partners: Male    Birth control/protection: Injection  Other Topics Concern   Not on file  Social History Narrative   Not on file   Social Determinants of Health   Financial Resource Strain: Not on file  Food Insecurity: Food Insecurity Present   Worried About [place] in the Last Year: Sometimes true   Ran Out of Food in the Last Year: Often true  Transportation Needs: No Transportation Needs   Lack of Transportation (Medical): No   Lack of Transportation (Non-Medical):  No  Physical Activity: Not on file  Stress: Not on file  Social Connections: Not on file  Intimate Partner Violence: Not on file    No Known Allergies  Prior to Admission medications   Medication Sig Start Date End Date Taking? Authorizing Provider  cephALEXin (KEFLEX) 500 MG capsule Take 1 capsule (500 mg total) by mouth 4 (four) times daily. Patient not taking: Reported on 07/06/2021 06/26/21   Regan Lemming, MD    Review of Systems  Constitutional: Negative.   HENT: Negative.    Eyes: Negative.   Respiratory: Negative.    Cardiovascular: Negative.   Gastrointestinal:  Positive for abdominal pain. Negative for blood in stool, constipation, diarrhea, heartburn, melena, nausea and vomiting.  Genitourinary:  Negative.   Musculoskeletal: Negative.   Skin: Negative.   Neurological: Negative.   Psychiatric/Behavioral: Negative.      Physical Exam BP 135/84   Ht 5' (1.524 m)   Wt 227 lb (103 kg)   LMP 07/04/2021 (Exact Date)   BMI 44.33 kg/m  Patient's last menstrual period was 07/04/2021 (exact date). Physical Exam Constitutional:      General: She is not in acute distress.    Appearance: Normal appearance. She is well-developed.  Genitourinary:     Vulva, bladder and urethral meatus normal.     Right Labia: No rash, tenderness, lesions, skin changes or Bartholin's cyst.    Left Labia: No tenderness, lesions, skin changes, Bartholin's cyst or rash.    No inguinal adenopathy present in the right or left side.    Pelvic Tanner Score: 5/5.     Right Adnexa: not tender, not full and no mass present.    Left Adnexa: not tender, not full and no mass present.    No cervical motion tenderness, friability, lesion or polyp.     Uterus is not enlarged, fixed or tender.     Uterus is anteverted.     No urethral tenderness or mass present.     Pelvic exam was performed with patient in the lithotomy position.  HENT:     Head: Normocephalic and atraumatic.  Eyes:     General: No scleral icterus.    Conjunctiva/sclera: Conjunctivae normal.  Neck:     Comments: +acanthosis nigricans Cardiovascular:     Rate and Rhythm: Normal rate and regular rhythm.     Heart sounds: No murmur heard.   No friction rub. No gallop.  Pulmonary:     Effort: Pulmonary effort is normal. No respiratory distress.     Breath sounds: Normal breath sounds. No wheezing or rales.  Abdominal:     General: Bowel sounds are normal. There is no distension.     Palpations: Abdomen is soft. There is no mass.     Tenderness: There is abdominal tenderness (mild ttp). There is no guarding or rebound.     Hernia: There is no hernia in the left inguinal area or right inguinal area.  Musculoskeletal:        General: Normal range  of motion.     Cervical back: Normal range of motion and neck supple.  Lymphadenopathy:     Lower Body: No right inguinal adenopathy. No left inguinal adenopathy.  Neurological:     General: No focal deficit present.     Mental Status: She is alert and oriented to person, place, and time.     Cranial Nerves: No cranial nerve deficit.  Skin:    General: Skin is warm and dry.  Findings: No erythema.  Psychiatric:        Mood and Affect: Mood normal.        Behavior: Behavior normal.        Judgment: Judgment normal.    Female chaperone present for pelvic and breast  portions of the physical exam  Assessment: 25 y.o. [id number] female here for  1. Lower abdominal pain   2. Pap smear for cervical cancer screening      Plan: Problem List Items Addressed This Visit   None Visit Diagnoses     Lower abdominal pain    -  Primary   Relevant Orders   Cytology - PAP   Pap smear for cervical cancer screening       Relevant Orders   Cytology - PAP      The patient has had a pretty considerable work-up for her abdominal pain.  The one study that she has not had would be a CT scan.  However, given her dearth of other symptoms, a CT scan is unlikely to be beneficial for diagnosis.  If her symptoms continue, a CT scan may be the best next step.  Her pain is unlikely related to the 2 small fibroid she has.  Etiology for lower abdominal pain could be gynecologic, urologic, gastroenterologic, musculoskeletal, and occasionally psychiatric.  The patient should continue to monitor her symptoms and if they get worse, return to the ER.  A Pap smear was collected today as the patient was due.  A total of 35 minutes were spent face-to-face with the patient as well as preparation, review, communication, and documentation during this encounter.    Prentice Docker, MD 07/06/2021 10:00 AM

## 2021-07-11 LAB — CYTOLOGY - PAP: Diagnosis: HIGH — AB

## 2021-07-12 ENCOUNTER — Telehealth: Payer: Self-pay | Admitting: Obstetrics and Gynecology

## 2021-07-12 DIAGNOSIS — R1030 Lower abdominal pain, unspecified: Secondary | ICD-10-CM

## 2021-07-12 DIAGNOSIS — R87613 High grade squamous intraepithelial lesion on cytologic smear of cervix (HGSIL): Secondary | ICD-10-CM

## 2021-07-12 NOTE — Telephone Encounter (Signed)
Discussed Pap smear findings.  Strongly recommend colposcopy to this patient.  Discussed risk of cervical cancer without further assessment and possible treatment.  Patient voiced understanding and concern for her abnormal Pap smear of H SIL.  Patient continues to have significant lower abdominal pain.  She has had a negative ultrasound and otherwise a negative work-up.  We will order a CT of the abdomen and pelvis for soon as we can get one.  All questions answered.

## 2021-07-20 ENCOUNTER — Ambulatory Visit
Admission: RE | Admit: 2021-07-20 | Discharge: 2021-07-20 | Disposition: A | Discharge status: Home / Self Care | Payer: Medicaid Other | Source: Ambulatory Visit | Attending: Obstetrics and Gynecology | Admitting: Obstetrics and Gynecology

## 2021-07-20 ENCOUNTER — Other Ambulatory Visit: Payer: Self-pay

## 2021-07-20 ENCOUNTER — Other Ambulatory Visit: Payer: Self-pay | Admitting: Obstetrics & Gynecology

## 2021-07-20 DIAGNOSIS — R1030 Lower abdominal pain, unspecified: Secondary | ICD-10-CM | POA: Diagnosis not present

## 2021-07-20 LAB — HCG, QUANTITATIVE, PREGNANCY: hCG, Beta Chain, Quant, S: 1 m[IU]/mL (ref <5)

## 2021-07-20 MED ORDER — IOHEXOL 350 MG/ML SOLN
80.0000 mL | Freq: Once | INTRAVENOUS | Status: AC | PRN
  Administered 2021-07-20: 80 mL via INTRAVENOUS

## 2021-07-26 ENCOUNTER — Telehealth: Payer: Self-pay | Admitting: Obstetrics and Gynecology

## 2021-07-26 ENCOUNTER — Telehealth: Payer: Self-pay

## 2021-07-26 DIAGNOSIS — N719 Inflammatory disease of uterus, unspecified: Secondary | ICD-10-CM

## 2021-07-26 MED ORDER — DOXYCYCLINE HYCLATE 100 MG PO CAPS: 100.0000 mg | ORAL_CAPSULE | Freq: Two times a day (BID) | ORAL | 0 refills | Status: AC

## 2021-07-26 NOTE — Telephone Encounter (Signed)
Pt called to discuss her colpo on the 26th, wanting to know exactly what the process was for this procedure, she has a low pain tolerance and wanted to know if she could be put to sleep. I called her back and she didn't answer, will answer any questions when pt calls back

## 2021-07-26 NOTE — Telephone Encounter (Signed)
This pt called in and wanting to discuss her test results.  She would like for you to give her a call whenever possible.

## 2021-07-26 NOTE — Telephone Encounter (Signed)
Discussed CT results that were normal.  Encouraged the patient to follow-up for a colposcopy given her H SIL results on Pap smear.  Discussed that this is how cancer is prevented.  She voiced understanding and agreement to come to the procedure. We discussed potential etiologies of her pain.  She did have a large number of white blood cells on her wet prep with no infectious findings.  These specifically were bacterial vaginosis, yeast, trichomonas, gonorrhea, and chlamydia.  We will treat for chronic endometritis with doxycycline.  If this does not work, may have to consider a different organic source.

## 2021-08-03 ENCOUNTER — Telehealth: Payer: Self-pay

## 2021-08-03 NOTE — Telephone Encounter (Signed)
Pt returned call without listening to vm.  Pt aware of what to do.

## 2021-08-03 NOTE — Telephone Encounter (Signed)
Pt calling; her pain is not getting better; she just started spotting today; has procedure next week; does the spotting have anything to do with what's going on with her?  No period last month.  [phone]  Left detailed msg did not think spotting was due to abnl pap and upcoming procedure; to do a home preg test; keep appt on the 26th; may take IBU 600mg  q6h or 800mg  q8hrs; apply heat 72min out of each hour while awake.

## 2021-08-10 ENCOUNTER — Ambulatory Visit: Payer: Medicaid Other | Admitting: Obstetrics and Gynecology

## 2021-08-10 ENCOUNTER — Encounter: Payer: Self-pay | Admitting: Obstetrics and Gynecology

## 2021-08-10 ENCOUNTER — Other Ambulatory Visit (HOSPITAL_COMMUNITY)
Admission: RE | Admit: 2021-08-10 | Discharge: 2021-08-10 | Disposition: A | Discharge status: Home / Self Care | Payer: Medicaid Other | Source: Ambulatory Visit | Attending: Obstetrics and Gynecology | Admitting: Obstetrics and Gynecology

## 2021-08-10 ENCOUNTER — Other Ambulatory Visit: Payer: Self-pay

## 2021-08-10 ENCOUNTER — Ambulatory Visit (INDEPENDENT_AMBULATORY_CARE_PROVIDER_SITE_OTHER): Payer: Medicaid Other | Admitting: Obstetrics and Gynecology

## 2021-08-10 VITALS — BP 118/74 | Ht 60.0 in

## 2021-08-10 DIAGNOSIS — R87613 High grade squamous intraepithelial lesion on cytologic smear of cervix (HGSIL): Secondary | ICD-10-CM

## 2021-08-10 NOTE — Progress Notes (Signed)
HPI:  Cindy [name] is a 25 y.o.  [id number]  who presents today for evaluation and management of abnormal cervical cytology.    Dysplasia History:   HGSIL 07/06/2021  Patient's last menstrual period was 08/05/2021 (exact date).   OB History  Gravida Para Term Preterm AB Living  3 2 2     2   SAB IAB Ectopic Multiple Live Births        0 2    # Outcome Date GA Lbr Len/2nd Weight Sex Delivery Anes PTL Lv  3 Gravida           2 Term 06/05/19 [redacted]w[redacted]d 403:54 / 03:46 6 lb 10.9 oz (3.03 kg) F Vag-Spont EPI  LIV  1 Term 10/24/16 [redacted]w[redacted]d 07:53 / 00:07 7 lb 1.9 oz (3.23 kg) F Vag-Spont EPI  LIV    Past Medical History:  Diagnosis Date   Anemia    Depression    is past hx but not active now per pt   Morbidly obese ([place])    notes from health dept as hx    Past Surgical History:  Procedure Laterality Date   COLONOSCOPY WITH PROPOFOL N/A 06/15/2017   Procedure: COLONOSCOPY WITH PROPOFOL;  Surgeon: [name], MD;  Location: [facility] ENDOSCOPY;  Service: Gastroenterology;  Laterality: N/A;   ESOPHAGOGASTRODUODENOSCOPY (EGD) WITH PROPOFOL N/A 06/15/2017   Procedure: ESOPHAGOGASTRODUODENOSCOPY (EGD) WITH PROPOFOL;  Surgeon: [name], MD;  Location: [facility] ENDOSCOPY;  Service: Gastroenterology;  Laterality: N/A;   TONSILLECTOMY      SOCIAL HISTORY:  Social History   Substance and Sexual Activity  Alcohol Use No   Comment: former    Social History   Substance and Sexual Activity  Drug Use Not Currently   Types: Marijuana   Comment: last used in sept     Family History  Problem Relation Age of Onset   Thyroid disease Mother    Hypertension Mother    Sickle cell trait Mother    Sickle cell anemia Sister    Hypertension Maternal Grandmother     ALLERGIES:  Patient has no known allergies.  Current Outpatient Medications on File Prior to Visit  Medication Sig Dispense Refill   cephALEXin (KEFLEX) 500 MG capsule Take 1 capsule (500 mg total) by mouth 4 (four) times daily.  (Patient not taking: Reported on 07/06/2021) 20 capsule 0   Multiple Vitamins-Minerals (MULTIVITAMIN) tablet Take 1 tablet by mouth daily. (Patient not taking: Reported on 07/06/2021) 100 tablet 0   norgestimate-ethinyl estradiol (ORTHO-CYCLEN) 0.25-35 MG-MCG tablet Take 1 tablet by mouth daily. (Patient not taking: Reported on 07/06/2021) 30 tablet 11   No current facility-administered medications on file prior to visit.    Physical Exam: -Vitals:  BP 118/74   Ht 5' (1.524 m)   LMP 08/05/2021 (Exact Date)   BMI 44.33 kg/m  GEN: WD, WN, NAD.  A+ O x 3, good mood and affect. ABD:  NT, ND.  Soft, no masses.  No hernias noted.   Pelvic:   Vulva: Normal appearance.  No lesions.  Vagina: No lesions or abnormalities noted.  Support: Normal pelvic support.  Urethra No masses tenderness or scarring.  Meatus Normal size without lesions or prolapse.  Cervix: See below.  Anus: Normal exam.  No lesions.  Perineum: Normal exam.  No lesions.        Bimanual   Uterus: Normal size.  Non-tender.  Mobile.  AV.  Adnexae: No masses.  Non-tender to palpation.  Cul-de-sac: Negative for abnormality.  PROCEDURE: 1.  Urine Pregnancy Test:  not done due to recent LMP 2.  Colposcopy performed with 4% acetic acid after verbal consent obtained                                         -Aceto-white Lesions Location(s): most strongly at 10 o'clock, small areas at 1 and 3 o'clock. Posterior cervix not as well visualized, mild areas visualized.              -Biopsy performed at 1, 3, 6, and 10 o'clock               -ECC indicated and performed: Yes.       -Biopsy sites made hemostatic with pressure, AgNO3, and/or Monsel's solution   -Satisfactory colposcopy: No.    -Evidence of Invasive cervical CA :  NO  ASSESSMENT:  CINCERE DEPREY is a 25 y.o. [id number] here for  1. HGSIL (high grade squamous intraepithelial lesion) on Pap smear of cervix   .  PLAN: I discussed the grading system of pap smears and HPV  high risk viral types.  We will discuss and base management after colpo results return.     Prentice Docker, MD  [facility] 08/10/2021  3:56 PM

## 2021-08-12 ENCOUNTER — Telehealth: Payer: Self-pay

## 2021-08-12 LAB — SURGICAL PATHOLOGY

## 2021-08-12 NOTE — Telephone Encounter (Signed)
Pt calling; had procedure done Wed c [facility]; since then hasn't really had an appetite to eat or when she does eat she can't eat much and she feels sick afterwards.  Is this supposed to happed after procedure?  What to do?  [phone]  Adv pt not from the procedure; to repeat covid test as well as get tested for the flu.

## 2021-08-15 ENCOUNTER — Telehealth: Payer: Self-pay

## 2021-08-15 ENCOUNTER — Telehealth: Payer: Self-pay | Admitting: Obstetrics and Gynecology

## 2021-08-15 DIAGNOSIS — D069 Carcinoma in situ of cervix, unspecified: Secondary | ICD-10-CM

## 2021-08-15 NOTE — Telephone Encounter (Signed)
Discussed CIN III results with patient. Recommend LEEP procedure, per ASCCP guidelines. DIscussed procedure, including risks and benefits, even those associated with pregnancy, not having the procedure, etc. SHe elects to undergo the LEEP procedure. I discussed with her that if we are not able to schedule the procedure prior to the beginning of December, she may have a different MD performing the procedure.  All questions answered. She would like to proceed.     ICD-10-CM   1. CIN III (cervical intraepithelial neoplasia grade III) with severe dysplasia  D06.9

## 2021-08-15 NOTE — Telephone Encounter (Signed)
Pt calling for explanation of test results or did she have to wait for SDJ.  [phone]  Pt aware SDJ needs to go to results with her and msg will be sent to him.

## 2021-08-22 ENCOUNTER — Telehealth: Payer: Self-pay

## 2021-08-22 NOTE — Telephone Encounter (Signed)
Left a message for the patient to return the call.  

## 2021-08-25 ENCOUNTER — Telehealth: Payer: Self-pay

## 2021-08-25 NOTE — Telephone Encounter (Signed)
Pt calling; is it normal to miss a period after the procedure she had a few weeks ago?  [phone]

## 2021-08-29 NOTE — Telephone Encounter (Signed)
Pt aware by detailed msg. 

## 2021-09-05 NOTE — Telephone Encounter (Signed)
-----   Message from Will Bonnet, MD sent at 08/15/2021  1:37 PM EDT ----- Regarding: Schedule surgery Surgery Booking Request Patient Full Name:  Cindy [name]  MRN: [id number]  DOB: April 08, 1996  Surgeon: Prentice Docker, MD or available MD after 12/2.  Requested Surgery Date and Time: TBD Primary Diagnosis AND Code: CIN III [D06.9] Secondary Diagnosis and Code:  Surgical Procedure: LEEP RNFA Requested?: No L&D Notification: No Admission Status: IN-OFFICE Length of Surgery: 50 min Special Case Needs: No H&P: No Phone Interview???:  No Interpreter: No Medical Clearance:  No Special Scheduling Instructions: IN-OFFICE. See above Any known health/anesthesia issues, diabetes, sleep apnea, latex allergy, defibrillator/pacemaker?: No Acuity: n/a   (P1 highest, P2 delay may cause harm, P3 low, elective gyn, P4 lowest) Post op follow up visits: 4 weeks post-op with a different MD.

## 2021-09-05 NOTE — Telephone Encounter (Signed)
Called patient to schedule LEEP in office w Kendra Opitz 11/29 @ 2:10  Patient is aware you will not be here for her f/u appt.  F/u on 12/28 @ 9:50 w Schuman  Patient wanted to ask about helping her with weight loss. She said that no matter what she tries, she cannot lose weight. She's afraid she may have "the condition that keeps you from loosing weight" and is worried about how it can effect her in the future. I advised that I will forward her question to SDJ.

## 2021-09-07 ENCOUNTER — Ambulatory Visit: Payer: Self-pay

## 2021-09-07 NOTE — Telephone Encounter (Signed)
Pt called in stating that she has migraines but now is getting dizziness from them as well. This has been going on for 2 days and no matter what she does she experiences the dizziness. Movement and standing make it work and if she is having a migraine she will become nauseous and vomit at times. Pt doesn't have a PCP, goes to [facility] and they told her for certain symptoms and diseases they are unable to treat and that she needs to get a provider that can do all of those things. She is concerned about her weight gain as well because she is unsure if she has diabetes or thyroid issues. Advised pt that she can schedule new pt appt if she liked for her area with one of the providers we have but d/t appts being out so far she would need to go to an UC to be seen for the dizziness and migraines pt didn't act like she was ready to make appt today so advised her to call back when she is ready to schedule an appt and in the mean time if she didn't go to UC to call HD and see if they can do labs for DM or thyroid. Care advice given and pt verbalized understanding. No other questions/concerns noted.     Reason for Disposition  [1] MODERATE dizziness (e.g., interferes with normal activities) AND [2] has NOT been evaluated by physician for this  (Exception: dizziness caused by heat exposure, sudden standing, or poor fluid intake)  Answer Assessment - Initial Assessment Questions 1. DESCRIPTION: "Describe your dizziness."     Blurred vision, seeing circles 2. LIGHTHEADED: "Do you feel lightheaded?" (e.g., somewhat faint, woozy, weak upon standing)     no 3. VERTIGO: "Do you feel like either you or the room is spinning or tilting?" (i.e. vertigo)     When standing  4. SEVERITY: "How bad is it?"  "Do you feel like you are going to faint?" "Can you stand and walk?"   - MILD: Feels slightly dizzy, but walking normally.   - MODERATE: Feels unsteady when walking, but not falling; interferes with normal activities (e.g.,  school, work).   - SEVERE: Unable to walk without falling, or requires assistance to walk without falling; feels like passing out now.      moderate 5. ONSET:  "When did the dizziness begin?"     2 days ago 6. AGGRAVATING FACTORS: "Does anything make it worse?" (e.g., standing, change in head position)     Standing and moving around, migraines 7. HEART RATE: "Can you tell me your heart rate?" "How many beats in 15 seconds?"  (Note: not all patients can do this)       NA 8. CAUSE: "What do you think is causing the dizziness?"     Migraine  9. RECURRENT SYMPTOM: "Have you had dizziness before?" If Yes, ask: "When was the last time?" "What happened that time?"     NA 10. OTHER SYMPTOMS: "Do you have any other symptoms?" (e.g., fever, chest pain, vomiting, diarrhea, bleeding)       Vomit when migraine so bad with the dizziness 11. PREGNANCY: "Is there any chance you are pregnant?" "When was your last menstrual period?"       No, ended 11/20  Protocols used: Dizziness - Lightheadedness-A-AH

## 2021-09-12 ENCOUNTER — Telehealth: Payer: Self-pay

## 2021-09-12 NOTE — Telephone Encounter (Signed)
Pt was scheduled for in office LEEP with Glennon Mac on 09/13/21. She had to cancel due to her daughter with the flu. She is aware of SDJ leaving and requested [facility].  I rescheduled her for in clinic LEEP w CRS  DOS 10/19/21 @ 8:30  CRS - let me know if you'd like to see her prior to LEEP.

## 2021-09-12 NOTE — Telephone Encounter (Signed)
-----   Message from Will Bonnet, MD sent at 08/15/2021  1:37 PM EDT ----- Regarding: Schedule surgery Surgery Booking Request Patient Full Name:  Cindy [name]  MRN: [id number]  DOB: 1996-01-27  Surgeon: Prentice Docker, MD or available MD after 12/2.  Requested Surgery Date and Time: TBD Primary Diagnosis AND Code: CIN III [D06.9] Secondary Diagnosis and Code:  Surgical Procedure: LEEP RNFA Requested?: No L&D Notification: No Admission Status: IN-OFFICE Length of Surgery: 50 min Special Case Needs: No H&P: No Phone Interview???:  No Interpreter: No Medical Clearance:  No Special Scheduling Instructions: IN-OFFICE. See above Any known health/anesthesia issues, diabetes, sleep apnea, latex allergy, defibrillator/pacemaker?: No Acuity: n/a   (P1 highest, P2 delay may cause harm, P3 low, elective gyn, P4 lowest) Post op follow up visits: 4 weeks post-op with a different MD.

## 2021-09-13 ENCOUNTER — Ambulatory Visit: Payer: Medicaid Other | Admitting: Obstetrics and Gynecology

## 2021-09-13 NOTE — Telephone Encounter (Signed)
No I can talk to her day of LEEP- thank you

## 2021-10-12 ENCOUNTER — Ambulatory Visit: Payer: Medicaid Other | Admitting: Obstetrics and Gynecology

## 2021-10-16 NOTE — L&D Delivery Note (Signed)
Date of delivery: 06/28/2022 Estimated Date of Delivery: 07/05/22 Patient's last menstrual period was 10/03/2021 (exact date). EGA: [redacted]w[redacted]d Delivery Note At 11:57 PM a viable female was delivered via Vaginal, Spontaneous Presentation: Right Occiput Anterior   APGAR: 8, 9      Weight: 3160 g, 6 pounds 15.5 ounces Placenta status: Spontaneous, Intact. Trailing membranes removed with ring forceps   Cord: 3 vessels with the following complications: None.  Cord pH: NA  Called to see patient.  Mom pushed with good effort to deliver a viable female infant.  The head followed by shoulders, which delivered without difficulty, and the rest of the body.  Shoulder/body cord noted.  Baby to mom's chest.  Cord clamped and cut after 4 min delay.  No cord blood obtained.  Placenta delivered spontaneously, intact, with a 3-vessel cord.   All counts correct.  Hemostasis obtained with IV pitocin and fundal massage.  Anesthesia: Epidural Episiotomy: None Lacerations: None Suture Repair:  NA Est. Blood Loss (mL):  300  Mom to postpartum.  Baby to Couplet care / Skin to Skin.   JRod Can CNM 06/29/2022, 12:33 AM

## 2021-10-19 ENCOUNTER — Ambulatory Visit: Payer: Medicaid Other | Admitting: Obstetrics and Gynecology

## 2021-10-22 ENCOUNTER — Telehealth: Payer: Self-pay | Admitting: Obstetrics and Gynecology

## 2021-10-22 NOTE — Telephone Encounter (Signed)
Attempted to reach the patient to reschedule her LEEP procedure with Dr. Gilman Schmidt. Left message to return call.

## 2021-10-22 NOTE — Telephone Encounter (Signed)
-----   Message from Georgana Curio sent at 10/19/2021 10:36 AM EST ----- Patient woke up with a fever this AM and is requesting to rescheduled. Please advise?

## 2021-10-27 ENCOUNTER — Other Ambulatory Visit: Payer: Self-pay

## 2021-10-27 ENCOUNTER — Emergency Department: Payer: Medicaid Other

## 2021-10-27 ENCOUNTER — Emergency Department
Admission: EM | Admit: 2021-10-27 | Discharge: 2021-10-27 | Disposition: A | Discharge status: Home / Self Care | Payer: Medicaid Other | Attending: Emergency Medicine | Admitting: Emergency Medicine

## 2021-10-27 DIAGNOSIS — R109 Unspecified abdominal pain: Secondary | ICD-10-CM

## 2021-10-27 DIAGNOSIS — O26899 Other specified pregnancy related conditions, unspecified trimester: Secondary | ICD-10-CM | POA: Insufficient documentation

## 2021-10-27 DIAGNOSIS — O3680X0 Pregnancy with inconclusive fetal viability, not applicable or unspecified: Secondary | ICD-10-CM

## 2021-10-27 DIAGNOSIS — R1031 Right lower quadrant pain: Secondary | ICD-10-CM | POA: Diagnosis present

## 2021-10-27 LAB — CBC
HCT: 37.5 % (ref 36.0–46.0)
Hemoglobin: 12.8 g/dL (ref 12.0–15.0)
MCH: 23.5 pg — ABNORMAL LOW (ref 26.0–34.0)
MCHC: 34.1 g/dL (ref 30.0–36.0)
MCV: 68.8 fL — ABNORMAL LOW (ref 80.0–100.0)
Platelets: 338 10*3/uL (ref 150–400)
RBC: 5.45 MIL/uL — ABNORMAL HIGH (ref 3.87–5.11)
RDW: 15.4 % (ref 11.5–15.5)
WBC: 9.9 10*3/uL (ref 4.0–10.5)
nRBC: 0 % (ref 0.0–0.2)

## 2021-10-27 LAB — COMPREHENSIVE METABOLIC PANEL
ALT: 13 U/L (ref 0–44)
AST: 13 U/L — ABNORMAL LOW (ref 15–41)
Albumin: 4.3 g/dL (ref 3.5–5.0)
Alkaline Phosphatase: 51 U/L (ref 38–126)
Anion gap: 8 (ref 5–15)
BUN: 6 mg/dL (ref 6–20)
CO2: 25 mmol/L (ref 22–32)
Calcium: 9.2 mg/dL (ref 8.9–10.3)
Chloride: 104 mmol/L (ref 98–111)
Creatinine, Ser: 0.48 mg/dL (ref 0.44–1.00)
GFR, Estimated: >60 mL/min (ref >60)
Glucose, Bld: 98 mg/dL (ref 70–99)
Potassium: 3.2 mmol/L — ABNORMAL LOW (ref 3.5–5.1)
Sodium: 137 mmol/L (ref 135–145)
Total Bilirubin: 0.7 mg/dL (ref 0.3–1.2)
Total Protein: 8.2 g/dL — ABNORMAL HIGH (ref 6.5–8.1)

## 2021-10-27 LAB — POC URINE PREG, ED: Preg Test, Ur: POSITIVE — AB

## 2021-10-27 LAB — URINALYSIS, ROUTINE W REFLEX MICROSCOPIC
Bacteria, UA: NONE SEEN
Bilirubin Urine: NEGATIVE
Glucose, UA: NEGATIVE mg/dL
Hgb urine dipstick: NEGATIVE
Ketones, ur: 20 mg/dL — AB
Nitrite: NEGATIVE
Protein, ur: NEGATIVE mg/dL
Specific Gravity, Urine: 1.024 (ref 1.005–1.030)
pH: 6 (ref 5.0–8.0)

## 2021-10-27 LAB — LIPASE, BLOOD: Lipase: 25 U/L (ref 11–51)

## 2021-10-27 LAB — HCG, QUANTITATIVE, PREGNANCY: hCG, Beta Chain, Quant, S: 381 m[IU]/mL — ABNORMAL HIGH (ref <5)

## 2021-10-27 NOTE — Discharge Instructions (Signed)
Please follow up with gynecology. Call tomorrow to request a repeat blood draw.  Return to the ER for increase in pain, vaginal bleeding, or other concerns if unable to schedule an appointment

## 2021-10-27 NOTE — ED Triage Notes (Signed)
Pt to ED from UC for c/o RLQ pain that radiates to her back that started Monday and has progressively gotten worse. Pt with positive pregnancy test at [facility] as well.

## 2021-10-27 NOTE — ED Triage Notes (Signed)
Pt comes into the ED via [facility] from UC c/o abd pain that started Monday.  Pt states it has been persistent all week.  Pt had a positive pregnancy test today at [facility] and patient was not aware.  Pt describes the RLQ abd pain as sharp.    156/89 100% RA 87 HR

## 2021-10-27 NOTE — ED Provider Notes (Signed)
[facility] Provider Note    Event Date/Time   First MD Initiated Contact with Patient 10/27/21 1735     (approximate)   History   Abdominal Pain   HPI  Cindy [name] is a 26 y.o. female who presents to the emergency department for treatment and evaluation of right lower abdominal pain that has been intermittent over the past 2 weeks. She went to urgent care and was sent to the ER after pregnancy test was positive. She denies fever, nausea, vomiting, diarrhea. No vaginal bleeding or discharge.    Physical Exam   Triage Vital Signs: ED Triage Vitals  Enc Vitals Group     BP 10/27/21 1636 (!) 148/96     Pulse Rate 10/27/21 1636 91     Resp --      Temp 10/27/21 1636 98 F (36.7 C)     Temp Source 10/27/21 1636 Oral     SpO2 10/27/21 1636 100 %     Weight 10/27/21 1637 230 lb (104.3 kg)     Height 10/27/21 1637 5' (1.524 m)     Head Circumference --      Peak Flow --      Pain Score 10/27/21 1636 8     Pain Loc --      Pain Edu? --      Excl. in [place]? --     Most recent vital signs: Vitals:   10/27/21 1636 10/27/21 1757  BP: (!) 148/96 133/68  Pulse: 91 85  Resp:  18  Temp: 98 F (36.7 C)   SpO2: 100% 100%   General: Awake, no distress.  CV:  Good peripheral perfusion.  Resp:  Normal effort.  Abd:  No distention. Right side lower abdominal pain. Other:    ED Results / Procedures / Treatments   Labs (all labs ordered are listed, but only abnormal results are displayed) Labs Reviewed  COMPREHENSIVE METABOLIC PANEL - Abnormal; Notable for the following components:      Result Value   Potassium 3.2 (*)    Total Protein 8.2 (*)    AST 13 (*)    All other components within normal limits  CBC - Abnormal; Notable for the following components:   RBC 5.45 (*)    MCV 68.8 (*)    MCH 23.5 (*)    All other components within normal limits  URINALYSIS, ROUTINE W REFLEX MICROSCOPIC - Abnormal; Notable for the following components:    Color, Urine YELLOW (*)    APPearance HAZY (*)    Ketones, ur 20 (*)    Leukocytes,Ua TRACE (*)    All other components within normal limits  HCG, QUANTITATIVE, PREGNANCY - Abnormal; Notable for the following components:   hCG, Beta Chain, Quant, S 381 (*)    All other components within normal limits  POC URINE PREG, ED - Abnormal; Notable for the following components:   Preg Test, Ur Positive (*)    All other components within normal limits  LIPASE, BLOOD     EKG  Not indicated.   RADIOLOGY  US OB/TV reviewed by me--no IUP. Radiology reading consistent with the same.  PROCEDURES:  Critical Care performed: No  Procedures   MEDICATIONS ORDERED IN ED: Medications - No data to display   IMPRESSION / MDM / [place] / ED COURSE  I reviewed the triage vital signs and the nursing notes.   Differential diagnosis includes, but is not limited to, ectopic pregnancy, abdominal pain  in pregnancy.  26 year old female presenting to the emergency department for treatment and evaluation of abdominal pain and pregnancy.  See HPI for further details.  CBC and CMP are overall reassuring.  Beta-hCG is 381.  Urinalysis shows trace leukocytes but no bacteria no nitrates no protein.  Patient states that her abdominal pain has gone away at this time.  Awaiting results of the ultrasound.  No IUP identified on ultrasound.  Findings may be related to pregnancy of unknown location, may be too early to visualized, failed pregnancy, or ectopic.  Plan will be to follow-up with OB GYN for serial labs. Strict ER return precautions discussed. For increase in pain, vaginal bleeding, or other conditions of concern she is to return to the ER.    FINAL CLINICAL IMPRESSION(S) / ED DIAGNOSES   Final diagnoses:  Abdominal pain during pregnancy     Rx / DC Orders   ED Discharge Orders     None        Note:  This document was prepared using Dragon voice recognition software and may  include unintentional dictation errors.   Victorino Dike, FNP 10/27/21 1931    Lucrezia Starch, MD 10/27/21 2008

## 2021-10-31 ENCOUNTER — Ambulatory Visit: Payer: Medicaid Other | Admitting: Obstetrics

## 2021-11-01 ENCOUNTER — Ambulatory Visit (INDEPENDENT_AMBULATORY_CARE_PROVIDER_SITE_OTHER): Payer: Medicaid Other | Admitting: Obstetrics

## 2021-11-01 ENCOUNTER — Other Ambulatory Visit: Payer: Self-pay

## 2021-11-01 ENCOUNTER — Encounter: Payer: Self-pay | Admitting: Obstetrics

## 2021-11-01 VITALS — BP 122/70 | Ht 60.0 in | Wt 232.0 lb

## 2021-11-01 DIAGNOSIS — Z3201 Encounter for pregnancy test, result positive: Secondary | ICD-10-CM | POA: Diagnosis not present

## 2021-11-01 NOTE — Progress Notes (Signed)
Obstetrics & Gynecology Office Visit   Chief Complaint:  Chief Complaint  Patient presents with   Follow-up    ER Follow Up    History of Present Illness: Cindy [name] present as f/u from an ED visit. She was seen at [facility] recently for a c/o back pain. A pregnancy test there was positive. Her quantitative value was in the 300s, and she was referred to [facility] for f/u. Today she denies any nausea or vomiting. She denies any pelvic pain or vaginal bleeding. She shares that an ultrasound at the hospital di dnot visualize a pregnancy.   Review of Systems:  Review of Systems  Constitutional: Negative.   HENT: Negative.    Eyes: Negative.   Respiratory: Negative.    Cardiovascular: Negative.   Gastrointestinal: Negative.   Genitourinary: Negative.   Musculoskeletal: Negative.   Skin: Negative.   Neurological: Negative.   Endo/Heme/Allergies: Negative.   Psychiatric/Behavioral: Negative.      Past Medical History:  Past Medical History:  Diagnosis Date   Anemia    Depression    is past hx but not active now per pt   Morbidly obese ([place])    notes from health dept as hx    Past Surgical History:  Past Surgical History:  Procedure Laterality Date   COLONOSCOPY WITH PROPOFOL N/A 06/15/2017   Procedure: COLONOSCOPY WITH PROPOFOL;  Surgeon: [name], MD;  Location: [facility] ENDOSCOPY;  Service: Gastroenterology;  Laterality: N/A;   ESOPHAGOGASTRODUODENOSCOPY (EGD) WITH PROPOFOL N/A 06/15/2017   Procedure: ESOPHAGOGASTRODUODENOSCOPY (EGD) WITH PROPOFOL;  Surgeon: [name], MD;  Location: [facility] ENDOSCOPY;  Service: Gastroenterology;  Laterality: N/A;   TONSILLECTOMY      Gynecologic History: Patient's last menstrual period was 10/03/2021 (exact date).  Obstetric History: [id number]  Family History:  Family History  Problem Relation Age of Onset   Thyroid disease Mother    Hypertension Mother    Sickle cell trait Mother    Sickle cell anemia Sister    Hypertension Maternal  Grandmother     Social History:  Social History   Socioeconomic History   Marital status: Significant Other    Spouse name: Building surveyor   Number of children: 1   Years of education: Not on file   Highest education level: 9th grade  Occupational History   Not on file  Tobacco Use   Smoking status: Never   Smokeless tobacco: Never  Vaping Use   Vaping Use: Never used  Substance and Sexual Activity   Alcohol use: No    Comment: former   Drug use: Not Currently    Types: Marijuana    Comment: last used in sept   Sexual activity: Not Currently    Partners: Male    Birth control/protection: None  Other Topics Concern   Not on file  Social History Narrative   Not on file   Social Determinants of Health   Financial Resource Strain: Not on file  Food Insecurity: Not on file  Transportation Needs: Not on file  Physical Activity: Not on file  Stress: Not on file  Social Connections: Not on file  Intimate Partner Violence: Not on file    Allergies:  No Known Allergies  Medications: Prior to Admission medications   Medication Sig Start Date End Date Taking? Authorizing Provider  cephALEXin (KEFLEX) 500 MG capsule Take 1 capsule (500 mg total) by mouth 4 (four) times daily. Patient not taking: Reported on 07/06/2021 06/26/21   Regan Lemming, MD  Multiple Vitamins-Minerals (MULTIVITAMIN)  tablet Take 1 tablet by mouth daily. Patient not taking: Reported on 07/06/2021 07/17/19   Caren Macadam, MD  norgestimate-ethinyl estradiol (ORTHO-CYCLEN) 0.25-35 MG-MCG tablet Take 1 tablet by mouth daily. Patient not taking: Reported on 07/06/2021 09/29/20   Lezlie Lye, NP    Physical Exam Vitals:  Vitals:   11/01/21 1048  BP: 122/70   Patient's last menstrual period was 10/03/2021 (exact date).  Physical Exam Vitals reviewed.  Constitutional:      Appearance: Normal appearance. She is obese.  Cardiovascular:     Rate and Rhythm: Normal rate and regular  rhythm.     Pulses: Normal pulses.     Heart sounds: Normal heart sounds.  Pulmonary:     Effort: Pulmonary effort is normal.     Breath sounds: Normal breath sounds.  Skin:    General: Skin is warm and dry.  Neurological:     Mental Status: She is alert.  Psychiatric:        Behavior: Behavior normal.     Assessment: 26 y.o. [id number] with positive urine and quantitative pregnancy tests for confirmation of pregnancy. Follow up from ED visit. Documented HGSIL pap with missed LEEP appointment.    Plan: Problem List Items Addressed This Visit       Other   Positive pregnancy test - Primary   Relevant Orders   hCG, quantitative, pregnancy  Discussed the lab today and possibility of repeat ultrasound based on the results. Longterm plan would be to address contraception and set up prenatal appts.  Imagene Riches, CNM  11/01/2021 11:26 AM

## 2021-11-02 ENCOUNTER — Telehealth: Payer: Self-pay | Admitting: Obstetrics

## 2021-11-02 LAB — BETA HCG QUANT (REF LAB): hCG Quant: 1690 m[IU]/mL

## 2021-11-02 NOTE — Telephone Encounter (Signed)
Pt wants results explained to help her understand better of what is going on.  The ED doc thought maybe the pain she is having is related to the preg.

## 2021-11-02 NOTE — Telephone Encounter (Signed)
You saw this patient yesterday (1/17).  She had additional questions for you.  She would like for you to give her a call back when you get a chance.  I suggested a MyChart message, but she would like to speak with you instead.

## 2021-11-07 ENCOUNTER — Encounter: Payer: Self-pay | Admitting: Obstetrics

## 2021-11-07 ENCOUNTER — Telehealth: Payer: Self-pay | Admitting: Obstetrics

## 2021-11-07 ENCOUNTER — Other Ambulatory Visit: Payer: Self-pay | Admitting: Obstetrics

## 2021-11-07 DIAGNOSIS — Z3201 Encounter for pregnancy test, result positive: Secondary | ICD-10-CM

## 2021-11-07 NOTE — Progress Notes (Signed)
Several attempts made to reach the patient and inform her I have ordered her a f/u ultrasound. Will pass this to the schedulers and order the scan.  Imagene Riches, CNM  11/07/2021 5:51 PM

## 2021-11-07 NOTE — Telephone Encounter (Signed)
Called pt to let her know that we need to schedule an [place].  Could not reach the pt.  Phone would immediately give a busy signal.  I did speak with the grandmother Chelsie Burel) who was going to try to get in touch with her.

## 2021-11-08 ENCOUNTER — Ambulatory Visit
Admission: RE | Admit: 2021-11-08 | Discharge: 2021-11-08 | Disposition: A | Discharge status: Home / Self Care | Payer: Medicaid Other | Source: Ambulatory Visit | Attending: Obstetrics | Admitting: Obstetrics

## 2021-11-08 ENCOUNTER — Other Ambulatory Visit: Payer: Self-pay | Admitting: Obstetrics

## 2021-11-08 ENCOUNTER — Other Ambulatory Visit: Payer: Self-pay

## 2021-11-08 DIAGNOSIS — Z3491 Encounter for supervision of normal pregnancy, unspecified, first trimester: Secondary | ICD-10-CM

## 2021-11-08 DIAGNOSIS — Z3A01 Less than 8 weeks gestation of pregnancy: Secondary | ICD-10-CM | POA: Insufficient documentation

## 2021-11-08 DIAGNOSIS — O3680X0 Pregnancy with inconclusive fetal viability, not applicable or unspecified: Secondary | ICD-10-CM

## 2021-11-08 DIAGNOSIS — N8312 Corpus luteum cyst of left ovary: Secondary | ICD-10-CM | POA: Insufficient documentation

## 2021-11-08 DIAGNOSIS — Z3201 Encounter for pregnancy test, result positive: Secondary | ICD-10-CM

## 2021-11-08 DIAGNOSIS — O3481 Maternal care for other abnormalities of pelvic organs, first trimester: Secondary | ICD-10-CM | POA: Insufficient documentation

## 2021-11-08 DIAGNOSIS — Z3687 Encounter for antenatal screening for uncertain dates: Secondary | ICD-10-CM | POA: Diagnosis present

## 2021-11-08 DIAGNOSIS — O3680X1 Pregnancy with inconclusive fetal viability, fetus 1: Secondary | ICD-10-CM

## 2021-11-09 ENCOUNTER — Telehealth: Payer: Self-pay

## 2021-11-09 NOTE — Telephone Encounter (Signed)
Pt returning call about u/s she had done yesterday.  [phone]

## 2021-11-09 NOTE — Progress Notes (Signed)
Patient had repeat ultrasound on 1/25 that shows a gestational sac and FHTs. EDD 07/05/2022, 5 weeks 6 days gestation.will update the dating at her NOB visit.now that IUP is confirmed. Imagene Riches, CNM  11/09/2021 12:11 PM

## 2021-11-10 NOTE — Telephone Encounter (Signed)
I spoke to [facility] regarding the rescheduling of patient's LEEP. I advised that pt is currently pregnant. [facility] postpones having the LEEP during pregnancy. Her orders are as follows:  "Colposcopy every 12 weeks in preg for monitoring."  Next appt 12/01/21 with MMF

## 2021-11-10 NOTE — Telephone Encounter (Signed)
Patient called about her ultrasound, done two days ago. It shows a 5 week 2 day gestation and there was alsoa corpus  luteal cyst noted by her left ovary. I called and left her a VM about the sono report, and encouraged her to call and set [place] appointment with [place]. Imagene Riches, CNM  11/10/2021 10:25 AM

## 2021-11-14 ENCOUNTER — Telehealth: Payer: Self-pay

## 2021-11-14 NOTE — Telephone Encounter (Signed)
Pt calling; is 7wks; has discomfort top of stomach; nausea feeling; no throwing up; anything she does is very uncomfortable; can't sleep much at nght; appt 12/01/21; anything she can do or does she need to be seen?  [phone] Adv ginger ale, ginger drops; sea bands, nausea suckers; take gummy pnv with food.

## 2021-11-14 NOTE — Telephone Encounter (Signed)
I contacted patient via phone. Left vociemail for patient to call back to confirm ultrasound add on to her scheduled appointment on 12/01/21 with MMF

## 2021-11-26 ENCOUNTER — Other Ambulatory Visit: Payer: Self-pay

## 2021-11-26 ENCOUNTER — Emergency Department
Admission: EM | Admit: 2021-11-26 | Discharge: 2021-11-26 | Disposition: A | Discharge status: Home / Self Care | Payer: Medicaid Other | Attending: Emergency Medicine | Admitting: Emergency Medicine

## 2021-11-26 DIAGNOSIS — O210 Mild hyperemesis gravidarum: Secondary | ICD-10-CM | POA: Insufficient documentation

## 2021-11-26 DIAGNOSIS — Z3A08 8 weeks gestation of pregnancy: Secondary | ICD-10-CM | POA: Insufficient documentation

## 2021-11-26 DIAGNOSIS — O219 Vomiting of pregnancy, unspecified: Secondary | ICD-10-CM | POA: Diagnosis present

## 2021-11-26 DIAGNOSIS — R112 Nausea with vomiting, unspecified: Secondary | ICD-10-CM

## 2021-11-26 LAB — COMPREHENSIVE METABOLIC PANEL
ALT: 16 U/L (ref 0–44)
AST: 13 U/L — ABNORMAL LOW (ref 15–41)
Albumin: 4 g/dL (ref 3.5–5.0)
Alkaline Phosphatase: 53 U/L (ref 38–126)
Anion gap: 7 (ref 5–15)
BUN: 9 mg/dL (ref 6–20)
CO2: 23 mmol/L (ref 22–32)
Calcium: 8.7 mg/dL — ABNORMAL LOW (ref 8.9–10.3)
Chloride: 104 mmol/L (ref 98–111)
Creatinine, Ser: 0.49 mg/dL (ref 0.44–1.00)
GFR, Estimated: >60 mL/min (ref >60)
Glucose, Bld: 85 mg/dL (ref 70–99)
Potassium: 3.5 mmol/L (ref 3.5–5.1)
Sodium: 134 mmol/L — ABNORMAL LOW (ref 135–145)
Total Bilirubin: 0.4 mg/dL (ref 0.3–1.2)
Total Protein: 7.9 g/dL (ref 6.5–8.1)

## 2021-11-26 LAB — URINALYSIS, COMPLETE (UACMP) WITH MICROSCOPIC
Bacteria, UA: NONE SEEN
Bilirubin Urine: NEGATIVE
Glucose, UA: NEGATIVE mg/dL
Hgb urine dipstick: NEGATIVE
Ketones, ur: NEGATIVE mg/dL
Leukocytes,Ua: NEGATIVE
Nitrite: NEGATIVE
Protein, ur: NEGATIVE mg/dL
Specific Gravity, Urine: 1.027 (ref 1.005–1.030)
pH: 6 (ref 5.0–8.0)

## 2021-11-26 LAB — CBC WITH DIFFERENTIAL/PLATELET
Abs Immature Granulocytes: 0.02 10*3/uL (ref 0.00–0.07)
Basophils Absolute: 0 10*3/uL (ref 0.0–0.1)
Basophils Relative: 0 %
Eosinophils Absolute: 0.2 10*3/uL (ref 0.0–0.5)
Eosinophils Relative: 2 %
HCT: 37.9 % (ref 36.0–46.0)
Hemoglobin: 12.8 g/dL (ref 12.0–15.0)
Immature Granulocytes: 0 %
Lymphocytes Relative: 33 %
Lymphs Abs: 3.3 10*3/uL (ref 0.7–4.0)
MCH: 23.7 pg — ABNORMAL LOW (ref 26.0–34.0)
MCHC: 33.8 g/dL (ref 30.0–36.0)
MCV: 70.2 fL — ABNORMAL LOW (ref 80.0–100.0)
Monocytes Absolute: 0.9 10*3/uL (ref 0.1–1.0)
Monocytes Relative: 9 %
Neutro Abs: 5.7 10*3/uL (ref 1.7–7.7)
Neutrophils Relative %: 56 %
Platelets: 332 10*3/uL (ref 150–400)
RBC: 5.4 MIL/uL — ABNORMAL HIGH (ref 3.87–5.11)
RDW: 15.5 % (ref 11.5–15.5)
WBC: 10.2 10*3/uL (ref 4.0–10.5)
nRBC: 0 % (ref 0.0–0.2)

## 2021-11-26 MED ORDER — ONDANSETRON 4 MG PO TBDP
4.0000 mg | ORAL_TABLET | Freq: Three times a day (TID) | ORAL | 0 refills | Status: AC | PRN
Start: 2021-11-26 — End: 2021-12-01

## 2021-11-26 MED ORDER — ONDANSETRON HCL 4 MG/2ML IJ SOLN
4.0000 mg | Freq: Once | INTRAMUSCULAR | Status: AC
  Administered 2021-11-26: 4 mg via INTRAVENOUS
  Filled 2021-11-26: qty 2

## 2021-11-26 MED ORDER — SODIUM CHLORIDE 0.9 % IV BOLUS
1000.0000 mL | Freq: Once | INTRAVENOUS | Status: AC
  Administered 2021-11-26: 1000 mL via INTRAVENOUS

## 2021-11-26 NOTE — ED Provider Notes (Signed)
[facility] Provider Note  Patient Contact: 4:49 PM (approximate)   History   Emesis During Pregnancy   HPI  Cindy [name] is a 26 y.o. female currently [redacted] weeks pregnant presents to the emergency department with daily vomiting.  Patient states that she is concerned that she cannot keep her prenatal vitamins down.  She denies vaginal bleeding or pelvic pain.  No low back pain.  No chest pain, chest tightness or shortness of breath.      Physical Exam   Triage Vital Signs: ED Triage Vitals [11/26/21 1546]  Enc Vitals Group     BP (!) 141/85     Pulse Rate 70     Resp 17     Temp 98.7 F (37.1 C)     Temp Source Oral     SpO2 100 %     Weight 230 lb (104.3 kg)     Height 5' (1.524 m)     Head Circumference      Peak Flow      Pain Score 0     Pain Loc      Pain Edu?      Excl. in [place]?     Most recent vital signs: Vitals:   11/26/21 1546  BP: (!) 141/85  Pulse: 70  Resp: 17  Temp: 98.7 F (37.1 C)  SpO2: 100%     General: Alert and in no acute distress. Eyes:  PERRL. EOMI. Head: No acute traumatic findings ENT:      Ears:       Nose: No congestion/rhinnorhea.      Mouth/Throat: Mucous membranes are moist. Neck: No stridor. No cervical spine tenderness to palpation.  Cardiovascular:  Good peripheral perfusion Respiratory: Normal respiratory effort without tachypnea or retractions. Lungs CTAB. Good air entry to the bases with no decreased or absent breath sounds. Gastrointestinal: Bowel sounds 4 quadrants. Soft and nontender to palpation. No guarding or rigidity. No palpable masses. No distention. No CVA tenderness. Musculoskeletal: Full range of motion to all extremities.  Neurologic:  No gross focal neurologic deficits are appreciated.  Skin:   No rash noted Other:   ED Results / Procedures / Treatments   Labs (all labs ordered are listed, but only abnormal results are displayed) Labs Reviewed  COMPREHENSIVE METABOLIC  PANEL - Abnormal; Notable for the following components:      Result Value   Sodium 134 (*)    Calcium 8.7 (*)    AST 13 (*)    All other components within normal limits  URINALYSIS, COMPLETE (UACMP) WITH MICROSCOPIC - Abnormal; Notable for the following components:   Color, Urine YELLOW (*)    APPearance CLEAR (*)    All other components within normal limits  CBC WITH DIFFERENTIAL/PLATELET - Abnormal; Notable for the following components:   RBC 5.40 (*)    MCV 70.2 (*)    MCH 23.7 (*)    All other components within normal limits     RADIOLOGY  I personally viewed and evaluated these images as part of my medical decision making, as well as reviewing the written report by the radiologist.  ED Provider Interpretation:    PROCEDURES:  Critical Care performed: No  Procedures   MEDICATIONS ORDERED IN ED: Medications  sodium chloride 0.9 % bolus 1,000 mL (1,000 mLs Intravenous New Bag/Given 11/26/21 1706)  ondansetron (ZOFRAN) injection 4 mg (4 mg Intravenous Given 11/26/21 1707)     IMPRESSION / MDM / [place] /  ED COURSE  I reviewed the triage vital signs and the nursing notes.                              Differential diagnosis includes, but is not limited to, UTI, hyperemesis in pregnancy...  Assessment and plan Hyperemesis in pregnancy 26 year old female presents to the emergency department with emesis for the past several days after taking her prenatal vitamins.  Vital signs are reassuring at triage.  On physical exam, patient was alert, active and nontoxic-appearing  CBC and CMP were reassuring.  Urinalysis shows no signs of UTI.  Patient felt significantly improved after a liter of normal saline and oral Zofran.  Patient was discharged with ODT Zofran.   FINAL CLINICAL IMPRESSION(S) / ED DIAGNOSES   Final diagnoses:  Nausea and vomiting, unspecified vomiting type     Rx / DC Orders   ED Discharge Orders          Ordered    ondansetron  (ZOFRAN-ODT) 4 MG disintegrating tablet  Every 8 hours PRN        11/26/21 1923             Note:  This document was prepared using Dragon voice recognition software and may include unintentional dictation errors.   Vallarie Mare Linwood, PA-C 11/26/21 1932    Blake Divine, MD 11/26/21 1958

## 2021-11-26 NOTE — Discharge Instructions (Addendum)
You can take Zofran up to three times daily for the next five days.

## 2021-11-26 NOTE — ED Triage Notes (Signed)
Patient is currently [redacted] weeks pregnant EDD 9/20 and c/o emesis during pregnancy. Denies other complaints.

## 2021-12-01 ENCOUNTER — Ambulatory Visit: Payer: Medicaid Other | Admitting: Obstetrics

## 2021-12-01 ENCOUNTER — Telehealth: Payer: Self-pay

## 2021-12-01 ENCOUNTER — Other Ambulatory Visit: Payer: Medicaid Other

## 2021-12-01 NOTE — Telephone Encounter (Signed)
Pt left msg on triage asking for a call back regarding her zofran Rx. Called pt back and she wants to know if she can take her zofran right after taking her prenatals. Advised pt that she can take after her prenatal vitamin, but it would be best for her to take it in the morning and as needed afterwards.

## 2021-12-06 ENCOUNTER — Other Ambulatory Visit (HOSPITAL_COMMUNITY)
Admission: RE | Admit: 2021-12-06 | Discharge: 2021-12-06 | Disposition: A | Discharge status: Home / Self Care | Payer: Medicaid Other | Source: Ambulatory Visit | Attending: Obstetrics | Admitting: Obstetrics

## 2021-12-06 ENCOUNTER — Encounter: Payer: Self-pay | Admitting: Obstetrics

## 2021-12-06 ENCOUNTER — Ambulatory Visit (INDEPENDENT_AMBULATORY_CARE_PROVIDER_SITE_OTHER): Payer: Medicaid Other | Admitting: Obstetrics

## 2021-12-06 ENCOUNTER — Other Ambulatory Visit: Payer: Self-pay

## 2021-12-06 VITALS — BP 120/60 | Wt 235.0 lb

## 2021-12-06 DIAGNOSIS — Z3480 Encounter for supervision of other normal pregnancy, unspecified trimester: Secondary | ICD-10-CM

## 2021-12-06 DIAGNOSIS — O0990 Supervision of high risk pregnancy, unspecified, unspecified trimester: Secondary | ICD-10-CM | POA: Diagnosis not present

## 2021-12-06 DIAGNOSIS — Z113 Encounter for screening for infections with a predominantly sexual mode of transmission: Secondary | ICD-10-CM | POA: Insufficient documentation

## 2021-12-06 DIAGNOSIS — Z3490 Encounter for supervision of normal pregnancy, unspecified, unspecified trimester: Secondary | ICD-10-CM | POA: Insufficient documentation

## 2021-12-06 DIAGNOSIS — Z3A09 9 weeks gestation of pregnancy: Secondary | ICD-10-CM | POA: Diagnosis not present

## 2021-12-06 DIAGNOSIS — O99210 Obesity complicating pregnancy, unspecified trimester: Secondary | ICD-10-CM | POA: Diagnosis not present

## 2021-12-06 LAB — POCT URINALYSIS DIPSTICK OB
Glucose, UA: NEGATIVE
POC,PROTEIN,UA: NEGATIVE

## 2021-12-06 NOTE — Progress Notes (Signed)
New Obstetric Patient H&P    Chief Complaint: "Desires prenatal care"   History of Present Illness: Patient is a 26 y.o. [id number] Not Hispanic or Latino female, LMP unknown presents with amenorrhea and positive home pregnancy test. Based on several hospital ultrasounds, her EDD is Estimated Date of Delivery: 07/10/22 and her EGA is [redacted]w[redacted]d. Cycles are 7. days, regular, and occur approximately every : 30 days. Her last pap smear was about 1 years ago and was high-grade squamous intraepithelial neoplasia  (HGSIL-encompassing moderate and severe dysplasia).    She had a urine pregnancy test which was positive about 5 week(s)  ago. Her last menstrual period was normal and lasted for  about 6 day(s). Since her LMP she claims she has experienced fatigue, nausea and vomiting.. She denies vaginal bleeding. Her past medical history is noncontributory. Her prior pregnancies are notable for two SVDs.  Since her LMP, she admits to the use of tobacco products  no She claims she has gained   no pounds since the start of her pregnancy.  There are cats in the home in the home  no  She admits close contact with children on a regular basis  yes  She has had chicken pox in the past no She has had Tuberculosis exposures, symptoms, or previously tested positive for TB   no Current or past history of domestic violence. no  Genetic Screening/Teratology Counseling: (Includes patient, baby's father, or anyone in either family with:)   60. Patient's age >/= 60 at [facility]  no 2. Thalassemia ([place], [place], [place], or Asian background): MCV<80  no 3. Neural tube defect (meningomyelocele, spina bifida, anencephaly)  no 4. Congenital heart defect  no  5. Down syndrome  no 6. Tay-Sachs (Jewish, [place])  no 7. Canavan's Disease  no 8. Sickle cell disease or trait (African)  no  9. Hemophilia or other blood disorders  no  10. Muscular dystrophy  no  11. Cystic fibrosis  no  12. Huntington's Chorea   no  13. Mental retardation/autism  no 14. Other inherited genetic or chromosomal disorder  no 15. Maternal metabolic disorder (DM, PKU, etc)  no 16. Patient or FOB with a child with a birth defect not listed above no  16a. Patient or FOB with a birth defect themselves no 17. Recurrent pregnancy loss, or stillbirth  no  18. Any medications since LMP other than prenatal vitamins (include vitamins, supplements, OTC meds, drugs, alcohol)  no 19. Any other genetic/environmental exposure to discuss  no  Infection History:   1. Lives with someone with TB or TB exposed  no  2. Patient or partner has history of genital herpes  no 3. Rash or viral illness since LMP  no 4. History of STI (GC, CT, HPV, syphilis, HIV)  no 5. History of recent travel :  no  Other pertinent information:  She had an HGSIL pap smear last year with no follow up. Pap redone today.     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    Past Surgical History:  Past Surgical History:  Procedure Laterality Date   COLONOSCOPY WITH PROPOFOL N/A 06/15/2017  Procedure: COLONOSCOPY WITH PROPOFOL;  Surgeon: [name], MD;  Location: [facility] ENDOSCOPY;  Service: Gastroenterology;  Laterality: N/A;   ESOPHAGOGASTRODUODENOSCOPY (EGD) WITH PROPOFOL N/A 06/15/2017   Procedure: ESOPHAGOGASTRODUODENOSCOPY (EGD) WITH PROPOFOL;  Surgeon: [name], MD;  Location: [facility] ENDOSCOPY;  Service: Gastroenterology;  Laterality: N/A;   TONSILLECTOMY      Gynecologic History: Patient's last menstrual period was 10/03/2021 (exact date).  Obstetric History: [id number]  Family History:  Family History  Problem Relation Age of Onset   Thyroid disease Mother    Hypertension Mother     Sickle cell trait Mother    Sickle cell anemia Sister    Hypertension Maternal Grandmother     Social History:  Social History   Socioeconomic History   Marital status: Significant Other    Spouse name: Building surveyor   Number of children: 1   Years of education: Not on file   Highest education level: 9th grade  Occupational History   Not on file  Tobacco Use   Smoking status: Never   Smokeless tobacco: Never  Vaping Use   Vaping Use: Never used  Substance and Sexual Activity   Alcohol use: No    Comment: former   Drug use: Not Currently    Types: Marijuana    Comment: last used in sept   Sexual activity: Not Currently    Partners: Male    Birth control/protection: None  Other Topics Concern   Not on file  Social History Narrative   Not on file   Social Determinants of Health   Financial Resource Strain: Not on file  Food Insecurity: Not on file  Transportation Needs: Not on file  Physical Activity: Not on file  Stress: Not on file  Social Connections: Not on file  Intimate Partner Violence: Not on file    Allergies:  No Known Allergies  Medications: Prior to Admission medications   Medication Sig Start Date End Date Taking? Authorizing Provider  ondansetron (ZOFRAN) 4 MG tablet Take 4 mg by mouth every 8 (eight) hours as needed for nausea or vomiting.   Yes [provider]  Prenatal Vit-Fe Fumarate-FA (PRENATAL MULTIVITAMIN) TABS tablet Take 1 tablet by mouth daily at 12 noon.   Yes [provider]    Physical Exam Vitals: Blood pressure 120/60, weight 235 lb (106.6 kg), last menstrual period 10/03/2021, unknown if currently breastfeeding.  General: NAD HEENT: normocephalic, anicteric Thyroid: no enlargement, no palpable nodules Pulmonary: No increased work of breathing, CTAB Cardiovascular: RRR, distal pulses 2+ Abdomen: NABS, soft, non-tender, non-distended.  Umbilicus without lesions.  No hepatomegaly,  splenomegaly or masses palpable. No evidence of hernia  Genitourinary:  External: Normal external female genitalia.  Normal urethral meatus, normal  Bartholin's and Skene's glands.    Vagina: Normal vaginal mucosa, no evidence of prolapse.    Cervix: Grossly normal in appearance, no bleeding  Uterus: anteverted, -enlarged, mobile, normal contour.  No CMT  Adnexa: ovaries non-enlarged, no adnexal masses  Rectal: deferred Extremities: no edema, erythema, or tenderness Neurologic: Grossly intact Psychiatric: mood appropriate, affect full   Assessment: 26 y.o. [id number] at [redacted]w[redacted]d presenting to initiate prenatal care  Plan: 1) Avoid alcoholic beverages. 2) Patient encouraged not to smoke.  3) Discontinue the use of all non-medicinal drugs and chemicals.  4) Take prenatal vitamins daily.  5) Nutrition, food safety (fish, cheese advisories, and high nitrite foods) and exercise discussed. 6) Hospital and practice style discussed with cross coverage system.  7) Genetic Screening, such as with 1st  Trimester Screening, cell free fetal DNA, AFP testing, and Ultrasound, as well as with amniocentesis and CVS as appropriate, is discussed with patient. At the conclusion of today's visit patient requested genetic testing 8) Patient is asked about travel to areas at risk for the Zika virus, and counseled to avoid travel and exposure to mosquitoes or sexual partners who may have themselves been exposed to the virus. Testing is discussed, and will be ordered as appropriate.  As her BMI is 45, will order early 1hr GTT testing for next visit. She will need anethesia consultation, and growth scans. Needs additional education on daily ASA, limited weight gain. She id not breastfeed her other children and believes she could not produce. Will continue to discuss. Anatomy scan ordered for 18 weeks. She desires the MaternT test for next time

## 2021-12-07 LAB — URINE DRUG PANEL 7
Amphetamines, Urine: NEGATIVE ng/mL
Barbiturate Quant, Ur: NEGATIVE ng/mL
Benzodiazepine Quant, Ur: NEGATIVE ng/mL
Cannabinoid Quant, Ur: NEGATIVE ng/mL
Cocaine (Metab.): NEGATIVE ng/mL
Opiate Quant, Ur: NEGATIVE ng/mL
PCP Quant, Ur: NEGATIVE ng/mL

## 2021-12-07 NOTE — Addendum Note (Signed)
Addended by: Meryl Dare on: 12/07/2021 09:03 AM   Modules accepted: Orders

## 2021-12-07 NOTE — Addendum Note (Signed)
Addended by: Meryl Dare on: 12/07/2021 09:00 AM   Modules accepted: Orders

## 2021-12-07 NOTE — Addendum Note (Signed)
Addended by: Imagene Riches on: 12/07/2021 11:56 AM   Modules accepted: Orders

## 2021-12-08 LAB — CERVICOVAGINAL ANCILLARY ONLY
Bacterial Vaginitis (gardnerella): NEGATIVE
Chlamydia: NEGATIVE
Comment: NEGATIVE
Comment: NEGATIVE
Comment: NEGATIVE
Comment: NORMAL
Neisseria Gonorrhea: NEGATIVE
Trichomonas: NEGATIVE

## 2021-12-09 LAB — URINE CULTURE

## 2021-12-14 LAB — CYTOLOGY - PAP
Comment: NEGATIVE
Diagnosis: NEGATIVE
Diagnosis: REACTIVE
High risk HPV: NEGATIVE

## 2021-12-16 ENCOUNTER — Emergency Department
Admission: EM | Admit: 2021-12-16 | Discharge: 2021-12-16 | Disposition: A | Discharge status: Home / Self Care | Payer: Medicaid Other | Attending: Student in an Organized Health Care Education/Training Program | Admitting: Student in an Organized Health Care Education/Training Program

## 2021-12-16 ENCOUNTER — Encounter: Payer: Self-pay | Admitting: Emergency Medicine

## 2021-12-16 ENCOUNTER — Other Ambulatory Visit: Payer: Self-pay

## 2021-12-16 ENCOUNTER — Emergency Department: Payer: Medicaid Other

## 2021-12-16 DIAGNOSIS — N9489 Other specified conditions associated with female genital organs and menstrual cycle: Secondary | ICD-10-CM | POA: Insufficient documentation

## 2021-12-16 DIAGNOSIS — O418X10 Other specified disorders of amniotic fluid and membranes, first trimester, not applicable or unspecified: Secondary | ICD-10-CM

## 2021-12-16 DIAGNOSIS — O468X1 Other antepartum hemorrhage, first trimester: Secondary | ICD-10-CM | POA: Insufficient documentation

## 2021-12-16 DIAGNOSIS — O468X3 Other antepartum hemorrhage, third trimester: Secondary | ICD-10-CM | POA: Diagnosis present

## 2021-12-16 DIAGNOSIS — Z3A11 11 weeks gestation of pregnancy: Secondary | ICD-10-CM | POA: Diagnosis not present

## 2021-12-16 DIAGNOSIS — O209 Hemorrhage in early pregnancy, unspecified: Secondary | ICD-10-CM

## 2021-12-16 LAB — BASIC METABOLIC PANEL
Anion gap: 10 (ref 5–15)
BUN: 11 mg/dL (ref 6–20)
CO2: 24 mmol/L (ref 22–32)
Calcium: 9.3 mg/dL (ref 8.9–10.3)
Chloride: 102 mmol/L (ref 98–111)
Creatinine, Ser: 0.54 mg/dL (ref 0.44–1.00)
GFR, Estimated: >60 mL/min (ref >60)
Glucose, Bld: 99 mg/dL (ref 70–99)
Potassium: 3.6 mmol/L (ref 3.5–5.1)
Sodium: 136 mmol/L (ref 135–145)

## 2021-12-16 LAB — CBC WITH DIFFERENTIAL/PLATELET
Abs Immature Granulocytes: 0.04 10*3/uL (ref 0.00–0.07)
Basophils Absolute: 0 10*3/uL (ref 0.0–0.1)
Basophils Relative: 0 %
Eosinophils Absolute: 0.2 10*3/uL (ref 0.0–0.5)
Eosinophils Relative: 2 %
HCT: 37.6 % (ref 36.0–46.0)
Hemoglobin: 12.9 g/dL (ref 12.0–15.0)
Immature Granulocytes: 0 %
Lymphocytes Relative: 30 %
Lymphs Abs: 2.8 10*3/uL (ref 0.7–4.0)
MCH: 23.7 pg — ABNORMAL LOW (ref 26.0–34.0)
MCHC: 34.3 g/dL (ref 30.0–36.0)
MCV: 69 fL — ABNORMAL LOW (ref 80.0–100.0)
Monocytes Absolute: 0.5 10*3/uL (ref 0.1–1.0)
Monocytes Relative: 5 %
Neutro Abs: 5.8 10*3/uL (ref 1.7–7.7)
Neutrophils Relative %: 63 %
Platelets: 317 10*3/uL (ref 150–400)
RBC: 5.45 MIL/uL — ABNORMAL HIGH (ref 3.87–5.11)
RDW: 15.5 % (ref 11.5–15.5)
Smear Review: NORMAL
WBC Morphology: REACTIVE
WBC: 9.3 10*3/uL (ref 4.0–10.5)
nRBC: 0 % (ref 0.0–0.2)

## 2021-12-16 LAB — URINALYSIS, ROUTINE W REFLEX MICROSCOPIC
Bilirubin Urine: NEGATIVE
Glucose, UA: NEGATIVE mg/dL
Hgb urine dipstick: NEGATIVE
Ketones, ur: NEGATIVE mg/dL
Leukocytes,Ua: NEGATIVE
Nitrite: NEGATIVE
Protein, ur: NEGATIVE mg/dL
Specific Gravity, Urine: 1.027 (ref 1.005–1.030)
pH: 7 (ref 5.0–8.0)

## 2021-12-16 LAB — ABO/RH: ABO/RH(D): AB POS

## 2021-12-16 LAB — HCG, QUANTITATIVE, PREGNANCY: hCG, Beta Chain, Quant, S: 131036 m[IU]/mL — ABNORMAL HIGH (ref <5)

## 2021-12-16 MED ORDER — TRAMADOL HCL 50 MG PO TABS
50.0000 mg | ORAL_TABLET | Freq: Once | ORAL | Status: AC
  Administered 2021-12-16: 50 mg via ORAL
  Filled 2021-12-16: qty 1

## 2021-12-16 NOTE — ED Provider Triage Note (Signed)
Emergency Medicine Provider Triage Evaluation Note ? ?Cindy [name] , a 26 y.o. female  was evaluated in triage.  Pt complains of vaginal bleeding and mild lower pelvic cramping today. She is about [redacted] weeks pregnant. Bleeding stopped around 10am, but cramping has continued. G3P2. ? ?Review of Systems  ?Positive: Pelvic cramping, vaginal bleeding ?Negative: Fever, dysuria ? ?Physical Exam  ?BP 119/69   Pulse 85   Temp 98.7 ?F (37.1 ?C) (Oral)   Resp 16   Ht 5' (1.524 m)   Wt 106.5 kg   LMP 10/03/2021 (Exact Date)   SpO2 98%   BMI 45.85 kg/m?  ?Gen:   Awake, no distress   ?Resp:  Normal effort  ?MSK:   Moves extremities without difficulty  ?Other:  ? ?Medical Decision Making  ?Medically screening exam initiated at 1:20 PM.  Appropriate orders placed.  Cindy [name] was informed that the remainder of the evaluation will be completed by another provider, this initial triage assessment does not replace that evaluation, and the importance of remaining in the ED until their evaluation is complete. ? ?  ?Victorino Dike, FNP ?12/16/21 1323 ? ?

## 2021-12-16 NOTE — ED Provider Notes (Signed)
? ?[facility] ?Provider Note ? ? ? Event Date/Time  ? First MD Initiated Contact with Patient 12/16/21 1518   ?  (approximate) ? ? ?History  ? ?Vaginal Bleeding ? ? ?HPI ? ?Cindy [name] is a 26 y.o. female with no chronic medical history and as listed in EMR presents to the emergency department for treatment and evaluation of vaginal bleeding. She is approximately [redacted] weeks  pregnant. Bleeding stopped around 10 am, but continues to have pelvic cramping. No nausea or vomiting. ? ?  ? ? ?Physical Exam  ? ?Triage Vital Signs: ?ED Triage Vitals  ?Enc Vitals Group  ?   BP 12/16/21 1318 119/69  ?   Pulse Rate 12/16/21 1318 85  ?   Resp 12/16/21 1318 16  ?   Temp 12/16/21 1318 98.7 ?F (37.1 ?C)  ?   Temp Source 12/16/21 1318 Oral  ?   SpO2 12/16/21 1318 98 %  ?   Weight 12/16/21 1300 234 lb 12.6 oz (106.5 kg)  ?   Height 12/16/21 1300 5' (1.524 m)  ?   Head Circumference --   ?   Peak Flow --   ?   Pain Score 12/16/21 1300 5  ?   Pain Loc --   ?   Pain Edu? --   ?   Excl. in [place]? --   ? ? ?Most recent vital signs: ?Vitals:  ? 12/16/21 1318  ?BP: 119/69  ?Pulse: 85  ?Resp: 16  ?Temp: 98.7 ?F (37.1 ?C)  ?SpO2: 98%  ? ? ?General: Awake, no distress.  ?CV:  Good peripheral perfusion.  ?Resp:  Normal effort.  ?Abd:  No distention.  ?Other:   ? ? ?ED Results / Procedures / Treatments  ? ?Labs ?(all labs ordered are listed, but only abnormal results are displayed) ?Labs Reviewed  ?CBC WITH DIFFERENTIAL/PLATELET - Abnormal; Notable for the following components:  ?    Result Value  ? RBC 5.45 (*)   ? MCV 69.0 (*)   ? MCH 23.7 (*)   ? All other components within normal limits  ?URINALYSIS, ROUTINE W REFLEX MICROSCOPIC - Abnormal; Notable for the following components:  ? Color, Urine YELLOW (*)   ? APPearance HAZY (*)   ? All other components within normal limits  ?HCG, QUANTITATIVE, PREGNANCY - Abnormal; Notable for the following components:  ? hCG, Beta Chain, Quant, S 131,036 (*)   ? All other components  within normal limits  ?BASIC METABOLIC PANEL  ?ABO/RH  ? ? ? ?EKG ? ?Not indicated. ? ? ?RADIOLOGY ? ?Image and radiology report reviewed by me. ? ?Ultrasound shows single IUP 11 weeks 5 days with a fetal heart rate of 158.  There is also a subchorionic hemorrhage noted. ? ?PROCEDURES: ? ?Critical Care performed: No ? ?Procedures ? ? ?MEDICATIONS ORDERED IN ED: ?Medications  ?traMADol (ULTRAM) tablet 50 mg (50 mg Oral Given 12/16/21 1620)  ? ? ? ?IMPRESSION / MDM / ASSESSMENT AND PLAN / ED COURSE  ? ?I have reviewed the triage note. ? ?Differential diagnosis includes, but is not limited to, vaginal bleeding in pregnancy, miscarriage, threatened miscarriage ? ?26 year old female presenting to the emergency department for treatment and evaluation of vaginal bleeding and pelvic cramping.  See HPI for further details. ? ?While awaiting ER room assignment, labs were drawn.  Her beta hCG is elevated, urinalysis is unremarkable, CBC and BMP are without acute concerns.  Awaiting ultrasound and ABO Rh. ? ?Clinical Course as of  12/16/21 1757  ?Fri Dec 16, 2021  ?1523 Patient now complaining of intense low back pain in addition to the pelvic cramping. No vaginal bleeding. [CT]  ?1753 US shows IUP with subchorionic hemorrhage.  ?Results discussed with patient. She will call and schedule a follow up with her OB/GYN. ER return precautions and home care discussed. [CT]  ?  ?Clinical Course User Index ?[CT] Erich Kochan B, FNP  ? ? ? ?FINAL CLINICAL IMPRESSION(S) / ED DIAGNOSES  ? ?Final diagnoses:  ?Vaginal bleeding in pregnancy, first trimester  ?Subchorionic hematoma in first trimester, single or unspecified fetus  ? ? ? ?Rx / DC Orders  ? ?ED Discharge Orders   ? ? None  ? ?  ? ? ? ?Note:  This document was prepared using Dragon voice recognition software and may include unintentional dictation errors. ?  ?Victorino Dike, FNP ?12/16/21 1757 ? ?  ?Carrie Mew, MD ?12/16/21 2340 ? ?

## 2021-12-16 NOTE — Discharge Instructions (Signed)
Please call on Monday to schedule a follow-up appointment with the OB/GYN. ? ?If you have increased in pain or return of heavy vaginal bleeding return to the ER.  ? ?Take tylenol if needed. ?

## 2021-12-16 NOTE — ED Triage Notes (Signed)
C?O vaginal bleeding this this morning.  States she is [redacted] weeks pregnant.  A patient of [facility].  States bleeding has stopped at this time, but c/o lower abdominal cramping.  G3 P2 ? ?AAOx3.  Skin warm and dry. NAD ?

## 2021-12-16 NOTE — ED Notes (Signed)
FHT 145 in triage ?

## 2021-12-19 ENCOUNTER — Telehealth: Payer: Self-pay

## 2021-12-19 NOTE — Telephone Encounter (Signed)
Pt calling; went to hosp Friday for cramping and spotting; was told she was getting rid of a hemorrhage and is now high risk ob; 11wks; was told to let us know for further direction; what to do? [phone] ?

## 2021-12-19 NOTE — Telephone Encounter (Signed)
Keep appt next week.  An ultrasound for follow up will be looked at again in the future.  Let her know.

## 2021-12-19 NOTE — Telephone Encounter (Signed)
Pt aware.

## 2021-12-22 ENCOUNTER — Encounter: Payer: Medicaid Other | Admitting: Licensed Practical Nurse

## 2021-12-22 ENCOUNTER — Telehealth: Payer: Self-pay

## 2021-12-22 NOTE — Telephone Encounter (Signed)
Pt calling; 12wks; in hosp one week ago for pain and spotting; was told had a hemorrhage; today she is having sharp stabbing pain that is not relieved by tylenol.  [phone]  Pt states spotting is off and on and the sharp stabbing pain is all across her lower abdomen.  Tx'd to [facility] for scheduling. ?

## 2021-12-29 ENCOUNTER — Encounter: Payer: Medicaid Other | Admitting: Obstetrics and Gynecology

## 2022-01-02 ENCOUNTER — Telehealth: Payer: Self-pay

## 2022-01-02 ENCOUNTER — Other Ambulatory Visit: Payer: Self-pay | Admitting: Advanced Practice Midwife

## 2022-01-02 DIAGNOSIS — O209 Hemorrhage in early pregnancy, unspecified: Secondary | ICD-10-CM

## 2022-01-02 NOTE — Telephone Encounter (Signed)
Pt calling; has appt Thurs; is there anyway she can have an u/s done as well to f/u from ER visit for hemorrhage; is in pain.  [phone] ?

## 2022-01-02 NOTE — Progress Notes (Signed)
Order placed for f/u u/s subchorionic hemorrhage. ? ?

## 2022-01-02 NOTE — Progress Notes (Signed)
Stat f/u u/s ordered. ?

## 2022-01-03 ENCOUNTER — Other Ambulatory Visit: Payer: Self-pay | Admitting: Advanced Practice Midwife

## 2022-01-03 ENCOUNTER — Other Ambulatory Visit: Payer: Self-pay

## 2022-01-03 ENCOUNTER — Ambulatory Visit
Admission: RE | Admit: 2022-01-03 | Discharge: 2022-01-03 | Disposition: A | Discharge status: Home / Self Care | Payer: Medicaid Other | Source: Ambulatory Visit | Attending: Advanced Practice Midwife | Admitting: Advanced Practice Midwife

## 2022-01-03 DIAGNOSIS — O209 Hemorrhage in early pregnancy, unspecified: Secondary | ICD-10-CM

## 2022-01-03 NOTE — Telephone Encounter (Signed)
Patient was scheduled for [place] this AM. Patient will not be able to make due to transportation. I was able to rescheduled  and patient is aware she is scheduled this PM in [place] at 1:30. Patient agreed she was available then!

## 2022-01-05 ENCOUNTER — Encounter: Payer: Medicaid Other | Admitting: Obstetrics & Gynecology

## 2022-01-05 ENCOUNTER — Encounter: Payer: Medicaid Other | Admitting: Obstetrics and Gynecology

## 2022-01-06 ENCOUNTER — Other Ambulatory Visit: Payer: Self-pay

## 2022-01-06 ENCOUNTER — Encounter: Payer: Self-pay | Admitting: Obstetrics and Gynecology

## 2022-01-06 ENCOUNTER — Ambulatory Visit (INDEPENDENT_AMBULATORY_CARE_PROVIDER_SITE_OTHER): Payer: Medicaid Other | Admitting: Obstetrics and Gynecology

## 2022-01-06 VITALS — BP 102/70 | Wt 236.0 lb

## 2022-01-06 DIAGNOSIS — Z3A13 13 weeks gestation of pregnancy: Secondary | ICD-10-CM

## 2022-01-06 DIAGNOSIS — Z1379 Encounter for other screening for genetic and chromosomal anomalies: Secondary | ICD-10-CM

## 2022-01-06 DIAGNOSIS — O0990 Supervision of high risk pregnancy, unspecified, unspecified trimester: Secondary | ICD-10-CM

## 2022-01-06 NOTE — Patient Instructions (Signed)

## 2022-01-06 NOTE — Progress Notes (Signed)
? ? ?  Routine Prenatal Care Visit ? ?Subjective  ?Cindy [name] is a 26 y.o. [phone] at 54w4dbeing seen today for ongoing prenatal care.  She is currently monitored for the following issues for this high-risk pregnancy and has Iron deficiency anemia; Morbid obesity with BMI of 40.0-44.9, adult ([place]; Hemoglobin C trait ([place]; Mood disorder ([place]; Positive pregnancy test; and Supervision of high risk pregnancy, antepartum on their problem list.  ?----------------------------------------------------------------------------------- ?Patient reports no complaints.   ?Contractions: Not present. Vag. Bleeding: None.  Movement: Absent. Denies leaking of fluid.  ?----------------------------------------------------------------------------------- ?The following portions of the patient's history were reviewed and updated as appropriate: allergies, current medications, past family history, past medical history, past social history, past surgical history and problem list. Problem list updated. ? ? ?Objective  ?Blood pressure 102/70, weight 236 lb (107 kg), last menstrual period 10/03/2021, unknown if currently breastfeeding. ?Pregravid weight Pregravid weight not on file Total Weight Gain Not found. ?Urinalysis:     ? ?Fetal Status: Fetal Heart Rate (bpm): 150   Movement: Absent    ? ?General:  Alert, oriented and cooperative. Patient is in no acute distress.  ?Skin: Skin is warm and dry. No rash noted.   ?Cardiovascular: Normal heart rate noted  ?Respiratory: Normal respiratory effort, no problems with respiration noted  ?Abdomen: Soft, gravid, appropriate for gestational age. Pain/Pressure: Present     ?Pelvic:  Cervical exam deferred        ?Extremities: Normal range of motion.     ?Mental Status: Normal mood and affect. Normal behavior. Normal judgment and thought content.  ? ? ? ?Assessment  ? ?26y.o. [id number] 129w4dy  07/10/2022, by Last Menstrual Period presenting for routine prenatal visit ? ?Plan  ? ?pregnancy  Problems (from 12/06/21 to present)   ? ? Problem Noted Resolved  ? Supervision of high risk pregnancy, antepartum 12/06/2021 by FrImagene RichesCNM No  ? Overview Signed 12/06/2021  4:02 PM by FrImagene RichesCNM  ?   ?Nursing Staff Provider  ?Office Location  [facility] Dating    ?Language  English Anatomy [place]  ?Flu Vaccine   Genetic Screen  NIPS:   ?TDaP vaccine    Hgb A1C or  ?GTT Early : ?Third trimester :   ?Covid    LAB RESULTS   ?Rhogam   Blood Type     ?Feeding Plan  Antibody    ?Contraception  Rubella    ?Circumcision  RPR     ?Pediatrician   HBsAg     ?Support Person  HIV    ?Prenatal Classes  Varicella   ?  GBS  (For PCN allergy, check sensitivities)   ?BTL Consent     ?VBAC Consent  Pap    ?  Hgb Electro    ?  CF   ?   SMA   ?     ? ?  ?  ? ?  ?  ? ?NOB labs ?Maternit21 test ?Encouraged to start daily 81 mg ASA ?Discussed MFM appointment on 02/16/2022 ? ?Gestational age appropriate obstetric precautions including but not limited to vaginal bleeding, contractions, leaking of fluid and fetal movement were reviewed in detail with the patient.   ? ?Return in about 2 weeks (around 01/20/2022) for ROB in person with 1GTT. ? ?ChHomero FellersD ?[place]3/24/2023, 1:32 PM ? ? ?

## 2022-01-08 LAB — PROTEIN / CREATININE RATIO, URINE
Creatinine, Urine: 258.9 mg/dL
Protein, Ur: 18.5 mg/dL
Protein/Creat Ratio: 71 mg/g creat (ref 0–200)

## 2022-01-11 LAB — COMPREHENSIVE METABOLIC PANEL
ALT: 18 IU/L (ref 0–32)
AST: 13 IU/L (ref 0–40)
Albumin/Globulin Ratio: 1.2 (ref 1.2–2.2)
Albumin: 4.1 g/dL (ref 3.9–5.0)
Alkaline Phosphatase: 63 IU/L (ref 44–121)
BUN/Creatinine Ratio: 11 (ref 9–23)
BUN: 7 mg/dL (ref 6–20)
Bilirubin Total: 0.2 mg/dL (ref 0.0–1.2)
CO2: 19 mmol/L — ABNORMAL LOW (ref 20–29)
Calcium: 8.6 mg/dL — ABNORMAL LOW (ref 8.7–10.2)
Chloride: 101 mmol/L (ref 96–106)
Creatinine, Ser: 0.66 mg/dL (ref 0.57–1.00)
Globulin, Total: 3.3 g/dL (ref 1.5–4.5)
Glucose: 67 mg/dL — ABNORMAL LOW (ref 70–99)
Potassium: 4.1 mmol/L (ref 3.5–5.2)
Sodium: 137 mmol/L (ref 134–144)
Total Protein: 7.4 g/dL (ref 6.0–8.5)
eGFR: 126 mL/min/{1.73_m2} (ref >59)

## 2022-01-11 LAB — RPR+RH+ABO+RUB AB+AB SCR+CB...
Antibody Screen: NEGATIVE
HIV Screen 4th Generation wRfx: NONREACTIVE
Hematocrit: 40.7 % (ref 34.0–46.6)
Hemoglobin: 13.2 g/dL (ref 11.1–15.9)
Hepatitis B Surface Ag: NEGATIVE
MCH: 23.8 pg — ABNORMAL LOW (ref 26.6–33.0)
MCHC: 32.4 g/dL (ref 31.5–35.7)
MCV: 73 fL — ABNORMAL LOW (ref 79–97)
Platelets: 309 10*3/uL (ref 150–450)
RBC: 5.55 x10E6/uL — ABNORMAL HIGH (ref 3.77–5.28)
RDW: 16.4 % — ABNORMAL HIGH (ref 11.7–15.4)
RPR Ser Ql: NONREACTIVE
Rh Factor: POSITIVE
Rubella Antibodies, IGG: 13 index (ref >0.99)
Varicella zoster IgG: 457 index (ref >165)
WBC: 8.9 10*3/uL (ref 3.4–10.8)

## 2022-01-11 LAB — HGB FRACTIONATION BY HPLC
Hgb A2: 3.4 % — ABNORMAL HIGH (ref 1.8–3.2)
Hgb A: 63.7 % — ABNORMAL LOW (ref 96.4–98.8)
Hgb C: 31.9 % — ABNORMAL HIGH
Hgb E: 0 %
Hgb F: 1 % (ref 0.0–2.0)
Hgb S: 0 %
Hgb Variant: 0 %

## 2022-01-11 LAB — MATERNIT21 PLUS CORE+SCA
Fetal Fraction: 7
Monosomy X (Turner Syndrome): NOT DETECTED
Result (T21): NEGATIVE
Trisomy 13 (Patau syndrome): NEGATIVE
Trisomy 18 (Edwards syndrome): NEGATIVE
Trisomy 21 (Down syndrome): NEGATIVE
XXX (Triple X Syndrome): NOT DETECTED
XXY (Klinefelter Syndrome): NOT DETECTED
XYY (Jacobs Syndrome): NOT DETECTED

## 2022-01-11 LAB — HGB FRACTIONATION CASCADE

## 2022-01-11 LAB — HEPATITIS C ANTIBODY: Hep C Virus Ab: NONREACTIVE

## 2022-01-11 LAB — HEMOGLOBIN A1C
Est. average glucose Bld gHb Est-mCnc: 103 mg/dL
Hgb A1c MFr Bld: 5.2 % (ref 4.8–5.6)

## 2022-01-24 ENCOUNTER — Ambulatory Visit: Payer: Medicaid Other

## 2022-01-24 ENCOUNTER — Other Ambulatory Visit: Payer: Medicaid Other | Admitting: Obstetrics and Gynecology

## 2022-01-25 ENCOUNTER — Encounter: Payer: Medicaid Other | Admitting: Obstetrics and Gynecology

## 2022-01-25 ENCOUNTER — Other Ambulatory Visit: Payer: Medicaid Other

## 2022-01-30 ENCOUNTER — Emergency Department: Payer: Medicaid Other

## 2022-01-30 ENCOUNTER — Emergency Department
Admission: EM | Admit: 2022-01-30 | Discharge: 2022-01-30 | Disposition: A | Discharge status: Home / Self Care | Payer: Medicaid Other | Attending: Emergency Medicine | Admitting: Emergency Medicine

## 2022-01-30 ENCOUNTER — Other Ambulatory Visit: Payer: Self-pay

## 2022-01-30 DIAGNOSIS — O99282 Endocrine, nutritional and metabolic diseases complicating pregnancy, second trimester: Secondary | ICD-10-CM | POA: Diagnosis not present

## 2022-01-30 DIAGNOSIS — Z6730 Type AB blood, Rh positive: Secondary | ICD-10-CM | POA: Diagnosis not present

## 2022-01-30 DIAGNOSIS — R110 Nausea: Secondary | ICD-10-CM | POA: Diagnosis not present

## 2022-01-30 DIAGNOSIS — E876 Hypokalemia: Secondary | ICD-10-CM | POA: Insufficient documentation

## 2022-01-30 DIAGNOSIS — Z3A18 18 weeks gestation of pregnancy: Secondary | ICD-10-CM | POA: Insufficient documentation

## 2022-01-30 DIAGNOSIS — O26892 Other specified pregnancy related conditions, second trimester: Secondary | ICD-10-CM

## 2022-01-30 DIAGNOSIS — O200 Threatened abortion: Secondary | ICD-10-CM

## 2022-01-30 DIAGNOSIS — Z7982 Long term (current) use of aspirin: Secondary | ICD-10-CM | POA: Insufficient documentation

## 2022-01-30 DIAGNOSIS — O209 Hemorrhage in early pregnancy, unspecified: Secondary | ICD-10-CM | POA: Diagnosis present

## 2022-01-30 DIAGNOSIS — O4690 Antepartum hemorrhage, unspecified, unspecified trimester: Secondary | ICD-10-CM

## 2022-01-30 LAB — CBC WITH DIFFERENTIAL/PLATELET
Abs Immature Granulocytes: 0.08 10*3/uL — ABNORMAL HIGH (ref 0.00–0.07)
Basophils Absolute: 0 10*3/uL (ref 0.0–0.1)
Basophils Relative: 0 %
Eosinophils Absolute: 0.4 10*3/uL (ref 0.0–0.5)
Eosinophils Relative: 3 %
HCT: 35.6 % — ABNORMAL LOW (ref 36.0–46.0)
Hemoglobin: 12 g/dL (ref 12.0–15.0)
Immature Granulocytes: 1 %
Lymphocytes Relative: 26 %
Lymphs Abs: 3 10*3/uL (ref 0.7–4.0)
MCH: 24 pg — ABNORMAL LOW (ref 26.0–34.0)
MCHC: 33.7 g/dL (ref 30.0–36.0)
MCV: 71.1 fL — ABNORMAL LOW (ref 80.0–100.0)
Monocytes Absolute: 0.7 10*3/uL (ref 0.1–1.0)
Monocytes Relative: 6 %
Neutro Abs: 7.4 10*3/uL (ref 1.7–7.7)
Neutrophils Relative %: 64 %
Platelets: 297 10*3/uL (ref 150–400)
RBC: 5.01 MIL/uL (ref 3.87–5.11)
RDW: 15.4 % (ref 11.5–15.5)
WBC: 11.7 10*3/uL — ABNORMAL HIGH (ref 4.0–10.5)
nRBC: 0 % (ref 0.0–0.2)

## 2022-01-30 LAB — BASIC METABOLIC PANEL
Anion gap: 7 (ref 5–15)
BUN: 9 mg/dL (ref 6–20)
CO2: 24 mmol/L (ref 22–32)
Calcium: 9 mg/dL (ref 8.9–10.3)
Chloride: 107 mmol/L (ref 98–111)
Creatinine, Ser: 0.43 mg/dL — ABNORMAL LOW (ref 0.44–1.00)
GFR, Estimated: >60 mL/min (ref >60)
Glucose, Bld: 105 mg/dL — ABNORMAL HIGH (ref 70–99)
Potassium: 3.4 mmol/L — ABNORMAL LOW (ref 3.5–5.1)
Sodium: 138 mmol/L (ref 135–145)

## 2022-01-30 LAB — TYPE AND SCREEN
ABO/RH(D): AB POS
Antibody Screen: NEGATIVE

## 2022-01-30 LAB — HEPATIC FUNCTION PANEL
ALT: 24 U/L (ref 0–44)
AST: 17 U/L (ref 15–41)
Albumin: 3.3 g/dL — ABNORMAL LOW (ref 3.5–5.0)
Alkaline Phosphatase: 57 U/L (ref 38–126)
Bilirubin, Direct: <0.1 mg/dL (ref 0.0–0.2)
Total Bilirubin: 0.2 mg/dL — ABNORMAL LOW (ref 0.3–1.2)
Total Protein: 6.9 g/dL (ref 6.5–8.1)

## 2022-01-30 LAB — LIPASE, BLOOD: Lipase: 25 U/L (ref 11–51)

## 2022-01-30 LAB — URINALYSIS, ROUTINE W REFLEX MICROSCOPIC
Bilirubin Urine: NEGATIVE
Glucose, UA: NEGATIVE mg/dL
Hgb urine dipstick: NEGATIVE
Ketones, ur: NEGATIVE mg/dL
Leukocytes,Ua: NEGATIVE
Nitrite: NEGATIVE
Protein, ur: NEGATIVE mg/dL
Specific Gravity, Urine: 1.026 (ref 1.005–1.030)
pH: 6 (ref 5.0–8.0)

## 2022-01-30 LAB — HCG, QUANTITATIVE, PREGNANCY: hCG, Beta Chain, Quant, S: 31072 m[IU]/mL — ABNORMAL HIGH (ref <5)

## 2022-01-30 NOTE — Discharge Instructions (Signed)
Your workup in the Emergency Department today was reassuring.  We did not find any specific abnormalities.  Your lab work, urinalysis, and ultrasounds of the right upper quadrant and of your baby were all reassuring with no indication of any abnormality.  We recommend you drink plenty of fluids, take your regular medications and/or any new ones prescribed today, and follow up with the doctor(s) listed in these documents as recommended. ? ?Remember that any bleeding during pregnancy is considered a "threatened miscarriage" or the "threatened abortion".  You have been through this previously and should continue to be monitored by your OB/GYN provider and maternal-fetal medicine (MFM). ? ?Return to the Emergency Department if you develop new or worsening symptoms that concern you. ?

## 2022-01-30 NOTE — ED Provider Notes (Signed)
? ?[facility] ?Provider Note ? ? ? Event Date/Time  ? First MD Initiated Contact with Patient 01/30/22 0101   ?  (approximate) ? ? ?History  ? ?Near Syncope and Vaginal Bleeding ? ? ?HPI ? ?Cindy [name] is a 26 y.o. female  [id number] at 29 to [redacted] weeks gestation who is currently monitored for the following issues for this high-risk pregnancy:  Iron deficiency anemia; Morbid obesity with BMI of 40.0-44.9, adult ([place]); Hemoglobin C trait ([place]); Mood disorder ([place]).  She also had vaginal bleeding earlier in the pregnancy and is being maintained on a baby aspirin reportedly due to concerns about developing preeclampsia.  She reportedly has an MFM appointment on Feb 16, 2022.  She is currently being followed by Dr. Gilman Schmidt at [place]. ? ?She presents tonight for some additional vaginal spotting over the last 1 to 2 days but with acute onset upper abdominal pain and pressure tonight.  She said that the pain was sharp and waxes and wanes but does not go away.  Some nausea, no vomiting.  She said that she almost passed out earlier tonight.  She has been very tired and not feeling well in general.  She was concerned because she has also not felt the fetus move very much in the last couple of hours. ? ?She has not had a large amount of vaginal bleeding, just some spotting.  No fever, no dysuria. ? ?  ? ? ?Physical Exam  ? ?Triage Vital Signs: ?ED Triage Vitals  ?Enc Vitals Group  ?   BP 01/30/22 0044 130/72  ?   Pulse Rate 01/30/22 0044 92  ?   Resp 01/30/22 0044 19  ?   Temp 01/30/22 0044 98.2 ?F (36.8 ?C)  ?   Temp Source 01/30/22 0044 Oral  ?   SpO2 01/30/22 0044 99 %  ?   Weight 01/30/22 0045 104.3 kg (230 lb)  ?   Height 01/30/22 0045 1.524 m (5')  ?   Head Circumference --   ?   Peak Flow --   ?   Pain Score 01/30/22 0045 0  ?   Pain Loc --   ?   Pain Edu? --   ?   Excl. in [place]? --   ? ? ?Most recent vital signs: ?Vitals:  ? 01/30/22 0230 01/30/22 0300  ?BP: 105/60 105/61  ?Pulse: 84 90   ?Resp: (!) 21 (!) 21  ?Temp:    ?SpO2: 100% 97%  ? ? ? ?General: Awake, no distress though she appears somewhat uncomfortable. ?CV:  Good peripheral perfusion.  Normal heart sounds. ?Resp:  Normal effort.  No accessory muscle usage.  Clear lung sounds. ?Abd:  Obese with appropriately gravid uterus.  Mild tenderness to palpation in the epigastrium and right upper quadrant, negative Murphy sign.  No lower abdominal tenderness. ?Other:  Deferred pelvic exam given patient is in the second trimester. ? ? ?ED Results / Procedures / Treatments  ? ?Labs ?(all labs ordered are listed, but only abnormal results are displayed) ?Labs Reviewed  ?CBC WITH DIFFERENTIAL/PLATELET - Abnormal; Notable for the following components:  ?    Result Value  ? WBC 11.7 (*)   ? HCT 35.6 (*)   ? MCV 71.1 (*)   ? MCH 24.0 (*)   ? Abs Immature Granulocytes 0.08 (*)   ? All other components within normal limits  ?BASIC METABOLIC PANEL - Abnormal; Notable for the following components:  ? Potassium 3.4 (*)   ?  Glucose, Bld 105 (*)   ? Creatinine, Ser 0.43 (*)   ? All other components within normal limits  ?HCG, QUANTITATIVE, PREGNANCY - Abnormal; Notable for the following components:  ? hCG, Beta Chain, Quant, S 31,072 (*)   ? All other components within normal limits  ?HEPATIC FUNCTION PANEL - Abnormal; Notable for the following components:  ? Albumin 3.3 (*)   ? Total Bilirubin 0.2 (*)   ? All other components within normal limits  ?URINALYSIS, ROUTINE W REFLEX MICROSCOPIC - Abnormal; Notable for the following components:  ? Color, Urine YELLOW (*)   ? APPearance HAZY (*)   ? All other components within normal limits  ?LIPASE, BLOOD  ?TYPE AND SCREEN  ? ? ? ? ?RADIOLOGY ?I personally reviewed the patient's right upper quadrant ultrasound.  I see no evidence of biliary or hepatic abnormality.  The radiologist agrees. ? ?I also reviewed the patient's fetal ultrasound.  I saw no evidence of hemorrhage.  The gestation measurement was 18 weeks and 1  day which is appropriate and as anticipated, fetal heart rate was appropriate at about 152, and the radiologist did not identify any acute or emergent conditions. ? ? ? ?PROCEDURES: ? ?Critical Care performed: No ? ?Procedures ? ? ?MEDICATIONS ORDERED IN ED: ?Medications - No data to display ? ? ?IMPRESSION / MDM / ASSESSMENT AND PLAN / ED COURSE  ?I reviewed the triage vital signs and the nursing notes. ?             ?               ? ?Differential diagnosis includes, but is not limited to, biliary colic, constipation, acid reflux, esophageal irritation, discomfort of pregnancy, less likely retrocecal appendicitis or pregnancy complication. ? ?Given the patient's prior threatened miscarriage and her concern over pregnancy, as well as the vaginal spotting she describes that originally started back up, will obtain an OB ultrasound.  Additionally, given the mild epigastric tenderness and reported upper abdominal pain, I will obtain a right upper quadrant ultrasound rule out gallbladder disease, but I think it is more likely that she may be suffering from constipation or simple discomfort from pregnancy. ? ?Vital signs are stable and within normal limits.  No respiratory issues.  No lower abdominal pain.  No indication for emergent pelvic exam. ? ?Labs ordered include basic metabolic panel, hCG, CBC with differential, hepatic function panel, lipase, and type and screen. ? ?I will reassess after lab work and imaging.  At this point I doubt there will be an indication for an abdominal MRI. ? ?Clinical Course as of 01/30/22 0408  ?Mon Jan 30, 2022  ?0110 Hemoglobin: 12.0 ?Reassuring Hb [CF]  ?0222 HCG, Beta Chain, Quant, S(!): 31,072 [CF]  ?64 ABO/RH(D): AB POS ?No indication for RhoGAM [CF]  ?0102 Basic metabolic panel is reassuring with just a slight decrease of potassium of 3.4.  Lipase normal.  Hepatic function is within normal limits. [CF]  ?0311 We will see if the patient can provide a urine specimen to rule out  UTI during pregnancy. [CF]  ?0314 As indicated above, I personally reviewed patient's ultrasounds and there is no evidence of any acute abnormality of the right upper quadrant ultrasound.  OB ultrasound is reassuring with a single 18-week pregnancy acute abnormalities. [CF]  ?0314 We will see if the patient can provide a urine specimen to rule out UTI during pregnancy. [CF]  ?0401 Urinalysis, Routine w reflex microscopic(!) ?Urinalysis is negative.  I will  reassess the patient but anticipate discharge and outpatient follow-up. [CF]  ?9024 The patient was sleeping soundly but woke up easily.  She says she feels a little bit better.  I asked her about her bowel habits and she says she has been having bowel movements but I explained that some of her abdominal discomfort could be related to constipation.  I went over the results of her ultrasounds which are all reassuring with no indication of acute abnormality.  She is comfortable with the plan for discharge and close outpatient follow-up with OB/GYN and MFM.  I gave my usual and customary return precautions. [CF]  ?  ?Clinical Course User Index ?[CF] Hinda Kehr, MD  ? ? ? ?FINAL CLINICAL IMPRESSION(S) / ED DIAGNOSES  ? ?Final diagnoses:  ?Vaginal bleeding in pregnancy  ?Threatened miscarriage  ?Abdominal pain during pregnancy in second trimester  ? ? ? ?Rx / DC Orders  ? ?ED Discharge Orders   ? ? None  ? ?  ? ? ? ?Note:  This document was prepared using Dragon voice recognition software and may include unintentional dictation errors. ?  ?Hinda Kehr, MD ?01/30/22 0408 ? ?

## 2022-01-30 NOTE — ED Notes (Signed)
Fetal heart  tones noted by this RN with fetal doppler estimated and counted FHT 90 to 110. ?

## 2022-01-30 NOTE — ED Triage Notes (Addendum)
pt come from home, pt had a near syncopal episode per family, pt c/o fatigue and malaise, pt is [redacted]wks pregnant is a high risk pregnancy; pt is on ASA has been doing prenatal care; pt stated she has not felt fetus move within the last 2 hours; Gravida 3 Para 3 Living 2.  ?pt stated she is high risk due to bleeding at 13wks MD did ultrasound to confirm. pt did have spotting today was light red and not heavy bleeding, no clots noted. Pt did c/o some upper abdominal pain as well ?

## 2022-02-02 ENCOUNTER — Encounter: Payer: Self-pay | Admitting: Licensed Practical Nurse

## 2022-02-02 ENCOUNTER — Ambulatory Visit (INDEPENDENT_AMBULATORY_CARE_PROVIDER_SITE_OTHER): Payer: Medicaid Other | Admitting: Licensed Practical Nurse

## 2022-02-02 ENCOUNTER — Other Ambulatory Visit: Payer: Medicaid Other

## 2022-02-02 VITALS — BP 122/70 | Wt 242.0 lb

## 2022-02-02 DIAGNOSIS — O0990 Supervision of high risk pregnancy, unspecified, unspecified trimester: Secondary | ICD-10-CM

## 2022-02-02 DIAGNOSIS — Z3A17 17 weeks gestation of pregnancy: Secondary | ICD-10-CM

## 2022-02-02 LAB — POCT URINALYSIS DIPSTICK OB
Glucose, UA: NEGATIVE
POC,PROTEIN,UA: NEGATIVE

## 2022-02-02 NOTE — Progress Notes (Signed)
Routine Prenatal Care Visit ? ?Subjective  ?Cindy [name] [name] is a 26 y.o. [phone] at 69w3dbeing seen today for ongoing prenatal care.  She is currently monitored for the following issues for this high-risk pregnancy and has Iron deficiency anemia; Morbid obesity with BMI of 40.0-44.9, adult ([name]; [name]; [name]; Positive pregnancy test; and Supervision of high risk pregnancy, antepartum on their problem list.  ?----------------------------------------------------------------------------------- ?Patient reports Was seen in the Ed recently, while there she was told she is high risk and because she is high risk she would need a c/s.  Addressed her concerns, reassured that as of right now she does have a high risk pregnancy, but this does not mean she will need a c/s. Briefly reviewed reasons for a c/s.  Pt has had 2 SVB.  Has started baby ASA. Early one hour today. Goes on daily walks.  ?Contractions: Not present. Vag. Bleeding: None.  Movement: Present. Leaking Fluid denies.  ?----------------------------------------------------------------------------------- ?The following portions of the patient's history were reviewed and updated as appropriate: allergies, current medications, past family history, past medical history, past social history, past surgical history and problem list. Problem list updated. ? ?Objective  ?Blood pressure 122/70, weight 242 lb (109.8 kg), last menstrual period 10/03/2021, unknown if currently breastfeeding. ?Pregravid weight Pregravid weight not on file Total Weight Gain Not found. ?Urinalysis: Urine Protein    Urine Glucose   ? ?Fetal Status:     Movement: Present    ? ?General:  Alert, oriented and cooperative. Patient is in no acute distress.  ?Skin: Skin is warm and dry. No rash noted.   ?Cardiovascular: Normal heart rate noted  ?Respiratory: Normal respiratory effort, no problems with respiration noted  ?Abdomen: Soft, gravid, appropriate for gestational  age. Pain/Pressure: Present     ?Pelvic:  Cervical exam deferred        ?Extremities: Normal range of motion.     ?Mental Status: Normal mood and affect. Normal behavior. Normal judgment and thought content.  ? ?Assessment  ? ?26y.o. [id number] 128w3dy  07/10/2022, by Last Menstrual Period presenting for routine prenatal visit ? ?Plan  ? ?pregnancy Problems (from 12/06/21 to present)   ? ? Problem Noted Resolved  ? Supervision of high risk pregnancy, antepartum 12/06/2021 by Cindy [name] RichesCNM No  ? Overview Addendum 02/02/2022  4:03 PM by Cindy [name] DerryCNM  ?   ?Nursing Staff Provider  ?Office Location  [facility] Dating  [id number]/23  ?Language  English Anatomy [place] ?Flu Vaccine   Genetic Screen  NIPS: female  ?TDaP vaccine    Hgb A1C or  ?GTT Early : ?Third trimester :   ?Covid    LAB RESULTS   ?Rhogam  NA Blood Type --/--/AB POS (04/17 0047)   ?Feeding Plan Bottle Antibody NEG (04/17 0047)  ?Contraception  Rubella 13.00 (03/24 1339)  ?Circumcision NA RPR Non Reactive (03/24 1339)   ?Pediatrician   HBsAg Negative (03/24 1339)   ?Support Person  HIV Non Reactive (03/24 1339)  ?Prenatal Classes  Varicella immune  ?  GBS  (For PCN allergy, check sensitivities)   ?BTL Consent     ?VBAC Consent  Pap 12/06/21 neg, hpv neg   ?  Hgb Electro    ?  CF   ?   SMA   ?     ? ? ?  ?  ? ?  ?  ? ?general obstetric precautions including but not limited to  vaginal bleeding, contractions, leaking of fluid and fetal movement were reviewed in detail with the patient. ?Please refer to After Visit Summary for other counseling recommendations.  ? ?Return in about 4 weeks (around 03/02/2022) for [place]. ? ?Has MFM appointment on 5/4 ? ?Cindy [name] [name], CNM  ?Cindy [name] [name], [facility]  ?02/02/22  ?4:06 PM  ? ? ?

## 2022-02-03 LAB — GLUCOSE TOLERANCE, 1 HOUR: Glucose, 1Hr PP: 96 mg/dL (ref 70–199)

## 2022-02-14 ENCOUNTER — Other Ambulatory Visit: Payer: Self-pay

## 2022-02-16 ENCOUNTER — Emergency Department: Payer: Medicaid Other

## 2022-02-16 ENCOUNTER — Ambulatory Visit: Payer: Medicaid Other | Attending: Obstetrics

## 2022-02-16 ENCOUNTER — Ambulatory Visit (HOSPITAL_BASED_OUTPATIENT_CLINIC_OR_DEPARTMENT_OTHER): Payer: Medicaid Other | Admitting: Obstetrics

## 2022-02-16 ENCOUNTER — Emergency Department
Admission: EM | Admit: 2022-02-16 | Discharge: 2022-02-16 | Disposition: A | Discharge status: Home / Self Care | Payer: Medicaid Other | Attending: Emergency Medicine | Admitting: Emergency Medicine

## 2022-02-16 ENCOUNTER — Other Ambulatory Visit: Payer: Self-pay

## 2022-02-16 DIAGNOSIS — O99213 Obesity complicating pregnancy, third trimester: Secondary | ICD-10-CM | POA: Diagnosis present

## 2022-02-16 DIAGNOSIS — O99212 Obesity complicating pregnancy, second trimester: Secondary | ICD-10-CM | POA: Insufficient documentation

## 2022-02-16 DIAGNOSIS — O0990 Supervision of high risk pregnancy, unspecified, unspecified trimester: Secondary | ICD-10-CM

## 2022-02-16 DIAGNOSIS — W19XXXA Unspecified fall, initial encounter: Secondary | ICD-10-CM

## 2022-02-16 DIAGNOSIS — Z3A19 19 weeks gestation of pregnancy: Secondary | ICD-10-CM

## 2022-02-16 DIAGNOSIS — O26892 Other specified pregnancy related conditions, second trimester: Secondary | ICD-10-CM | POA: Insufficient documentation

## 2022-02-16 DIAGNOSIS — E669 Obesity, unspecified: Secondary | ICD-10-CM | POA: Diagnosis not present

## 2022-02-16 DIAGNOSIS — D582 Other hemoglobinopathies: Secondary | ICD-10-CM | POA: Insufficient documentation

## 2022-02-16 DIAGNOSIS — O285 Abnormal chromosomal and genetic finding on antenatal screening of mother: Secondary | ICD-10-CM

## 2022-02-16 DIAGNOSIS — O9A212 Injury, poisoning and certain other consequences of external causes complicating pregnancy, second trimester: Secondary | ICD-10-CM | POA: Insufficient documentation

## 2022-02-16 DIAGNOSIS — O09212 Supervision of pregnancy with history of pre-term labor, second trimester: Secondary | ICD-10-CM | POA: Diagnosis not present

## 2022-02-16 DIAGNOSIS — M545 Low back pain, unspecified: Secondary | ICD-10-CM | POA: Insufficient documentation

## 2022-02-16 DIAGNOSIS — O99012 Anemia complicating pregnancy, second trimester: Secondary | ICD-10-CM

## 2022-02-16 DIAGNOSIS — Z3A20 20 weeks gestation of pregnancy: Secondary | ICD-10-CM | POA: Diagnosis not present

## 2022-02-16 DIAGNOSIS — W010XXA Fall on same level from slipping, tripping and stumbling without subsequent striking against object, initial encounter: Secondary | ICD-10-CM | POA: Diagnosis not present

## 2022-02-16 MED ORDER — LIDOCAINE 5 % EX PTCH: 1.0000 | MEDICATED_PATCH | Freq: Two times a day (BID) | CUTANEOUS | 0 refills | Status: AC | PRN

## 2022-02-16 MED ORDER — ACETAMINOPHEN 500 MG PO TABS
1000.0000 mg | ORAL_TABLET | Freq: Once | ORAL | Status: AC
  Administered 2022-02-16: 1000 mg via ORAL
  Filled 2022-02-16: qty 2

## 2022-02-16 MED ORDER — METOCLOPRAMIDE HCL 10 MG PO TABS: 10.0000 mg | ORAL_TABLET | Freq: Three times a day (TID) | ORAL | 0 refills | Status: DC

## 2022-02-16 MED ORDER — LIDOCAINE 5 % EX PTCH
1.0000 | MEDICATED_PATCH | Freq: Once | CUTANEOUS | Status: DC
  Administered 2022-02-16: 1 via TRANSDERMAL
  Filled 2022-02-16: qty 1

## 2022-02-16 MED ORDER — DIPHENHYDRAMINE HCL 25 MG PO CAPS
50.0000 mg | ORAL_CAPSULE | Freq: Once | ORAL | Status: AC
  Administered 2022-02-16: 50 mg via ORAL
  Filled 2022-02-16: qty 2

## 2022-02-16 MED ORDER — METOCLOPRAMIDE HCL 10 MG PO TABS
10.0000 mg | ORAL_TABLET | Freq: Once | ORAL | Status: AC
  Administered 2022-02-16: 10 mg via ORAL
  Filled 2022-02-16: qty 1

## 2022-02-16 NOTE — ED Provider Triage Note (Signed)
?  Emergency Medicine Provider Triage Evaluation Note ? ?Cindy [name] , a 26 y.o.female,  was evaluated in triage.  Pt complains of injuries from fall.  Patient reports mechanical fall today, causing her to fall onto her bottom.  Since then, she has had persistent lower abdominal pain.  She is concerned about the pregnancy, as she is [redacted] weeks pregnant. ? ? ?Review of Systems  ?Positive: Abdominal/pelvic pain ?Negative: Denies fever, chest pain, vomiting, vaginal bleeding ? ?Physical Exam  ? ?Vitals:  ? 02/16/22 1741  ?BP: 127/77  ?Pulse: 100  ?Resp: 16  ?Temp: 98.1 ?F (36.7 ?C)  ?SpO2: 96%  ? ?Gen:   Awake, no distress   ?Resp:  Normal effort  ?MSK:   Moves extremities without difficulty  ?Other:   ? ?Medical Decision Making  ?Given the patient's initial medical screening exam, the following diagnostic evaluation has been ordered. The patient will be placed in the appropriate treatment space, once one is available, to complete the evaluation and treatment. I have discussed the plan of care with the patient and I have advised the patient that an ED physician or mid-level practitioner will reevaluate their condition after the test results have been received, as the results may give them additional insight into the type of treatment they may need.  ? ? ?Diagnostics: OB ultrasound ? ?Treatments: none immediately ?  ?Teodoro Spray, [place] ?02/16/22 1746 ? ?

## 2022-02-16 NOTE — Progress Notes (Signed)
MFM Note ? 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 ? ?She denies any significant past medical history and denies any problems in her current pregnancy.   ? ?She had a cell free DNA test earlier in her pregnancy which indicated a low risk for trisomy 71, 48, and 13. A female fetus is predicted.  ? ?She was informed that the fetal growth and amniotic fluid level were appropriate for her gestational age.  ? ?There were no obvious fetal anomalies noted on today's ultrasound exam. ? ?The patient was informed that anomalies may be missed due to technical limitations. If the fetus is in a suboptimal position or maternal habitus is increased, visualization of the fetus in the maternal uterus may be impaired. ? ?The following were discussed during our consultation today: ? ?Obesity in pregnancy ? ?The recommended total weight gain in pregnancy for obese women's between 10 to 20 pounds. ? ?The patient reports that her early screen for gestational diabetes was negative.  She should be rescreened for diabetes again at between 26 to 28 weeks. ? ?As maternal obesity may present challenges associated with the management of anesthesia, an anesthesia consult should be obtained when she is admitted in labor. ? ?She should continue taking a daily baby aspirin for preeclampsia prophylaxis. ? ?Due to maternal obesity, we will continue to follow her with monthly growth ultrasounds.   ? ?Weekly fetal testing should be started at 34 weeks. ? 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 ?Hemoglobin C (Hb C) is a common structural hemoglobin variant. Persons with hemoglobin C trait (Hb AC) are phenotypically normal, with no clinically evident symptoms. ? ?She was advised that she can have her partner tested to determine if he is also a carrier of hemoglobin C trait or other blood disorders. ? ?She was advised that should both parents be  carriers for the hemoglobin C trait, there is a 25% chance that the baby can have hemoglobin C disease. ? ?She understands that prenatal diagnosis of hemoglobin C disease and other blood diseases is available with an amniocentesis.  She was offered and declined an amniocentesis today. ? ?I will have our genetic counselor contact the patient next week to discuss the significance of being a carrier for the hemoglobin C trait. ? ?The patient stated that all of her questions have been answered today. ? ?A follow-up growth scan was scheduled in 4 weeks. ? ?A total of 30 minutes was spent counseling and coordinating the care for this patient.  Greater than 50% of the time was spent in direct face-to-face contact. Follow up as indicated. ?

## 2022-02-16 NOTE — Discharge Instructions (Signed)
Your exam and [place] are normal following your fall. There is no evidence of a serious injury to the baby. Your symptoms are consistent with a lumbar muscle strain. Use the lidocaine patches and OTC Tylenol as needed. For headache pain relief, consider OTC Benadryl along with the prescription anti-nausea medicine. Follow-up with your OB provider as needed.  ?

## 2022-02-16 NOTE — ED Notes (Signed)
This RN first encounter with pt prior to discharge. Pt verbalized understanding of discharge instructions, prescriptions, and follow-up care instructions. Pt advised if symptoms worsen to return to ED.  

## 2022-02-16 NOTE — ED Triage Notes (Addendum)
Patient to ER via POV, states that she had a ground level fall today. Reports being [redacted] weeks pregnant. States that she still feels fetal movement, no vaginal bleeding. Reports 10/10 lower back and abdominal pain from the fall. Did not hit head/no LOC.  ? ?FHT completed in triage. ?

## 2022-02-16 NOTE — ED Provider Notes (Signed)
? ? ?[facility] ?Emergency Department Provider Note ? ? ? ? Event Date/Time  ? First MD Initiated Contact with Patient 02/16/22 1834   ?  (approximate) ? ? ?History  ? ?Fall ? ? ?HPI ? ?Cindy [name] is a 26 y.o. female at [redacted] weeks gestation, presents to the ED following mechanical fall.  She reports slip and fall onto her buttocks, without reported head injury or LOC.  She denies any preceding weakness, dizziness, syncope, or ongoing complaints of bladder and bowel incontinence, foot drop, or saddle anesthesia.  She does voice some concern over the pregnancy.  She denies any abnormal vaginal bleeding or discharge.  She reports active fetal movement. ? ? ?Physical Exam  ? ?Triage Vital Signs: ?ED Triage Vitals  ?Enc Vitals Group  ?   BP 02/16/22 1741 127/77  ?   Pulse Rate 02/16/22 1741 100  ?   Resp 02/16/22 1741 16  ?   Temp 02/16/22 1741 98.1 ?F (36.7 ?C)  ?   Temp Source 02/16/22 1741 Oral  ?   SpO2 02/16/22 1741 96 %  ?   Weight 02/16/22 1834 241 lb 13.5 oz (109.7 kg)  ?   Height 02/16/22 1834 5' (1.524 m)  ?   Head Circumference --   ?   Peak Flow --   ?   Pain Score 02/16/22 1746 Asleep  ?   Pain Loc --   ?   Pain Edu? --   ?   Excl. in [place]? --   ? ? ?Most recent vital signs: ?Vitals:  ? 02/16/22 1741 02/16/22 1958  ?BP: 127/77 122/69  ?Pulse: 100 94  ?Resp: 16 16  ?Temp: 98.1 ?F (36.7 ?C) 97.7 ?F (36.5 ?C)  ?SpO2: 96% 100%  ? ? ?General Awake, no distress.  ?CV:  Good peripheral perfusion.  ?RESP:  Normal effort.  ?ABD:  No distention. Gravid. Normal bowel sounds noted ?MSK;  normal spinal alignment without significant midline tenderness, spasm, deformity, or step-off.  Patient with normal active flexion extension range of the hips. ?NEURO: Cranial nerves II to XII grossly intact.  Normal LE DTRs bilaterally. ? ? ?ED Results / Procedures / Treatments  ? ?Labs ?(all labs ordered are listed, but only abnormal results are displayed) ?Labs Reviewed - No data to  display ? ? ?EKG ? ? ?RADIOLOGY ? ?I personally viewed and evaluated these images as part of my medical decision making, as well as reviewing the written report by the radiologist. ? ?ED Provider Interpretation: IUP noted} ? ? ?PROCEDURES: ? ?Critical Care performed: No ? ?Procedures ? ? ?MEDICATIONS ORDERED IN ED: ?Medications  ?lidocaine (LIDODERM) 5 % 1 patch (1 patch Transdermal Patch Applied 02/16/22 1934)  ?diphenhydrAMINE (BENADRYL) capsule 50 mg (50 mg Oral Given 02/16/22 1934)  ?metoCLOPramide (REGLAN) tablet 10 mg (10 mg Oral Given 02/16/22 1934)  ?acetaminophen (TYLENOL) tablet 1,000 mg (1,000 mg Oral Given 02/16/22 1934)  ? ? ? ?IMPRESSION / MDM / ASSESSMENT AND PLAN / ED COURSE  ?I reviewed the triage vital signs and the nursing notes. ?             ?               ? ?Differential diagnosis includes, but is not limited to, buttocks contusion, lumbar strain, myalgias, musculoskeletal pain. ? ?Gravid female to the ED for evaluation of injuries following a mechanical fall.  Patient slipped on a pebble and landed on her buttocks.  She presents in  no acute distress with some mild low back pain and some concern for fetal viability.  She is evaluated with an ultrasound in the ED which is reassuring and shows a stable IUP.  Patient's mechanical pain is treated with Lidoderm patches, Tylenol, and Reglan for reported headache pain.  Exam is otherwise reassuring as patient is stable for discharge home.  Patient's diagnosis is consistent with chemical fall resulting in lumbar strain. Patient will be discharged home with prescriptions for Lidoderm patches and Reglan. Patient is to follow up with her OB provider as needed or otherwise directed. Patient is given ED precautions to return to the ED for any worsening or new symptoms. ? ? ?FINAL CLINICAL IMPRESSION(S) / ED DIAGNOSES  ? ?Final diagnoses:  ?Fall, initial encounter  ? ? ? ?Rx / DC Orders  ? ?ED Discharge Orders   ? ?      Ordered  ?  metoCLOPramide (REGLAN) 10 MG  tablet  3 times daily with meals       ? 02/16/22 1916  ?  lidocaine (LIDODERM) 5 %  Every 12 hours PRN       ? 02/16/22 1916  ? ?  ?  ? ?  ? ? ? ?Note:  This document was prepared using Dragon voice recognition software and may include unintentional dictation errors. ? ?  ?Saraih Lorton, Dannielle Karvonen, PA-C ?02/16/22 2050 ? ?  ?Blake Divine, MD ?02/17/22 1623 ? ?

## 2022-02-18 ENCOUNTER — Other Ambulatory Visit: Payer: Self-pay

## 2022-02-18 ENCOUNTER — Encounter: Payer: Self-pay | Admitting: Obstetrics and Gynecology

## 2022-02-18 ENCOUNTER — Observation Stay
Admission: EM | Admit: 2022-02-18 | Discharge: 2022-02-18 | Disposition: A | Discharge status: Home / Self Care | Payer: Medicaid Other | Attending: Licensed Practical Nurse | Admitting: Licensed Practical Nurse

## 2022-02-18 ENCOUNTER — Other Ambulatory Visit: Payer: Self-pay | Admitting: Obstetrics

## 2022-02-18 DIAGNOSIS — Z7982 Long term (current) use of aspirin: Secondary | ICD-10-CM | POA: Insufficient documentation

## 2022-02-18 DIAGNOSIS — Z3A20 20 weeks gestation of pregnancy: Secondary | ICD-10-CM | POA: Diagnosis not present

## 2022-02-18 DIAGNOSIS — R109 Unspecified abdominal pain: Secondary | ICD-10-CM | POA: Diagnosis present

## 2022-02-18 DIAGNOSIS — W19XXXD Unspecified fall, subsequent encounter: Secondary | ICD-10-CM | POA: Diagnosis not present

## 2022-02-18 DIAGNOSIS — O26892 Other specified pregnancy related conditions, second trimester: Principal | ICD-10-CM

## 2022-02-18 DIAGNOSIS — R1030 Lower abdominal pain, unspecified: Secondary | ICD-10-CM | POA: Diagnosis not present

## 2022-02-18 LAB — URINALYSIS, COMPLETE (UACMP) WITH MICROSCOPIC
Bacteria, UA: NONE SEEN
Bilirubin Urine: NEGATIVE
Glucose, UA: NEGATIVE mg/dL
Hgb urine dipstick: NEGATIVE
Ketones, ur: NEGATIVE mg/dL
Leukocytes,Ua: NEGATIVE
Nitrite: NEGATIVE
Protein, ur: NEGATIVE mg/dL
Specific Gravity, Urine: 1.018 (ref 1.005–1.030)
pH: 7 (ref 5.0–8.0)

## 2022-02-18 MED ORDER — CYCLOBENZAPRINE HCL 5 MG PO TABS: 5.0000 mg | ORAL_TABLET | Freq: Three times a day (TID) | ORAL | 0 refills | Status: AC | PRN

## 2022-02-18 MED ORDER — OXYCODONE-ACETAMINOPHEN 5-325 MG PO TABS
1.0000 | ORAL_TABLET | Freq: Once | ORAL | Status: AC
  Administered 2022-02-18: 1 via ORAL
  Filled 2022-02-18: qty 1

## 2022-02-18 MED ORDER — CYCLOBENZAPRINE HCL ER 15 MG PO CP24: 15.0000 mg | ORAL_CAPSULE | Freq: Every day | ORAL | 0 refills | Status: DC | PRN

## 2022-02-18 NOTE — Discharge Instructions (Signed)
[email or web address] 

## 2022-02-18 NOTE — OB Triage Note (Signed)
Pt is a G4P2 at 92w3dpresenting to L&D triage c/o abdominal pain rating it a 10/10 that is constant. Pt denies ctx/cramping, vaginal bleeding, and LOF. +FM. FHT 140. Pt endorses falling two days ago and states the pain has not been relieved. Pt endorses taking tylenol 02/17/2022 in the afternoon and states it did not help.  ?

## 2022-02-18 NOTE — Discharge Summary (Signed)
Physician Final Progress Note ? ?Patient ID: ?Cindy [name] ?MRN: [id number] ?DOB/AGE: 1996/10/03 25 y.o. ? ?Admit date: 02/18/2022 ?Admitting provider: Harlin Heys, MD ?Discharge date: 02/18/2022 ? ? ?Admission Diagnoses:  ?1) intrauterine pregnancy at [redacted]w[redacted]d ?2) lower abdominal pain  ? ?Discharge Diagnoses:  ?Principal Problem: ?  Abdominal pain ?  ? ?History of Present Illness: The patient is a 26y.o. female [id number] 273w3dho presents for lower abdominal pain.   She has been experiencing constant lower abdominal pain since Thursday.  She was evaluated in the ED on 5/4 for a fall, she fell going down stairs, she fell on her buttocks and did not hit her abdomen. At that time she reported back pain and pain in the abdomen. [place] a normal placenta. She was given lidocaine patches. The lidocaine has helped her back pan but she continues to sharp pains along the sides of her abdomen.  Reports having "regular" BM's, last had a BM yesterday. Denies vaginal bleeding or contractions. Endorses +FM but reports when she is on her side and the fetus kicks "it is really painful". Expresses concern that the baby is stressed because she is in so much pain.  ? ?Past Medical History:  ?Diagnosis Date  ? Anemia   ? Depression   ? is past hx but not active now per pt  ? Morbidly obese ([place]  ? notes from health dept as hx  ? 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 ? ?Past Surgical History:  ?Procedure Laterality Date  ? COLONOSCOPY WITH PROPOFOL N/A 06/15/2017  ? Procedure: COLONOSCOPY WITH PROPOFOL;  Surgeon: AnJonathon BellowsMD;  [facility];  Service: Gastroenterology;  Laterality: N/A;  ? ESOPHAGOGASTRODUODENOSCOPY (EGD) WITH PROPOFOL N/A 06/15/2017  ? Procedure: ESOPHAGOGASTRODUODENOSCOPY (EGD) WITH PROPOFOL;  Surgeon: AnJonathon BellowsMD;  [facility];  Service: Gastro[facility]ology;  Laterality: N/A;  ? TONSILLECTOMY    ? ? ?No current facility-administered medications on file prior to encounter.   ? ?Current Outpatient Medications on File Prior to Encounter  ?Medication Sig Dispense Refill  ? aspirin EC 81 MG tablet Take 81 mg by mouth daily. Swallow whole.    ? lidocaine (LIDODERM) 5 % Place 1 patch onto the skin every 12 (twelve) hours as needed for up to 10 days. Remove & Discard patch after 12 hours of wear each day. 10 patch 0  ? metoCLOPramide (REGLAN) 10 MG tablet Take 1 tablet (10 mg total) by mouth 3 (three) times daily with meals for 10 days. (Patient not taking: Reported on 02/18/2022) 30 tablet 0  ? ondansetron (ZOFRAN) 4 MG tablet Take 4 mg by mouth every 8 (eight) hours as needed for nausea or vomiting. (Patient not taking: Reported on 02/16/2022)    ? Prenatal Vit-Fe Fumarate-FA (PRENATAL MULTIVITAMIN) TABS tablet Take 1 tablet by mouth daily at 12 noon.    ? ? ?No Known Allergies ? ?Social History  ? ?Socioeconomic History  ? Marital status: Significant Other  ?  Spouse name: JaBridgett Larsson? Number of children: 1  ? Years of education: Not on file  ? Highest education level: 9th grade  ?Occupational History  ? Not on file  ?Tobacco Use  ? Smoking status: Never  ? Smokeless tobacco: Never  ?Vaping Use  ? Vaping Use: Never used  ?Substance and Sexual Activity  ? Alcohol use: No  ?  Comment: former  ? Drug use: Not Currently  ?  Types: Marijuana  ?  Comment:  last used in sept  ? Sexual activity: Not Currently  ?  Partners: Male  ?  Birth control/protection: Implant  ?Other Topics Concern  ? Not on file  ?Social History Narrative  ? Not on file  ? ?Social Determinants of Health  ? ?Financial Resource Strain: Not on file  ?Food Insecurity: Not on file  ?Transportation Needs: Not on file  ?Physical Activity: Not on file  ?Stress: Not on file  ?Social Connections: Not on file  ?Intimate Partner Violence: Not on file  ? ? ?Family History  ?Problem Relation Age of Onset  ? Thyroid disease Mother   ? Hypertension Mother   ? Sickle cell trait Mother   ? Sickle cell anemia Sister   ? Hypertension Maternal  Grandmother   ?  ? ?Review of Systems  ?Constitutional: Negative.   ?Gastrointestinal: Negative.   ?Genitourinary: Negative.   ?Musculoskeletal:  Positive for back pain.  ?Neurological: Negative.   ?Psychiatric/Behavioral:    ?     Admits to history of anxiety and depression   ? ?Physical Exam: ?BP 129/68 (BP Location: Left Arm)   Pulse 86   Temp 98.5 ?F (36.9 ?C) (Oral)   Resp 18   Ht 5' (1.524 m)   Wt 110.2 kg   LMP 10/03/2021 (Exact Date)   BMI 47.46 kg/m?   ?Physical Exam ?Constitutional:   ?   Appearance: Normal appearance.  ?Pulmonary:  ?   Effort: Pulmonary effort is normal.  ?Abdominal:  ?   Comments: Gravid, soft, reported feeling tenderness when palpated   ?Musculoskeletal:  ?   Cervical back: Normal range of motion.  ?Neurological:  ?   General: No focal deficit present.  ?   Mental Status: She is alert and oriented to person, place, and time.  ?Skin: ?   General: Skin is dry.  ?Psychiatric:     ?   Mood and Affect: Mood normal.  ? ?FHT 140 ?TOCO: none  ? ?Consults: None ? ?Significant Findings/ Diagnostic Studies: none ? ?Procedures: none ? ?Hospital Course: The patient was admitted to Labor and Delivery Triage for observation. She was given 1 percocet which gave her relief.  Offered 3 tablets to be sent to her pharmacy, prefers to have a script for a muscle relaxer. Script for Flexeril sent.  ? ?Discharge Condition: stable ? ?Disposition: Discharge disposition: 01-Home or Self Care ? ? ? ? ?Discussed that she had a normal [place] on 5/4.  There is no concern for the fetus at this time. The pain could be related to a pulled muscle from the fall or normal discomforts of pregnancy. Encouraged belly binding and warm baths once home.  ?-if pain persists please come to the office sooner than your scheduled ROB on May 18 ?-discussed mindful birthing as a way to cope with any anxiety she may be experiencing in pregnancy.  ? ?Diet: Regular diet ? ?Discharge Activity: Activity as tolerated ? ? ?Allergies as of  02/18/2022   ?No Known Allergies ?  ? ?  ?Medication List  ?  ? ?STOP taking these medications   ? ?metoCLOPramide 10 MG tablet ?Commonly known as: REGLAN ?  ?ondansetron 4 MG tablet ?Commonly known as: ZOFRAN ?  ? ?  ? ?TAKE these medications   ? ?aspirin EC 81 MG tablet ?Take 81 mg by mouth daily. Swallow whole. ?  ?cyclobenzaprine 15 MG 24 hr capsule ?Commonly known as: Amrix ?Take 1 capsule (15 mg total) by mouth daily as needed for up to  7 days for muscle spasms. ?  ?lidocaine 5 % ?Commonly known as: Lidoderm ?Place 1 patch onto the skin every 12 (twelve) hours as needed for up to 10 days. Remove & Discard patch after 12 hours of wear each day. ?  ?prenatal multivitamin Tabs tablet ?Take 1 tablet by mouth daily at 12 noon. ?  ? ?  ? ? ? ?Total time spent taking care of this patient: 30 minutes ? ?Signed: ?Jillene Bucks Sayre [place]itherington, CNM  ?02/18/2022, 5:37 AM  ?

## 2022-02-18 NOTE — Progress Notes (Signed)
Cindy [name] called to say that her pharmacy does not carry the dose of cyclobenzaprine prescribed. I spoke to the pharmacist who states they do have the 5 mg pill available. New order sent. LVM with [name]. ? ?M. Norberto Sorenson, CNM ?

## 2022-02-18 NOTE — Progress Notes (Signed)
? ?Obstetric H&P  ? ?Chief Complaint: abdominal pain  ? ?Prenatal Care Provider: [facility]  ? ?History of Present Illness: 26 y.o. [id number] 3w3dby 07/05/2022, by Ultrasound presenting to L&D.  She has been experiencing constant lower abdominal pain since Thursday.  She was evaluated in the ED on 5/4 for a fall, she fell going down stairs, she fell on her buttocks and did not hit her abdomen. At that time she reported back pain and pain in the abdomen. [place] a normal placenta. She was given lidocaine patches. The lidocaine has helped her back pan but she continues to sharp pains along the sides of her abdomen.  Reports having "regular" BM's, last had a BM yesterday. Denies vaginal bleeding or contractions. Endorses +FM but reports when she is on her side and the fetus kicks "it is really painful". Expresses concern that the baby is stressed because she is in so much pain.  ? ?Pregravid weight Pregravid weight not on file Total Weight Gain Not found. ? ?pregnancy Problems (from 12/06/21 to present)   ? ? Problem Noted Resolved  ? Supervision of high risk pregnancy, antepartum 12/06/2021 by FImagene Riches CNM No  ? Overview Addendum 02/17/2022 12:27 AM by FImagene Riches CNM  ?   ?Nursing Staff Provider  ?Office Location  [facility] Dating  525w6d/24/23  ?Language  English Anatomy [place] female  ?Flu Vaccine   Genetic Screen  NIPS: female  ?TDaP vaccine    Hgb A1C or  ?GTT Early :5.2 ?Third trimester :   ?Covid    LAB RESULTS   ?Rhogam  NA Blood Type   AB+  ?Feeding Plan Bottle Antibody  negative  ?Contraception  Rubella  immune  ?Circumcision NA RPR   NR  ?Pediatrician   HBsAg   negative  ?Support Person  HIV  negative  ?Prenatal Classes  Varicella immune  ?  GBS  (For PCN allergy, check sensitivities)   ?BTL Consent   Has Hemoglobin C trait.  ?VBAC Consent  Pap 12/06/21 neg, hpv neg   ?  Hgb Electro    ?  CF   ?   SMA   ?     ? ?  ?  ? ?  ? ?  ?Review of Systems: 10 point review of systems negative  unless otherwise noted in HPI ? ?Past Medical History: ?Patient Active Problem List  ? Diagnosis Date Noted  ? Supervision of high risk pregnancy, antepartum 12/06/2021  ?   ?Nursing Staff Provider  ?Office Location  [facility] Dating  5w77w6d24/23  ?Language  English Anatomy US [place]ormal female  ?Flu Vaccine   Genetic Screen  NIPS: female  ?TDaP vaccine    Hgb A1C or  ?GTT Early :5.2 ?Third trimester :   ?Covid    LAB RESULTS   ?Rhogam  NA Blood Type   AB+  ?Feeding Plan Bottle Antibody  negative  ?Contraception  Rubella  immune  ?Circumcision NA RPR   NR  ?Pediatrician   HBsAg   negative  ?Support Person  HIV  negative  ?Prenatal Classes  Varicella immune  ?  GBS  (For PCN allergy, check sensitivities)   ?BTL Consent   Has Hemoglobin C trait.  ?VBAC Consent  Pap 12/06/21 neg, hpv neg   ?  Hgb Electro    ?  CF   ?   SMA   ?     ? ?  ? Positive pregnancy test 11/01/2021  ?  11/01/2021 ? ?  ? Mood disorder ([place]) 04/26/2019  ?   ? ? ?  ? Morbid obesity with BMI of 40.0-44.9, adult ([place]) 04/08/2019  ?  Daily ASA advised. Anesthesia consult for epidural needed. ?Regular growth scans with MFM ? ? ?  ? 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 Iron deficiency anemia 03/14/2019  ? ? ?Past Surgical History: ?Past Surgical History:  ?Procedure Laterality Date  ? COLONOSCOPY WITH PROPOFOL N/A 06/15/2017  ? Procedure: COLONOSCOPY WITH PROPOFOL;  Surgeon: [name], MD;  [facility];  Service: Gastroenterology;  Laterality: N/A;  ? ESOPHAGOGASTRODUODENOSCOPY (EGD) WITH PROPOFOL N/A 06/15/2017  ? Procedure: ESOPHAGOGASTRODUODENOSCOPY (EGD) WITH PROPOFOL;  Surgeon: [name], MD;  [facility];  Service: Gastroenterology;  Laterality: N/A;  ? TONSILLECTOMY    ? ? ?Past Obstetric History: ?# 1 - Date: None, Sex: None, Weight: None, GA: None, Delivery: None, Apgar1: None, Apgar5: None, Living: None, Birth Comments: None ? ?# 2 - Date: 10/24/16, Sex: Female, Weight: 3230 g, GA: [redacted]w[redacted]d Delivery: Vaginal,  Spontaneous, Apgar1: 8, Apgar5: 9, Living: Living, Birth Comments: None ? ?# 3 - Date: 06/05/19, Sex: Female, Weight: 3030 g, GA: 356w1dDelivery: Vaginal, Spontaneous, Apgar1: 8, Apgar5: 9, Living: Living, Birth Comments: None ? ?# 4 - Date: None, Sex: None, Weight: None, GA: None, Delivery: None, Apgar1: None, Apgar5: None, Living: None, Birth Comments: None ? ? ?Past Gynecologic History: ? ?Family History: ?Family History  ?Problem Relation Age of Onset  ? Thyroid disease Mother   ? Hypertension Mother   ? Sickle cell trait Mother   ? Sickle cell anemia Sister   ? Hypertension Maternal Grandmother   ? ? ?Social History: ?Social History  ? ?Socioeconomic History  ? Marital status: Significant Other  ?  Spouse name: JaBridgett Larsson? Number of children: 1  ? Years of education: Not on file  ? Highest education level: 9th grade  ?Occupational History  ? Not on file  ?Tobacco Use  ? Smoking status: Never  ? Smokeless tobacco: Never  ?Vaping Use  ? Vaping Use: Never used  ?Substance and Sexual Activity  ? Alcohol use: No  ?  Comment: former  ? Drug use: Not Currently  ?  Types: Marijuana  ?  Comment: last used in sept  ? Sexual activity: Not Currently  ?  Partners: Male  ?  Birth control/protection: Implant  ?Other Topics Concern  ? Not on file  ?Social History Narrative  ? Not on file  ? ?Social Determinants of Health  ? ?Financial Resource Strain: Not on file  ?Food Insecurity: Not on file  ?Transportation Needs: Not on file  ?Physical Activity: Not on file  ?Stress: Not on file  ?Social Connections: Not on file  ?Intimate Partner Violence: Not on file  ? ? ?Medications: ?Prior to Admission medications   ?Medication Sig Start Date End Date Taking? Authorizing Provider  ?aspirin EC 81 MG tablet Take 81 mg by mouth daily. Swallow whole.   Yes [provider]  ?lidocaine (LIDODERM) 5 % Place 1 patch onto the skin every 12 (twelve) hours as needed for up to 10 days. Remove & Discard patch after 12 hours of  wear each day. 02/16/22 02/26/22  Menshew, JeDannielle KarvonenPA-C  ?metoCLOPramide (REGLAN) 10 MG tablet Take 1 tablet (10 mg total) by mouth 3 (three) times daily with meals for 10 days. ?Patient not taking: Reported on 02/18/2022 02/16/22 02/26/22  Menshew, JeDannielle KarvonenPA-C  ?ondansetron ([facility]4  MG tablet Take 4 mg by mouth every 8 (eight) hours as needed for nausea or vomiting. ?Patient not taking: Reported on 02/16/2022    [provider]  ?Prenatal Vit-Fe Fumarate-FA (PRENATAL MULTIVITAMIN) TABS tablet Take 1 tablet by mouth daily at 12 noon.    [provider]  ? ? ?Allergies: ?No Known Allergies ? ?Physical Exam: ?Vitals: Blood pressure 129/68, pulse 86, temperature 98.5 ?F (36.9 ?C), temperature source Oral, resp. rate 18, height 5' (1.524 m), weight 110.2 kg, last menstrual period 10/03/2021, unknown if currently breastfeeding. ? ?Urine Dip Protein: negative ? ?FHT: 140 ?Toco: none  ? ?General: NAD ?HEENT: normocephalic, anicteric ?Pulmonary: No increased work of breathing ?Cardiovascular: RRR, distal pulses 2+ ?Abdomen: Gravid, soft, pt calm during exam but reports she "can feel it" when asked if she is tender when palpated.  ?Genitourinary: NA ?Extremities: no edema, erythema, or tenderness ?Neurologic: Grossly intact ?Psychiatric: mood appropriate, affect full ? ?Labs: ?No results found for this or any previous visit (from the past 24 hour(s)). ? ?Assessment: 26 y.o. [id number] 65w3dby 07/05/2022, by Ultrasound with abdominal pain  ? ?Plan: ?1)Perocet ordered  ? ?2) Fetus -heart tones present  ? ?3) PNL - Blood type --/--/AB POS (04/17 0047) / Anti-bodyscreen NEG (04/17 0047) / Rubella 13.00 (03/24 1339) / Varicella Immune / RPR Non Reactive (03/24 1339) / HBsAg Negative (03/24 1339) / HIV Non Reactive (03/24 1339) / 1-hr OGTT NA / GBS   ? ?4) Immunization History -  ?There is no immunization history for the selected administration types on file for this patient. ? ?5) Disposition - discharge  home ? ?LRoberto ScalesCNM  ?[facility] [facility] ?02/18/2022, 4:42 AM ? ?  ?

## 2022-02-22 ENCOUNTER — Other Ambulatory Visit: Payer: Self-pay | Admitting: Licensed Practical Nurse

## 2022-02-22 NOTE — Progress Notes (Signed)
Needs anesthesia consult, order placed ?Roberto Scales, CNM  ?Mosetta Pigeon, [facility]  ?02/22/22  ?4:04 PM  ? ?

## 2022-02-23 ENCOUNTER — Telehealth: Payer: Self-pay

## 2022-02-23 ENCOUNTER — Telehealth: Payer: Self-pay | Admitting: Obstetrics and Gynecology

## 2022-02-23 NOTE — Telephone Encounter (Signed)
Cindy [name] desires genetic counseling by telephone only, as she does not have a phone with video capability at this time.  We scheduled a genetic counseling telehealth visit on Tuesday, 5/16 at 1pm to review her hemoglobin C trait results. ? ?We can be reached at [phone] with any questions or concerns. ? ?Wilburt Finlay, MS, CGC ? ?

## 2022-02-23 NOTE — Telephone Encounter (Signed)
Patient called baby is in a tight ball bringing a lot of pressure when she walks patient is miserable, said she is having Braxton Hicks contractions ? ?Patient wants to know if there is something she could do to get some relief or should she go to the ED to be checked out.  ?

## 2022-02-28 ENCOUNTER — Ambulatory Visit: Payer: Self-pay

## 2022-02-28 ENCOUNTER — Ambulatory Visit: Payer: Medicaid Other | Attending: Maternal & Fetal Medicine

## 2022-02-28 ENCOUNTER — Other Ambulatory Visit: Payer: Self-pay

## 2022-02-28 VITALS — BP 115/65 | HR 89 | Temp 98.3°F | Ht 60.0 in | Wt 243.0 lb

## 2022-02-28 DIAGNOSIS — Z3A21 21 weeks gestation of pregnancy: Secondary | ICD-10-CM | POA: Diagnosis not present

## 2022-02-28 DIAGNOSIS — O0990 Supervision of high risk pregnancy, unspecified, unspecified trimester: Secondary | ICD-10-CM

## 2022-02-28 DIAGNOSIS — D582 Other hemoglobinopathies: Secondary | ICD-10-CM

## 2022-02-28 DIAGNOSIS — O285 Abnormal chromosomal and genetic finding on antenatal screening of mother: Secondary | ICD-10-CM | POA: Diagnosis not present

## 2022-02-28 NOTE — Progress Notes (Addendum)
Referring physician: [facility]  ?Length of consultation: 40 minutes ? ?Ms. 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 She was present at this visit alone. ? ?Ms. 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 We discussed the natural history and autosomal recessive inheritance pattern associated with beta hemoglobinopathies. Approximately 1 in 40 individuals of African American ancestry has hemoglobin C trait. Individuals with hemoglobin C trait are not expected to have any health problems related to the trait. We reviewed general information about hemoglobin [place] disease and hemoglobin C disease (see below). These conditions are caused by a change in the hemoglobin. Hemoglobin is the substance in red blood cells that carries oxygen. When there is a change in the structure of the hemoglobin, there are differences in the way the blood carries oxygen. The changes that can occur in hemoglobin are caused by changes in the genetic instructions, or genes, which tell our bodies how to grow and develop. We have two copies of all of our genes; one is inherited from the mother and one from the father. For a child to have symptoms of these conditions, both parents must be carriers and both parents must pass on the changed gene to the child. When both parents are carriers, they have a 1 in 9 (or 25%) chance of having an affected child, a 1 in 2 chance for the child to be a carrier for a hemoglobin condition, such as sickle cell trait, and a 1 in 4 chance for the child to not inherit any copies of the changed gene for sickle cell. These risks are for each pregnancy.  ? ?In order to determine if a baby is at risk for inheriting sickle cell disease or another hemoglobinopathy, both parents must be tested for changes in the genes for hemoglobin using either DNA based testing or a  hemoglobin fractionation, performed in combination with a complete blood count. The older sickle cell solubility screening test is not able to tell all changes which may be present in the hemoglobin. Testing of her partner with a CBC and hemoglobin electrophoresis would confirm that he is not a carrier, in which case the baby is not at risk for sickle cell disease, or if he is a carrier, would allow for risk assessment for the pregnancy as described below: ?If Chelse's reproductive partner is found to be a carrier for Sickle Cell Disease (Hb A/S), there would be a 25% risk for their offspring together to be affected with Sickle-Hemoglobin C Disease (Hb S/C). Individuals with Sickle-Hemoglobin C Disease may have symptoms that are generally similar to but less severe than individuals affected with Sickle Cell Disease (Hb S/S). People with sickle cell disease are at an increased risk for infections, stroke, damage to certain organs, painful crises and other medical complications.  ?If Gaelle's reproductive partner is found to also be a carrier for Hemoglobin C trait (Hb A/C), there would be a 25% risk for their offspring together to be affected with Hemoglobin C Disease (Hb C/C). Individuals affected with Hemoglobin C Disease may have mild anemia, splenic enlargement, and jaundice. ?If Laquenta's reproductive partner is found to be a carrier of ? Thalassemia, there would be a 25% risk for their offspring together to be affected with Hemoglobin C ?-Thalassemia (Hb C/?+ or Hb C/??). Individuals with Hemoglobin C ?-Thalassemia typically have moderate to severe  anemia and splenic enlargement. ?Other ?-globin chain variants such as Hemoglobin O-Arab and Hemoglobin E can cause disease in an individual when inherited with Hemoglobin C trait. ? ?Teva stated that her partner, Vicente Serene (dob 12/04/1993) was tested for hemoglobinopathies in her last pregnancy and that his results were normal.  However, I cannot see those  results to confirm his testing.  I offered to try and obtain those results or to repeat his testing if desired, but she felt comfortable with her knowledge and declined additional testing on him at this time. We are happy to arrange for his testing or review his records if they become available.  If the father of the pregnancy were found to be a carrier for any of the above variants, then testing of the pregnancy through [facility] or amniocentesis would be made available. We also reviewed that ? Hemoglobinopathies are included in [place]'s newborn screening program. ? ?Lastly, we obtained a detailed family history and pregnancy history.  In the current pregnancy the patient reported no exposure to tobacco, alcohol, recreational drugs or ongoing prescription medications. She is taking a baby aspirin and was given a muscle relaxer for pain following a recent fall. She denied any complications in the pregnancy. This is the second pregnancy for this patient and her partner. They have a healthy 70-year-old daughter together. The patient also has a 47-year-old daughter from a prior relationship who is in good health with the exception of eczema and allergies.  The patient reported a maternal half sister with sickle cell disease who she helped to take care of while they were growing up, so she is very familiar with the health concerns associated with sickle cell disease.  She also reported a family history of [place] disorder in her father's sister and possibly her paternal grandmother.  The aunt passed away in her 24Q of complications from the disease including seizures and cognitive decline.  "[place] syndrome" is also known as Dentatorubral-pallidoluysian atrophy (DRPLA).  The gene is called ATN1, and creates a protein named atrophin 1 which is thought to be important in  the function of nerve cells in the brain. Over time, this changed protein builds up in the cells and they cannot function normally.  The type of  change (or variant) in the DNA is referred to as a trinucleotide repeat, which is a series of repeated DNA segments called a CAG repeat.  Copy numbers of 6 to 35 are normal.  Copy numbers greater than 48 are consistent with expected development of the disease.  Onset of symptoms varies greatly with DRPLA.  Generally speaking, the age of onset is related to the number of CAG repeats, with larger numbers of repeats resulting in earlier age of onset of symptoms.  The symptoms differ slightly based upon onset in childhood or as an adult.    When onset is prior to age 56, features are most often involuntary muscle movements, seizures, behavioral and developmental changes and ataxia (trouble with balance and walking).  When DRPLA begins after age 62, the most common features are ataxia, uncontrolled movements and psychiatric changes.  Because this trinucleotide repeat copy number may increase from generation to generation, most families show anticipation, or earlier age of onset of symptoms in successive generations.  Anticipation is more striking when the DRPLA gene is inherited from the father than the mother. This condition is passed down as a dominant trait, meaning if a parent has the condition, each of their children has a  50% chance for inheriting the gene change.  Darlin stated that her father was healthy prior to his death from an accident.  If he did not inherit this condition, then she and her children are not at risk.  If he happened to pass away before developing symptoms, it is possible he could have had the changed gene and passed it on.  If she would like to discuss this further, we are happy to arrange for her to speak with a medical genetics clinic.  She also reported that the father the baby had a paternal uncle and a maternal great uncle with intellectual disabilities, one possibly due to pregnancy complications.  Because there may be many reasons for intellectual disabilities, we would need additional  information in order to offer specific testing for this family.  Lastly, the father of the baby had a full sibling who passed away at birth due to structural anomalies which seem to have included gastroschi

## 2022-03-02 ENCOUNTER — Encounter: Payer: Medicaid Other | Admitting: Obstetrics and Gynecology

## 2022-03-06 ENCOUNTER — Telehealth: Payer: Self-pay

## 2022-03-06 NOTE — Telephone Encounter (Signed)
Left voice mail message requesting a return call.  

## 2022-03-06 NOTE — Telephone Encounter (Signed)
Spoke with patient. Advised that the August appointment is for Anesthesia consult (discussed details). Inquired if the patient had any thoughts of harming herself or others. Patient denied any suicidal/homicidal ideations. Advised if anything changes prior to her appointment on Thursday, for her to call our office. Patient agreed to complete a depression screen I will send her thru my chart.

## 2022-03-06 NOTE — Telephone Encounter (Signed)
Triage Voicemail: Patient received a my notification regarding an appointment in August. She's inquiring what this is for. She also wants to discuss her next appointment. She's going thru some real hard post partum despression. It's not getting any better. Cb [phone]

## 2022-03-07 NOTE — Telephone Encounter (Signed)
Patient is scheduled for 03/09/22 with Dr. Sharlet Salina

## 2022-03-09 ENCOUNTER — Encounter: Payer: Medicaid Other | Admitting: Obstetrics

## 2022-03-10 ENCOUNTER — Ambulatory Visit (INDEPENDENT_AMBULATORY_CARE_PROVIDER_SITE_OTHER): Payer: Medicaid Other | Admitting: Advanced Practice Midwife

## 2022-03-10 ENCOUNTER — Encounter: Payer: Self-pay | Admitting: Advanced Practice Midwife

## 2022-03-10 VITALS — BP 110/70 | Wt 246.0 lb

## 2022-03-10 DIAGNOSIS — Z369 Encounter for antenatal screening, unspecified: Secondary | ICD-10-CM

## 2022-03-10 DIAGNOSIS — O99212 Obesity complicating pregnancy, second trimester: Secondary | ICD-10-CM

## 2022-03-10 DIAGNOSIS — Z113 Encounter for screening for infections with a predominantly sexual mode of transmission: Secondary | ICD-10-CM

## 2022-03-10 DIAGNOSIS — Z3A23 23 weeks gestation of pregnancy: Secondary | ICD-10-CM

## 2022-03-10 DIAGNOSIS — O0992 Supervision of high risk pregnancy, unspecified, second trimester: Secondary | ICD-10-CM

## 2022-03-10 DIAGNOSIS — F32A Depression, unspecified: Secondary | ICD-10-CM

## 2022-03-10 DIAGNOSIS — O99340 Other mental disorders complicating pregnancy, unspecified trimester: Secondary | ICD-10-CM

## 2022-03-10 DIAGNOSIS — F419 Anxiety disorder, unspecified: Secondary | ICD-10-CM

## 2022-03-10 DIAGNOSIS — Z131 Encounter for screening for diabetes mellitus: Secondary | ICD-10-CM

## 2022-03-10 DIAGNOSIS — Z130 Encounter for screening for diseases of the blood and blood-forming organs and certain disorders involving the immune mechanism: Secondary | ICD-10-CM

## 2022-03-10 DIAGNOSIS — O99210 Obesity complicating pregnancy, unspecified trimester: Secondary | ICD-10-CM | POA: Insufficient documentation

## 2022-03-10 MED ORDER — SERTRALINE HCL 25 MG PO TABS: 25.0000 mg | ORAL_TABLET | Freq: Every day | ORAL | 5 refills | Status: DC

## 2022-03-10 NOTE — Progress Notes (Signed)
ROB- vaginal pressure when eating or moving around x 2 weeks

## 2022-03-10 NOTE — Patient Instructions (Signed)
Managing Anxiety, Adult After being diagnosed with anxiety, you may be relieved to know why you have felt or behaved a certain way. You may also feel overwhelmed about the treatment ahead and what it will mean for your life. With care and support, you can manage this condition. How to manage lifestyle changes Managing stress and anxiety  Stress is your body's reaction to life changes and events, both good and bad. Most stress will last just a few hours, but stress can be ongoing and can lead to more than just stress. Although stress can play a major role in anxiety, it is not the same as anxiety. Stress is usually caused by something external, such as a deadline, test, or competition. Stress normally passes after the triggering event has ended.  Anxiety is caused by something internal, such as imagining a terrible outcome or worrying that something will go wrong that will devastate you. Anxiety often does not go away even after the triggering event is over, and it can become long-term (chronic) worry. It is important to understand the differences between stress and anxiety and to manage your stress effectively so that it does not lead to an anxious response. Talk with your health care provider or a counselor to learn more about reducing anxiety and stress. He or she may suggest tension reduction techniques, such as: Music therapy. Spend time creating or listening to music that you enjoy and that inspires you. Mindfulness-based meditation. Practice being aware of your normal breaths while not trying to control your breathing. It can be done while sitting or walking. Centering prayer. This involves focusing on a word, phrase, or sacred image that means something to you and brings you peace. Deep breathing. To do this, expand your stomach and inhale slowly through your nose. Hold your breath for 3-5 seconds. Then exhale slowly, letting your stomach muscles relax. Self-talk. Learn to notice and identify  thought patterns that lead to anxiety reactions and change those patterns to thoughts that feel peaceful. Muscle relaxation. Taking time to tense muscles and then relax them. Choose a tension reduction technique that fits your lifestyle and personality. These techniques take time and practice. Set aside 5-15 minutes a day to do them. Therapists can offer counseling and training in these techniques. The training to help with anxiety may be covered by some insurance plans. Other things you can do to manage stress and anxiety include: Keeping a stress diary. This can help you learn what triggers your reaction and then learn ways to manage your response. Thinking about how you react to certain situations. You may not be able to control everything, but you can control your response. Making time for activities that help you relax and not feeling guilty about spending your time in this way. Doing visual imagery. This involves imagining or creating mental pictures to help you relax. Practicing yoga. Through yoga poses, you can lower tension and promote relaxation.  Medicines Medicines can help ease symptoms. Medicines for anxiety include: Antidepressant medicines. These are usually prescribed for long-term daily control. Anti-anxiety medicines. These may be added in severe cases, especially when panic attacks occur. Medicines will be prescribed by a health care provider. When used together, medicines, psychotherapy, and tension reduction techniques may be the most effective treatment. Relationships Relationships can play a big part in helping you recover. Try to spend more time connecting with trusted friends and family members. Consider going to couples counseling if you have a partner, taking family education classes, or going to   family therapy. Therapy can help you and others better understand your condition. How to recognize changes in your anxiety Everyone responds differently to treatment for  anxiety. Recovery from anxiety happens when symptoms decrease and stop interfering with your daily activities at home or work. This may mean that you will start to: Have better concentration and focus. Worry will interfere less in your daily thinking. Sleep better. Be less irritable. Have more energy. Have improved memory. It is also important to recognize when your condition is getting worse. Contact your health care provider if your symptoms interfere with home or work and you feel like your condition is not improving. Follow these instructions at home: Activity Exercise. Adults should do the following: Exercise for at least 150 minutes each week. The exercise should increase your heart rate and make you sweat (moderate-intensity exercise). Strengthening exercises at least twice a week. Get the right amount and quality of sleep. Most adults need 7-9 hours of sleep each night. Lifestyle  Eat a healthy diet that includes plenty of vegetables, fruits, whole grains, low-fat dairy products, and lean protein. Do not eat a lot of foods that are high in fats, added sugars, or salt (sodium). Make choices that simplify your life. Do not use any products that contain nicotine or tobacco. These products include cigarettes, chewing tobacco, and vaping devices, such as e-cigarettes. If you need help quitting, ask your health care provider. Avoid caffeine, alcohol, and certain over-the-counter cold medicines. These may make you feel worse. Ask your pharmacist which medicines to avoid. General instructions Take over-the-counter and prescription medicines only as told by your health care provider. Keep all follow-up visits. This is important. Where to find support You can get help and support from these sources: Self-help groups. Online and community organizations. A trusted spiritual leader. Couples counseling. Family education classes. Family therapy. Where to find more information You may find  that joining a support group helps you deal with your anxiety. The following sources can help you locate counselors or support groups near you: Mental Health America: www.mentalhealthamerica.net Anxiety and Depression Association of America (ADAA): www.adaa.org National Alliance on Mental Illness (NAMI): www.nami.org Contact a health care provider if: You have a hard time staying focused or finishing daily tasks. You spend many hours a day feeling worried about everyday life. You become exhausted by worry. You start to have headaches or frequently feel tense. You develop chronic nausea or diarrhea. Get help right away if: You have a racing heart and shortness of breath. You have thoughts of hurting yourself or others. If you ever feel like you may hurt yourself or others, or have thoughts about taking your own life, get help right away. Go to your nearest emergency department or: Call your local emergency services (911 in the [place] Call a suicide crisis helpline, such as the National Suicide Prevention Lifeline at 1-800-273-8255 or 988 in the [place] This is open 24 hours a day in the [place] Text the Crisis Text Line at 741741 (in the [place] Summary Taking steps to learn and use tension reduction techniques can help calm you and help prevent triggering an anxiety reaction. When used together, medicines, psychotherapy, and tension reduction techniques may be the most effective treatment. Family, friends, and partners can play a big part in supporting you. This information is not intended to replace advice given to you by your health care provider. Make sure you discuss any questions you have with your health care provider. Document Revised: 04/27/2021 Document Reviewed: 01/23/2021 Elsevier   Patient Education  [place] Depression, Adult Depression is a mental health condition that affects your thoughts, feelings, and actions. Being diagnosed with depression can bring you relief  if you did not know why you have felt or behaved a certain way. It could also leave you feeling overwhelmed with uncertainty about your future. Preparing yourself to manage your symptoms can help you feel more positive about your future. How to manage lifestyle changes Managing stress  Stress is your body's reaction to life changes and events, both good and bad. Stress can add to your feelings of depression. Learning to manage your stress can help lessen your feelings of depression. Try some of the following approaches to reducing your stress (stress reduction techniques): Listen to music that you enjoy and that inspires you. Try using a meditation app or take a meditation class. Develop a practice that helps you connect with your spiritual self. Walk in nature, pray, or go to a place of worship. Do some deep breathing. To do this, inhale slowly through your nose. Pause at the top of your inhale for a few seconds and then exhale slowly, letting your muscles relax. Practice yoga to help relax and work your muscles. Choose a stress reduction technique that suits your lifestyle and personality. These techniques take time and practice to develop. Set aside 5-15 minutes a day to do them. Therapists can offer training in these techniques. Other things you can do to manage stress include: Keeping a stress diary. Knowing your limits and saying no when you think something is too much. Paying attention to how you react to certain situations. You may not be able to control everything, but you can change your reaction. Adding humor to your life by watching funny films or TV shows. Making time for activities that you enjoy and that relax you.  Medicines Medicines, such as antidepressants, are often a part of treatment for depression. Talk with your pharmacist or health care provider about all the medicines, supplements, and herbal products that you take, their possible side effects, and what medicines and  other products are safe to take together. Make sure to report any side effects you may have to your health care provider. Relationships Your health care provider may suggest family therapy, couples therapy, or individual therapy as part of your treatment. How to recognize changes Everyone responds differently to treatment for depression. As you recover from depression, you may start to: Have more interest in doing activities. Feel less hopeless. Have more energy. Overeat less often, or have a better appetite. Have better mental focus. It is important to recognize if your depression is not getting better or is getting worse. The symptoms you had in the beginning may return, such as: Tiredness (fatigue) or low energy. Eating too much or too little. Sleeping too much or too little. Feeling restless, agitated, or hopeless. Trouble focusing or making decisions. Unexplained physical complaints. Feeling irritable, angry, or aggressive. If you or your family members notice these symptoms coming back, let your health care provider know right away. Follow these instructions at home: Activity  Try to get some form of exercise each day, such as walking, biking, swimming, or lifting weights. Practice stress reduction techniques. Engage your mind by taking a class or doing some volunteer work. Lifestyle Get the right amount and quality of sleep. Cut down on using caffeine, tobacco, alcohol, and other potentially harmful substances. Eat a healthy diet that includes plenty of vegetables, fruits, whole grains, low-fat dairy products,  and lean protein. Do not eat a lot of foods that are high in solid fats, added sugars, or salt (sodium). General instructions Take over-the-counter and prescription medicines only as told by your health care provider. Keep all follow-up visits as told by your health care provider. This is important. Where to find support Talking to others  Friends and family members  can be sources of support and guidance. Talk to trusted friends or family members about your condition. Explain your symptoms to them, and let them know that you are working with a health care provider to treat your depression. Tell friends and family members how they also can be helpful. Finances Find appropriate mental health providers that fit with your financial situation. Talk with your health care provider about options to get reduced prices on your medicines. Where to find more information You can find support in your area from: Anxiety and Depression Association of America (ADAA): www.adaa.org Mental Health America: www.mentalhealthamerica.net [employer] on Mental Illness: www.nami.org Contact a health care provider if: You stop taking your antidepressant medicines, and you have any of these symptoms: Nausea. Headache. Light-headedness. Chills and body aches. Not being able to sleep (insomnia). You or your friends and family think your depression is getting worse. Get help right away if: You have thoughts of hurting yourself or others. If you ever feel like you may hurt yourself or others, or have thoughts about taking your own life, get help right away. Go to your nearest emergency department or: Call your local emergency services (911 in the [place] Call a suicide crisis helpline, such as the [place] at (207)317-3160 or 988 in the [place]. This is open 24 hours a day in the [place] Text the Crisis Text Line at 217-355-7187 (in the [place].). Summary If you are diagnosed with depression, preparing yourself to manage your symptoms is a good way to feel positive about your future. Work with your health care provider on a management plan that includes stress reduction techniques, medicines (if applicable), therapy, and healthy lifestyle habits. Keep talking with your health care provider about how your treatment is working. If you have thoughts about taking your  own life, call a suicide crisis helpline or text a crisis text line. This information is not intended to replace advice given to you by your health care provider. Make sure you discuss any questions you have with your health care provider. Document Revised: 04/27/2021 Document Reviewed: 08/13/2019 Elsevier Patient Education  [place] Mood and Anxiety Disorder Perinatal mood and anxiety disorder (PMAD) is a mental health condition that happens when a person feels excessive sadness, anger, or worry and tension (anxiety) during pregnancy or during the first few months after the birth. This condition can last a few months or may continue for years if left untreated. PMAD may cause serious problems for the mother, her baby, or the father if not properly managed. Depression and anxiety can interfere with the ability to take care of the baby. It also may affect work, school, relationships, and other everyday activities. Having the baby blues is considered normal. Mild to moderate levels of sadness, exhaustion, and generally struggling with being a parent are considered the blues. Many parents experience these during the first 1-2 weeks after giving birth. If these symptoms become worse or last too long, it may be PMAD. What are the causes? The exact cause of this condition is not known. It may result from a combination of hormone changes and biological,  social, and psychological factors. What increases the risk? The following factors may make you more likely to develop this condition: Having a personal or family history of depression, anxiety, or mood disorders. Experiencing a stressful life event during pregnancy, such as the death of a loved one. Having additional life stress, such as being a single parent. Having thyroid problems. What are the signs or symptoms? Symptoms of this condition include: Physical symptoms, such as: Panic attacks. These are intense episodes of fear or  discomfort that may also cause sweating, nausea, shortness of breath, or fear of dying. They usually last 5-15 minutes but can last longer. Performing repetitive tasks to relieve stress or worry (obsessive compulsive disorder, or OCD). Problems with appetite or sleep. Emotional symptoms, such as: Excessive worry about problems or feeling like something bad will happen (generalized anxiety disorder). Phobias, which are fears of certain objects or situations. Separation anxiety, or fear and stress about leaving certain people or loved ones. Behavioral symptoms, such as: Depression, or lack of motivation and energy. Intense mood swings involving emotional highs and lows. Feeling out of control or like you are going crazy. Having difficulty bonding with your baby. Some people also have trouble relaxing, problems concentrating, problems sleeping, frequent nightmares, and disturbing thoughts. PMAD can be different for everyone and can affect men as well as women. How is this diagnosed? This condition is diagnosed based on a physical exam and mental evaluation. In some cases, your health care provider may use an anxiety or depression screening tool. This includes a list of questions that can help a health care provider diagnose PMAD. You may be referred to a mental health expert who specializes in treating PMAD. How is this treated? This condition may be treated with: Talk therapy with a mental health professional. This may be family therapy, marriage therapy, cognitive behavioral therapy, or interpersonal therapy. Medicines. Your health care provider will discuss medicines that are safe to use during pregnancy and breastfeeding. Stress reduction therapies, such as mindfulness, deep breathing, or guided muscle relaxation. Support groups, early childhood education, or other groups to help with being a parent. Follow these instructions at home: Lifestyle Do not use any products that contain nicotine  or tobacco. These products include cigarettes, chewing tobacco, and vaping devices, such as e-cigarettes. If you need help quitting, ask your health care provider. Do not drink alcohol when you are pregnant. It is also safest not to drink alcohol if you are breastfeeding. After your baby is born, if you drink alcohol: Limit how much you have to 0-1 drink a day. Be aware of how much alcohol is in your drink. In the [place] one drink equals one 12 oz bottle of beer (355 mL), one 5 oz glass of wine (148 mL), or one 1 oz glass of hard liquor (44 mL). Consider joining a support group for new mothers. Ask your health care provider for recommendations. Take good care of yourself. Make sure you: Get as much rest as possible. Talk with your partner about sharing the responsibility of getting up with your baby if possible. Make sleep a priority. Eat a healthy diet. This includes plenty of fruits and vegetables, whole grains, and lean proteins. Exercise regularly, as told by your health care provider. Ask your health care provider what exercises are safe for you. Talk with your partner about making sure you both have opportunities to exercise. General instructions Take over-the-counter and prescription medicines only as told by your health care provider. Talk with your partner  or family members about your feelings during pregnancy. Share your concerns, needs, or anxieties with each other. Do not be afraid to ask for help. Find a mental health professional, if needed. Ask for help with tasks or chores when you need it. Ask friends and family members to provide meals, watch your children, or help with cleaning. If friends or family are not able to help, consider finding a licensed child care provider or professional house cleaner if needed. Let your partner know what you need. He or she may be struggling too. Keep all follow-up visits. This is important. Contact a health care provider if: You or people close to  you notice that you have symptoms of anxiety or depression. Your symptoms of anxiety or depression get worse. You take medicines and have side effects that are uncomfortable or difficult to tolerate. Get help right away if: You feel like hurting yourself, your baby, or someone else. If you feel like you may hurt yourself or others, or have thoughts about taking your own life, get help right away. Go to your nearest emergency department or: Call your local emergency services (911 in the [place] Call a suicide crisis helpline, such as the [place] at 907-325-4776 or 988 in the [place]. This is open 24 hours a day in the [place] Text the Crisis Text Line at (614)415-1083 (in the [place].). Summary Perinatal mood and anxiety disorder (PMAD) is when a woman or her partner feels excessive sadness, anger, or worry and tension (anxiety) during pregnancy or during the first few months after the birth. PMAD may include depression, intense mood swings, panic attacks, separation anxiety, phobias, or generalized anxiety. PMAD can cause problems for the mother, the baby, or the father if not properly managed. This condition is treated with medicines, talk therapy, stress reduction therapies, or a combination of treatments. Talk with your partner or family members about your concerns or fears. Ask for help when you need it. This information is not intended to replace advice given to you by your health care provider. Make sure you discuss any questions you have with your health care provider. Document Revised: 04/27/2021 Document Reviewed: 03/26/2020 Elsevier Patient Education  [place].

## 2022-03-10 NOTE — Progress Notes (Signed)
Routine Prenatal Care Visit  Subjective  Cindy [name] is a 26 y.o. G4P2002 at 75w2dbeing seen today for ongoing prenatal care.  She is currently monitored for the following issues for this high-risk pregnancy and has Iron deficiency anemia; Morbid obesity with BMI of 40.0-44.9, adult ([place]; Hemoglobin C trait ([place]; Mood disorder ([facility] [place]; Positive pregnancy test; Supervision of high risk pregnancy, antepartum; Abdominal pain; and Obesity affecting pregnancy on their problem list.  ----------------------------------------------------------------------------------- Patient reports  ongoing and worsening depression and anxiety in the past 6 weeks. She has a history of postpartum depression. She does not have a good support system except for her partner. She sometimes has difficulty getting out of bed and is only motivated because of her other children. We discussed the importance of adequate sleep and support. She is encouraged to find a supportive group of friends such as a mom's group. She would like to start on a low dose of zoloft which she took briefly after the birth of her daughter .   Contractions: Not present. Vag. Bleeding: None.  Movement: Present. Leaking Fluid denies.  ----------------------------------------------------------------------------------- The following portions of the patient's history were reviewed and updated as appropriate: allergies, current medications, past family history, past medical history, past social history, past surgical history and problem list. Problem list updated.  Objective  Blood pressure 110/70, weight 246 lb (111.6 kg), last menstrual period 10/03/2021 Pregravid weight 230 lb (104.3 kg) Total Weight Gain 16 lb (7.258 kg) Urinalysis: Urine Protein    Urine Glucose    Fetal Status: Fetal Heart Rate (bpm): 145 Fundal Height: 25 cm Movement: Present     General:  Alert, oriented and cooperative. Patient is in no acute distress.  Skin: Skin is warm and dry.  No rash noted.   Cardiovascular: Normal heart rate noted  Respiratory: Normal respiratory effort, no problems with respiration noted  Abdomen: Soft, gravid, appropriate for gestational age. Pain/Pressure: Absent     Pelvic:  Cervical exam deferred        Extremities: Normal range of motion.  Edema: None  Mental Status: Normal mood and affect. Normal behavior. Normal judgment and thought content.      03/10/2022    2:32 PM 08/27/2020    9:05 AM  Depression screen PHQ 2/9  Decreased Interest 2 0  Down, Depressed, Hopeless 2 1  PHQ - 2 Score 4 1  Altered sleeping 2 1  Tired, decreased energy 2 1  Change in appetite 1 1  Feeling bad or failure about yourself  2 1  Trouble concentrating 1 0  Moving slowly or fidgety/restless 2 0  Suicidal thoughts  0  PHQ-9 Score 14 5  Difficult doing work/chores Not difficult at all       03/10/2022    2:32 PM 08/27/2020   10:02 AM  GAD 7 : Generalized Anxiety Score  Nervous, Anxious, on Edge 2 1  Control/stop worrying 2 1  Worry too much - different things 2 1  Trouble relaxing 2 1  Restless 2 0  Easily annoyed or irritable 2 1  Afraid - awful might happen 2 1  Total GAD 7 Score 14 6  Anxiety Difficulty Somewhat difficult       Assessment   26y.o. [id number] 21w2dy  07/05/2022, by Ultrasound presenting for routine prenatal visit  Plan   pregnancy Problems (from 12/06/21 to present)     Problem Noted Resolved   Obesity affecting pregnancy 03/10/2022 by GlRod CanCNM No  Supervision of high risk pregnancy, antepartum 12/06/2021 by Imagene Riches, CNM No   Overview Addendum 02/17/2022 12:27 AM by Imagene Riches, CNM     Nursing Staff Provider  Office Location  [facility] Dating  9w6d1/24/23  Language  English Anatomy [place] Normal female  Flu Vaccine   Genetic Screen  NIPS: female  TDaP vaccine    Hgb A1C or  GTT Early :5.2 Third trimester :   Covid    LAB RESULTS   Rhogam  NA Blood Type   AB+  Feeding Plan Bottle  Antibody  negative  Contraception  Rubella  immune  Circumcision NA RPR   NR  Pediatrician   HBsAg   negative  Support Person  HIV  negative  Prenatal Classes  Varicella immune    GBS  (For PCN allergy, check sensitivities)   BTL Consent   Has Hemoglobin C trait.  VBAC Consent  Pap 12/06/21 neg, hpv neg     Hgb Electro      CF      SMA                    Preterm labor symptoms and general obstetric precautions including but not limited to vaginal bleeding, contractions, leaking of fluid and fetal movement were reviewed in detail with the patient. Please refer to After Visit Summary for other counseling recommendations.   Return in about 4 weeks (around 04/07/2022) for 28w labs and rob.  JRod Can CNM 03/10/2022 2:44 PM

## 2022-03-14 ENCOUNTER — Other Ambulatory Visit: Payer: Self-pay

## 2022-03-14 DIAGNOSIS — O99212 Obesity complicating pregnancy, second trimester: Secondary | ICD-10-CM

## 2022-03-14 DIAGNOSIS — D582 Other hemoglobinopathies: Secondary | ICD-10-CM

## 2022-03-14 DIAGNOSIS — D509 Iron deficiency anemia, unspecified: Secondary | ICD-10-CM

## 2022-03-16 ENCOUNTER — Ambulatory Visit: Payer: Medicaid Other

## 2022-03-20 ENCOUNTER — Telehealth: Payer: Self-pay

## 2022-03-20 NOTE — Telephone Encounter (Signed)
Pt called back. Says the swelling started about one month ago, didn't mention anything at her last visit because she wasn't having any swelling. Denies headaches, blurred vision or any other sx. She elevates her legs a couple times  a day for about 15 minutes. Given no other sx can we monitor sx or does she need to be seen?  Nurse visit BP check? She doesn't have a BP cuff at home. Pls advise.

## 2022-03-20 NOTE — Telephone Encounter (Signed)
Pt left msg on my extension saying for the past few days she has been having ankle/feet swelling, that she's afraid she might have preclampsia and that she already takes baby aspirin. Called her back to get more info, no answer, LVMTRC.

## 2022-03-21 NOTE — Telephone Encounter (Signed)
Pt aware.

## 2022-03-25 ENCOUNTER — Observation Stay
Admission: EM | Admit: 2022-03-25 | Discharge: 2022-03-25 | Disposition: A | Discharge status: Home / Self Care | Payer: Medicaid Other | Attending: Certified Nurse Midwife | Admitting: Certified Nurse Midwife

## 2022-03-25 ENCOUNTER — Encounter: Payer: Self-pay | Admitting: Obstetrics and Gynecology

## 2022-03-25 ENCOUNTER — Other Ambulatory Visit: Payer: Self-pay

## 2022-03-25 DIAGNOSIS — O26892 Other specified pregnancy related conditions, second trimester: Secondary | ICD-10-CM

## 2022-03-25 DIAGNOSIS — O4702 False labor before 37 completed weeks of gestation, second trimester: Principal | ICD-10-CM | POA: Insufficient documentation

## 2022-03-25 DIAGNOSIS — Z3A25 25 weeks gestation of pregnancy: Secondary | ICD-10-CM | POA: Insufficient documentation

## 2022-03-25 DIAGNOSIS — O0990 Supervision of high risk pregnancy, unspecified, unspecified trimester: Secondary | ICD-10-CM

## 2022-03-25 DIAGNOSIS — R109 Unspecified abdominal pain: Secondary | ICD-10-CM

## 2022-03-25 DIAGNOSIS — Z7982 Long term (current) use of aspirin: Secondary | ICD-10-CM | POA: Diagnosis not present

## 2022-03-25 DIAGNOSIS — R102 Pelvic and perineal pain: Secondary | ICD-10-CM

## 2022-03-25 DIAGNOSIS — O99212 Obesity complicating pregnancy, second trimester: Secondary | ICD-10-CM

## 2022-03-25 LAB — URINALYSIS, ROUTINE W REFLEX MICROSCOPIC
Bilirubin Urine: NEGATIVE
Glucose, UA: NEGATIVE mg/dL
Hgb urine dipstick: NEGATIVE
Ketones, ur: 80 mg/dL — AB
Leukocytes,Ua: NEGATIVE
Nitrite: NEGATIVE
Protein, ur: NEGATIVE mg/dL
Specific Gravity, Urine: 1.024 (ref 1.005–1.030)
pH: 6 (ref 5.0–8.0)

## 2022-03-25 MED ORDER — ACETAMINOPHEN 500 MG PO TABS
1000.0000 mg | ORAL_TABLET | Freq: Once | ORAL | Status: AC
  Administered 2022-03-25: 1000 mg via ORAL
  Filled 2022-03-25: qty 2

## 2022-03-25 NOTE — Progress Notes (Signed)
Discharged home. Discharge instructions reviewed. Left floor ambulatory with significant other. Cindy [name]

## 2022-03-25 NOTE — OB Triage Note (Signed)
    L&D OB Triage Note  SUBJECTIVE Cindy [name] is a 26 y.o. G60P2002 female at [redacted]w[redacted]d EDD Estimated Date of Delivery: 07/05/22 who presented to triage with complaints of vaginal pressure and occasional contractions. She denies loss of fluid, vaginal bleeding and is feeling good movement.   OB History  Gravida Para Term Preterm AB Living  '4 2 2 '$ 0 0 2  SAB IAB Ectopic Multiple Live Births  0 0 0 0 2    # Outcome Date GA Lbr Len/2nd Weight Sex Delivery Anes PTL Lv  4 Current           3 Term 06/05/19 335w1d03:54 / 03:46 3030 g F Vag-Spont EPI  LIV     Name: Cindy [name],[name]     Apgar1: 8  Apgar5: 9  2 Term 10/24/16 4043w5d:53 / 00:07 3230 g F Vag-Spont EPI  LIV     Name: Cindy [name],Cindy [name]     Apgar1: 8  Apgar5: 9  1 Gravida             Medications Prior to Admission  Medication Sig Dispense Refill Last Dose   aspirin EC 81 MG tablet Take 81 mg by mouth daily. Swallow whole.   03/25/2022   ondansetron (ZOFRAN-ODT) 4 MG disintegrating tablet DISSOLVE 1 TABLET IN MOUTH EVERY 8 HOURS AS NEEDED FOR NAUSEA OR VOMITING   Past Month   Prenatal Vit-Fe Fumarate-FA (PRENATAL MULTIVITAMIN) TABS tablet Take 1 tablet by mouth daily at 12 noon.   Past Week   sertraline (ZOLOFT) 25 MG tablet Take 1 tablet (25 mg total) by mouth daily. (Patient not taking: Reported on 03/25/2022) 30 tablet 5 Not Taking     OBJECTIVE  Nursing Evaluation:   BP 124/67 (BP Location: Left Arm)   Pulse 82   Temp 98.4 F (36.9 C) (Oral)   Resp 18   Ht 5' (1.524 m)   Wt 104.3 kg   LMP 10/03/2021 (Exact Date)   BMI 44.92 kg/m    Findings:   Round ligament pain       NST was performed and has been reviewed by me.  NST INTERPRETATION: Category I and appropriate for gestational age  Mode: External Baseline Rate (A): 150 bpm (fht) Variability: Moderate Accelerations: 10 x 10 Decelerations: absent      Contraction Frequency (min): none  ASSESSMENT Impression:  1.  Pregnancy:  [id number] 25w47w3dDD  Estimated Date of Delivery: 07/05/22 2.  Reassuring fetal and maternal status 3. Round ligament pain.   PLAN 1. Current condition and above findings reviewed.  Reassuring fetal and maternal condition.  Improvement of discomfort with tylenol. Encourage use of belly band.  2. Discharge home with standard labor precautions given to return to L&D or call the office for problems. 3. Continue routine prenatal care.     AnniPhilip AspenM

## 2022-03-25 NOTE — OB Triage Note (Addendum)
Pt presents to L/D triage with reported intermittent contractions and constant vaginal pressure that began at 0630 this morning. She rates the pain 8/10. Pt reports no bleeding or LOF and positive fetal movement. Pt reports no urinary symptoms or recent intercourse. She describes clear "normal" discharge. Monitors applied and assessing- initial FHT 140. VSS.  Pt reports taking muscle relaxer yesterday.

## 2022-03-28 ENCOUNTER — Encounter: Payer: Self-pay | Admitting: Obstetrics

## 2022-03-28 DIAGNOSIS — R87613 High grade squamous intraepithelial lesion on cytologic smear of cervix (HGSIL): Secondary | ICD-10-CM

## 2022-03-28 HISTORY — DX: High grade squamous intraepithelial lesion on cytologic smear of cervix (HGSIL): R87.613

## 2022-04-07 ENCOUNTER — Ambulatory Visit (INDEPENDENT_AMBULATORY_CARE_PROVIDER_SITE_OTHER): Payer: Medicaid Other | Admitting: Obstetrics

## 2022-04-07 ENCOUNTER — Other Ambulatory Visit: Payer: Medicaid Other

## 2022-04-07 ENCOUNTER — Encounter: Payer: Self-pay | Admitting: Obstetrics

## 2022-04-07 VITALS — BP 122/70 | Wt 242.6 lb

## 2022-04-07 DIAGNOSIS — Z113 Encounter for screening for infections with a predominantly sexual mode of transmission: Secondary | ICD-10-CM

## 2022-04-07 DIAGNOSIS — O99212 Obesity complicating pregnancy, second trimester: Secondary | ICD-10-CM

## 2022-04-07 DIAGNOSIS — Z131 Encounter for screening for diabetes mellitus: Secondary | ICD-10-CM

## 2022-04-07 DIAGNOSIS — Z23 Encounter for immunization: Secondary | ICD-10-CM | POA: Diagnosis not present

## 2022-04-07 DIAGNOSIS — O26843 Uterine size-date discrepancy, third trimester: Secondary | ICD-10-CM | POA: Insufficient documentation

## 2022-04-07 DIAGNOSIS — O1200 Gestational edema, unspecified trimester: Secondary | ICD-10-CM | POA: Insufficient documentation

## 2022-04-07 DIAGNOSIS — Z3A27 27 weeks gestation of pregnancy: Secondary | ICD-10-CM | POA: Diagnosis not present

## 2022-04-07 DIAGNOSIS — O0992 Supervision of high risk pregnancy, unspecified, second trimester: Secondary | ICD-10-CM

## 2022-04-07 DIAGNOSIS — D582 Other hemoglobinopathies: Secondary | ICD-10-CM

## 2022-04-07 DIAGNOSIS — Z130 Encounter for screening for diseases of the blood and blood-forming organs and certain disorders involving the immune mechanism: Secondary | ICD-10-CM

## 2022-04-07 DIAGNOSIS — Z1331 Encounter for screening for depression: Secondary | ICD-10-CM

## 2022-04-07 DIAGNOSIS — Z369 Encounter for antenatal screening, unspecified: Secondary | ICD-10-CM

## 2022-04-07 DIAGNOSIS — O0970 Supervision of high risk pregnancy due to social problems, unspecified trimester: Secondary | ICD-10-CM

## 2022-04-07 LAB — POCT URINALYSIS DIPSTICK OB
Glucose, UA: NEGATIVE
POC,PROTEIN,UA: NEGATIVE

## 2022-04-07 NOTE — Progress Notes (Signed)
Subjective: Patient returns for pregnancy follow-up. Cindy [name] has no concerns.  Cindy [name] reports good fetal movement, denies vaginal bleeding or leakage of fluid. Cindy [name] denies contractions. Cindy [name] denies urinary symptoms. Denies headaches, blurred vision or epigastric pain. Reports swelling.   Objective: BP 122/70   Wt 242 lb 9.6 oz (110 kg)   LMP 10/03/2021 (Exact Date)   BMI 47.38 kg/m  Physical Exam Vitals and nursing note reviewed.  Constitutional:      Appearance: Normal appearance.  HENT:     Head: Normocephalic and atraumatic.  Eyes:     Extraocular Movements: Extraocular movements intact.  Pulmonary:     Effort: Pulmonary effort is normal.  Abdominal:     Palpations: Mass: gravid FH 36.  Musculoskeletal:        General: Normal range of motion.     Cervical back: Normal range of motion.     Right lower leg: Edema present.     Left lower leg: Edema present.  Skin:    General: Skin is warm and dry.  Neurological:     General: No focal deficit present.     Mental Status: Cindy [name] is alert and oriented to person, place, and time.  Psychiatric:        Mood and Affect: Mood normal.        Behavior: Behavior normal.        Thought Content: Thought content normal.       04/07/2022    3:09 PM 03/10/2022    2:32 PM 08/27/2020    9:05 AM  PHQ9 SCORE ONLY  PHQ-9 Total Score 15 14 5    Assessment and Plan:  Patient Cindy [name] is an 26 y.o. year old G45P2002 at [redacted]w[redacted]d with EDC of Estimated Date of Delivery: 07/05/22 with [redacted] weeks gestation of pregnancy - Plan: POC Urinalysis Dipstick OB, Tdap vaccine greater than or equal to 7yo IM  Need for diphtheria-tetanus-pertussis (Tdap) vaccine  Obesity affecting pregnancy in second trimester - Plan: TSH Rfx on Abnormal to Free T4, Protein / creatinine ratio, urine  Swelling of lower extremity during pregnancy, antepartum - Plan: Ambulatory Referral for DME, Protein / creatinine ratio, urine  Uterine size-date discrepancy in third  trimester  Hemoglobin C trait (HCC)  Supervision of high risk pregnancy due to social problems, antepartum - Plan: Ambulatory referral to Social Work  Positive depression screening - Plan: Ambulatory referral to Behavioral Health  Labs: AB pos RI VI 28 wk labs today; Add TSH  Genetics: CFDNA low risk MSAFP missed Its a girl  Plans breast/bottle Plans Paragard Immunizations: declined COVID vax, plans flu vax; tdap today BMI 44: -ASA 81. plan MFM growth scan at 32 and 36 weeks, missed last one will need to reschedule. Measures S>D today. weekly BPP at 32 weeks or antepartum surveillance as indicated by MFM. anesthesia consult has been scheduled; TWG good at 12 lbs Need compression stockings for swelling. Tried to order as DME. Recheck PC ratio.  Hgb C trait - FOB testing neg last prenancy  Pt with pos depression and anxiety screen today. Pt reports Cindy [name] has not been able to afford the Zoloft rx although it is on $4 formulary. Her partner has recently loss his job and Cindy [name] is not working outside the home. Cindy [name] is supported on [facility] and food stamps. [facility] for mood and also obtain social work consult for assitance with meds.   Return in about 2 weeks (around 04/21/2022) for routine OB.

## 2022-04-08 LAB — 28 WEEK RH+PANEL
Basophils Absolute: 0 10*3/uL (ref 0.0–0.2)
Basos: 1 %
EOS (ABSOLUTE): 0.3 10*3/uL (ref 0.0–0.4)
Eos: 3 %
Gestational Diabetes Screen: 113 mg/dL (ref 70–139)
HIV Screen 4th Generation wRfx: NONREACTIVE
Hematocrit: 32.8 % — ABNORMAL LOW (ref 34.0–46.6)
Hemoglobin: 10.9 g/dL — ABNORMAL LOW (ref 11.1–15.9)
Immature Grans (Abs): 0 10*3/uL (ref 0.0–0.1)
Immature Granulocytes: 0 %
Lymphocytes Absolute: 2.3 10*3/uL (ref 0.7–3.1)
Lymphs: 27 %
MCH: 23.2 pg — ABNORMAL LOW (ref 26.6–33.0)
MCHC: 33.2 g/dL (ref 31.5–35.7)
MCV: 70 fL — ABNORMAL LOW (ref 79–97)
Monocytes Absolute: 0.5 10*3/uL (ref 0.1–0.9)
Monocytes: 6 %
Neutrophils Absolute: 5.4 10*3/uL (ref 1.4–7.0)
Neutrophils: 63 %
Platelets: 278 10*3/uL (ref 150–450)
RBC: 4.69 x10E6/uL (ref 3.77–5.28)
RDW: 15.7 % — ABNORMAL HIGH (ref 11.7–15.4)
RPR Ser Ql: NONREACTIVE
WBC: 8.5 10*3/uL (ref 3.4–10.8)

## 2022-04-08 LAB — TSH RFX ON ABNORMAL TO FREE T4: TSH: 1.16 u[IU]/mL (ref 0.450–4.500)

## 2022-04-09 LAB — PROTEIN / CREATININE RATIO, URINE
Creatinine, Urine: 172 mg/dL
Protein, Ur: 30.8 mg/dL
Protein/Creat Ratio: 179 mg/g creat (ref 0–200)

## 2022-04-10 ENCOUNTER — Observation Stay
Admission: EM | Admit: 2022-04-10 | Discharge: 2022-04-10 | Disposition: A | Discharge status: Home / Self Care | Payer: Medicaid Other | Attending: Certified Nurse Midwife | Admitting: Certified Nurse Midwife

## 2022-04-10 ENCOUNTER — Telehealth: Payer: Self-pay

## 2022-04-10 ENCOUNTER — Encounter: Payer: Self-pay | Admitting: Obstetrics and Gynecology

## 2022-04-10 ENCOUNTER — Telehealth: Payer: Self-pay | Admitting: Licensed Clinical Social Worker

## 2022-04-10 ENCOUNTER — Other Ambulatory Visit: Payer: Self-pay

## 2022-04-10 DIAGNOSIS — O26892 Other specified pregnancy related conditions, second trimester: Secondary | ICD-10-CM

## 2022-04-10 DIAGNOSIS — R519 Headache, unspecified: Secondary | ICD-10-CM

## 2022-04-10 DIAGNOSIS — R87613 High grade squamous intraepithelial lesion on cytologic smear of cervix (HGSIL): Secondary | ICD-10-CM

## 2022-04-10 DIAGNOSIS — Z7982 Long term (current) use of aspirin: Secondary | ICD-10-CM | POA: Diagnosis not present

## 2022-04-10 DIAGNOSIS — Z3A27 27 weeks gestation of pregnancy: Secondary | ICD-10-CM | POA: Insufficient documentation

## 2022-04-10 DIAGNOSIS — Z79899 Other long term (current) drug therapy: Secondary | ICD-10-CM | POA: Insufficient documentation

## 2022-04-10 DIAGNOSIS — O1202 Gestational edema, second trimester: Principal | ICD-10-CM | POA: Insufficient documentation

## 2022-04-10 DIAGNOSIS — O99212 Obesity complicating pregnancy, second trimester: Secondary | ICD-10-CM

## 2022-04-10 DIAGNOSIS — O0990 Supervision of high risk pregnancy, unspecified, unspecified trimester: Secondary | ICD-10-CM

## 2022-04-10 LAB — COMPREHENSIVE METABOLIC PANEL
ALT: 33 U/L (ref 0–44)
AST: 25 U/L (ref 15–41)
Albumin: 2.8 g/dL — ABNORMAL LOW (ref 3.5–5.0)
Alkaline Phosphatase: 85 U/L (ref 38–126)
Anion gap: 5 (ref 5–15)
BUN: 6 mg/dL (ref 6–20)
CO2: 24 mmol/L (ref 22–32)
Calcium: 8.6 mg/dL — ABNORMAL LOW (ref 8.9–10.3)
Chloride: 108 mmol/L (ref 98–111)
Creatinine, Ser: 0.46 mg/dL (ref 0.44–1.00)
GFR, Estimated: >60 mL/min (ref >60)
Glucose, Bld: 82 mg/dL (ref 70–99)
Potassium: 3.3 mmol/L — ABNORMAL LOW (ref 3.5–5.1)
Sodium: 137 mmol/L (ref 135–145)
Total Bilirubin: 0.4 mg/dL (ref 0.3–1.2)
Total Protein: 6.6 g/dL (ref 6.5–8.1)

## 2022-04-10 LAB — CBC
HCT: 31.9 % — ABNORMAL LOW (ref 36.0–46.0)
Hemoglobin: 10.7 g/dL — ABNORMAL LOW (ref 12.0–15.0)
MCH: 22.9 pg — ABNORMAL LOW (ref 26.0–34.0)
MCHC: 33.5 g/dL (ref 30.0–36.0)
MCV: 68.3 fL — ABNORMAL LOW (ref 80.0–100.0)
Platelets: 247 10*3/uL (ref 150–400)
RBC: 4.67 MIL/uL (ref 3.87–5.11)
RDW: 14.9 % (ref 11.5–15.5)
WBC: 10.1 10*3/uL (ref 4.0–10.5)
nRBC: 0 % (ref 0.0–0.2)

## 2022-04-10 LAB — PROTEIN / CREATININE RATIO, URINE
Creatinine, Urine: 113 mg/dL
Protein Creatinine Ratio: 0.07 mg/mg{Cre} (ref 0.00–0.15)
Total Protein, Urine: 8 mg/dL

## 2022-04-10 MED ORDER — BUTALBITAL-APAP-CAFFEINE 50-325-40 MG PO TABS
1.0000 | ORAL_TABLET | Freq: Once | ORAL | Status: AC
  Administered 2022-04-10: 1 via ORAL
  Filled 2022-04-10: qty 1

## 2022-04-10 NOTE — Progress Notes (Signed)
RN at bedside to evaluate headache after medication. Pt was asleep and when RN woke her to ask about headache pt states it is 6/10 pain scale but better than before. CNM notified and orders given for discharge.

## 2022-04-10 NOTE — OB Triage Note (Signed)
    L&D OB Triage Note  SUBJECTIVE Cindy [name] is a 26 y.o. G44P2002 female at [redacted]w[redacted]d, EDD Estimated Date of Delivery: 07/05/22 who presented to triage with complaints of swelling that she had yesterday and headache..She denies contractions , loss of fluid, vaginal bleeding and is feeling fetal movement.    OB History  Gravida Para Term Preterm AB Living  3 2 2  0 0 2  SAB IAB Ectopic Multiple Live Births  0 0 0 0 2    # Outcome Date GA Lbr Len/2nd Weight Sex Delivery Anes PTL Lv  3 Current           2 Term 06/05/19 [redacted]w[redacted]d 403:54 / 03:46 3030 g F Vag-Spont EPI  LIV     Name: Daigler,[name]     Apgar1: 8  Apgar5: 9  1 Term 10/24/16 [redacted]w[redacted]d 07:53 / 00:07 3230 g F Vag-Spont EPI  LIV     Name: Manasco,GIRL Shavone     Apgar1: 8  Apgar5: 9    Medications Prior to Admission  Medication Sig Dispense Refill Last Dose   acetaminophen (TYLENOL) 500 MG tablet Take 500 mg by mouth every 6 (six) hours as needed for headache.   04/09/2022   aspirin EC 81 MG tablet Take 81 mg by mouth daily. Swallow whole.   04/09/2022   Prenatal Vit-Fe Fumarate-FA (PRENATAL MULTIVITAMIN) TABS tablet Take 1 tablet by mouth daily at 12 noon.   04/10/2022   sertraline (ZOLOFT) 25 MG tablet Take 1 tablet (25 mg total) by mouth daily. (Patient not taking: Reported on 03/25/2022) 30 tablet 5      OBJECTIVE  Nursing Evaluation:   BP (!) 92/48   Pulse 74   Temp 98.3 F (36.8 C) (Oral)   Resp 18   Ht 5' (1.524 m)   Wt 111.6 kg   LMP 10/03/2021 (Exact Date)   SpO2 100%   BMI 48.04 kg/m    Findings:   migraine headache that improved with treatment       NST was performed and has been reviewed by me.  NST INTERPRETATION: Category I  Mode: External Baseline Rate (A): 130 bpm Variability: Moderate Accelerations: 15 x 15 Decelerations: None     Contraction Frequency (min): none  ASSESSMENT Impression:  1.  Pregnancy:  [id number] at [redacted]w[redacted]d , EDD Estimated Date of Delivery: 07/05/22 2.  Reassuring fetal and  maternal status 3.  Negative for pre eclampsia, migraine headache that improved with medication.  PLAN 1. Current condition and above findings reviewed.  Reassuring fetal and maternal condition. 2. Discharge home with standard labor precautions given to return to L&D or call the office for problems. 3. Continue routine prenatal care.      Doreene Burke, CNM

## 2022-04-11 NOTE — Telephone Encounter (Signed)
Pt was evaluated at [facility] L&D.

## 2022-04-13 ENCOUNTER — Telehealth: Payer: Self-pay

## 2022-04-13 NOTE — Telephone Encounter (Signed)
Hey Dr. Sharlet Salina a referral was placed for Cindy [name] to receive compression stockings. That would need to be a Rx and we aren't authorized to do that.   However, Crystal checked a website [employer] that has breast pumps, pregnancy support and postpartum support garments. I checked the site for the patient's specific eligigility. Breast pump is covered but support garments are not.   If you want the patient to get some compression stockings can you write a Rx for this patient as we aren't allowed to.

## 2022-04-20 ENCOUNTER — Encounter: Payer: Self-pay | Admitting: Oncology

## 2022-04-20 ENCOUNTER — Other Ambulatory Visit: Payer: Self-pay

## 2022-04-20 ENCOUNTER — Telehealth: Payer: Self-pay

## 2022-04-20 ENCOUNTER — Ambulatory Visit: Payer: Medicaid Other | Attending: Obstetrics

## 2022-04-20 VITALS — BP 135/75 | HR 85 | Temp 98.2°F | Ht 60.0 in | Wt 242.0 lb

## 2022-04-20 DIAGNOSIS — R87613 High grade squamous intraepithelial lesion on cytologic smear of cervix (HGSIL): Secondary | ICD-10-CM

## 2022-04-20 DIAGNOSIS — O99212 Obesity complicating pregnancy, second trimester: Secondary | ICD-10-CM

## 2022-04-20 DIAGNOSIS — Z3A00 Weeks of gestation of pregnancy not specified: Secondary | ICD-10-CM | POA: Diagnosis not present

## 2022-04-20 DIAGNOSIS — D582 Other hemoglobinopathies: Secondary | ICD-10-CM

## 2022-04-20 DIAGNOSIS — O285 Abnormal chromosomal and genetic finding on antenatal screening of mother: Secondary | ICD-10-CM | POA: Diagnosis not present

## 2022-04-20 DIAGNOSIS — Z3A29 29 weeks gestation of pregnancy: Secondary | ICD-10-CM

## 2022-04-20 DIAGNOSIS — E669 Obesity, unspecified: Secondary | ICD-10-CM

## 2022-04-20 DIAGNOSIS — O99012 Anemia complicating pregnancy, second trimester: Secondary | ICD-10-CM | POA: Diagnosis not present

## 2022-04-20 DIAGNOSIS — O0990 Supervision of high risk pregnancy, unspecified, unspecified trimester: Secondary | ICD-10-CM

## 2022-04-20 DIAGNOSIS — D509 Iron deficiency anemia, unspecified: Secondary | ICD-10-CM

## 2022-04-20 NOTE — Telephone Encounter (Signed)
PT called back and confirmed that she had rescheduled appt. I confirmed on Epic that she had appt on 04-20-2022.

## 2022-04-20 NOTE — Telephone Encounter (Signed)
Patient transferred by front desk. She reports she missed her MFM ultraound on 03/16/22. Gave patient [phone] to contact [facility] to reschedule. Advised they are only open on Tuesday and Thursday.

## 2022-04-24 ENCOUNTER — Encounter: Payer: Medicaid Other | Admitting: Obstetrics & Gynecology

## 2022-04-25 ENCOUNTER — Ambulatory Visit (INDEPENDENT_AMBULATORY_CARE_PROVIDER_SITE_OTHER): Payer: Medicaid Other | Admitting: Obstetrics & Gynecology

## 2022-04-25 VITALS — BP 136/78 | Wt 243.0 lb

## 2022-04-25 DIAGNOSIS — O99213 Obesity complicating pregnancy, third trimester: Secondary | ICD-10-CM

## 2022-04-25 DIAGNOSIS — R87613 High grade squamous intraepithelial lesion on cytologic smear of cervix (HGSIL): Secondary | ICD-10-CM

## 2022-04-25 DIAGNOSIS — Z3A29 29 weeks gestation of pregnancy: Secondary | ICD-10-CM

## 2022-04-25 DIAGNOSIS — O0990 Supervision of high risk pregnancy, unspecified, unspecified trimester: Secondary | ICD-10-CM

## 2022-04-25 LAB — POCT URINALYSIS DIPSTICK OB
Glucose, UA: NEGATIVE
POC,PROTEIN,UA: NEGATIVE

## 2022-04-25 NOTE — Progress Notes (Signed)
Subjective:    Cindy [name] is a 26 y.o.P2 (both girls) being seen today for her obstetrical visit. She is at 49w6dgestation. Patient reports no complaints. Fetal movement: normal.  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  Pregnancy:  [id number]   Plan:    Patient Active Problem List   Diagnosis Date Noted   Swelling of lower extremity during pregnancy, antepartum 04/07/2022   Uterine size-date discrepancy in third trimester 04/07/2022   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   Labor and delivery, indication for care 03/25/2022   Obesity affecting pregnancy 03/10/2022   Abdominal pain 02/18/2022   Supervision of high risk pregnancy, antepartum 12/06/2021   Positive pregnancy test 11/01/2021   [facility] disorder ([facility] 04/26/2019   Morbid obesity with BMI of 40.0-44.9, adult ([place] 04/08/2019   Hemoglobin C trait ([facility] 04/08/2019   Iron deficiency anemia 03/14/2019

## 2022-04-25 NOTE — Addendum Note (Signed)
Addended by: Drenda Freeze on: 04/25/2022 10:31 AM   Modules accepted: Orders

## 2022-04-25 NOTE — Progress Notes (Signed)
No vb. No lof. Pt states she has not been feeling well the past few days, little energy

## 2022-04-27 ENCOUNTER — Ambulatory Visit: Payer: Medicaid Other | Admitting: Licensed Clinical Social Worker

## 2022-05-02 NOTE — Telephone Encounter (Signed)
Is this still needed

## 2022-05-04 ENCOUNTER — Telehealth: Payer: Self-pay

## 2022-05-04 NOTE — Telephone Encounter (Signed)
Pt calling; has apt on the 26th; 31wks; has questions and concerns - baby moves a LOT causing pelvic pressure which makes her nauseas.  [phone] Left detailed msg to schedule appt; it may be we can see her sooner than the 26th.

## 2022-05-04 NOTE — Telephone Encounter (Signed)
Called and left voicemail for patient to call back to be scheduled. 

## 2022-05-04 NOTE — Telephone Encounter (Signed)
Pt does not need them, states when she walks she has the swelling after elevating feet swelling is fine.

## 2022-05-10 ENCOUNTER — Ambulatory Visit (INDEPENDENT_AMBULATORY_CARE_PROVIDER_SITE_OTHER): Payer: Medicaid Other | Admitting: Advanced Practice Midwife

## 2022-05-10 ENCOUNTER — Encounter: Payer: Self-pay | Admitting: Advanced Practice Midwife

## 2022-05-10 VITALS — BP 132/74 | Wt 239.0 lb

## 2022-05-10 DIAGNOSIS — O0993 Supervision of high risk pregnancy, unspecified, third trimester: Secondary | ICD-10-CM

## 2022-05-10 DIAGNOSIS — Z3A32 32 weeks gestation of pregnancy: Secondary | ICD-10-CM

## 2022-05-10 DIAGNOSIS — S334XXA Traumatic rupture of symphysis pubis, initial encounter: Secondary | ICD-10-CM | POA: Insufficient documentation

## 2022-05-10 DIAGNOSIS — D509 Iron deficiency anemia, unspecified: Secondary | ICD-10-CM

## 2022-05-10 DIAGNOSIS — O99213 Obesity complicating pregnancy, third trimester: Secondary | ICD-10-CM

## 2022-05-10 DIAGNOSIS — O26893 Other specified pregnancy related conditions, third trimester: Secondary | ICD-10-CM

## 2022-05-10 DIAGNOSIS — O26713 Subluxation of symphysis (pubis) in pregnancy, third trimester: Secondary | ICD-10-CM

## 2022-05-10 DIAGNOSIS — O99013 Anemia complicating pregnancy, third trimester: Secondary | ICD-10-CM

## 2022-05-10 NOTE — Progress Notes (Signed)
No vb. No lof. Pt states shes having trouble sleeping.

## 2022-05-10 NOTE — Progress Notes (Signed)
Routine Prenatal Care Visit  Subjective  Cindy [name] is a 26 y.o. G3P2002 at 1w0dbeing seen today for ongoing prenatal care.  She is currently monitored for the following issues for this high-risk pregnancy and has Iron deficiency anemia; Morbid obesity with BMI of 40.0-44.9, adult ([place]; Hemoglobin C trait ([facility]; Mood disorder ([facility]; Positive pregnancy test; Supervision of high risk pregnancy, antepartum; Abdominal pain; Obesity affecting pregnancy; Labor and delivery, indication for care; HGSIL on Pap smear of cervix; Swelling of lower extremity during pregnancy, antepartum; Uterine size-date discrepancy in third trimester; and Symphysis pubis disruption on their problem list.  ----------------------------------------------------------------------------------- Patient reports low pelvic pain that started this morning when she was getting out of bed. The pain is constant and worse with movement.  Reviewed comfort measures for likely pubic symphysis dysfunction. She reports her mood is stable at this time. She has an appointment for anesthesia consult next week. Contractions: Not present. Vag. Bleeding: None.  Movement: Present. Leaking Fluid denies.  ----------------------------------------------------------------------------------- The following portions of the patient's history were reviewed and updated as appropriate: allergies, current medications, past family history, past medical history, past social history, past surgical history and problem list. Problem list updated.  Objective  Blood pressure 132/74, weight 239 lb (108.4 kg), last menstrual period 10/03/2021 Pregravid weight 230 lb (104.3 kg) Total Weight Gain 9 lb (4.082 kg) Urinalysis: Urine Protein    Urine Glucose    Fetal Status: Fetal Heart Rate (bpm): 154   Movement: Present     General:  Alert, oriented and cooperative. Patient is in no acute distress.  Skin: Skin is warm and dry. No rash noted.   Cardiovascular: Normal  heart rate noted  Respiratory: Normal respiratory effort, no problems with respiration noted  Abdomen: Soft, gravid, appropriate for gestational age. Pain/Pressure: Present     Pelvic:  Cervical exam deferred        Extremities: Normal range of motion.  Edema: None  Mental Status: Normal mood and affect. Normal behavior. Normal judgment and thought content.   Assessment   26y.o. G3P2002 at 332w0dy  07/05/2022, by Ultrasound presenting for routine prenatal visit  Plan   pregnancy Problems (from 12/06/21 to present)     Problem Noted Resolved   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 by FrImagene RichesCNM No   Overview Signed 03/28/2022 12:52 PM by FrImagene RichesCNM    HGSIL pap during pregnancy- needs colpo per EvAmalia Haileynd ChWoolseyntpartally      Obesity affecting pregnancy 03/10/2022 by GlRod CanCNM No   Supervision of high risk pregnancy, antepartum 12/06/2021 by FrImagene RichesCNM No   Overview Addendum 02/17/2022 12:27 AM by FrImagene RichesCNM     Nursing Staff Provider  Office Location  [facility] Dating  5w6w6d24/23  Language  English Anatomy [place] female  Flu Vaccine   Genetic Screen  NIPS: female  TDaP vaccine    Hgb A1C or  GTT Early :5.2 Third trimester :   Covid    LAB RESULTS   Rhogam  NA Blood Type   AB+  Feeding Plan Bottle Antibody  negative  Contraception  Rubella  immune  Circumcision NA RPR   NR  Pediatrician   HBsAg   negative  Support Person  HIV  negative  Prenatal Classes  Varicella immune    GBS  (For PCN allergy, check sensitivities)   BTL Consent   Has Hemoglobin C trait.  VBAC Consent  Pap 12/06/21 neg, hpv neg     Hgb Electro      CF      SMA                    Preterm labor symptoms and general obstetric precautions including but not limited to vaginal bleeding, contractions, leaking of fluid and fetal movement were reviewed in detail with the patient. Please refer to After Visit Summary for other counseling  recommendations.   Return in about 2 weeks (around 05/24/2022) for rob with NST.  Rod Can, CNM 05/10/2022 3:16 PM

## 2022-05-11 ENCOUNTER — Observation Stay
Admission: EM | Admit: 2022-05-11 | Discharge: 2022-05-11 | Disposition: A | Discharge status: Home / Self Care | Payer: Medicaid Other | Attending: Advanced Practice Midwife | Admitting: Advanced Practice Midwife

## 2022-05-11 ENCOUNTER — Other Ambulatory Visit: Payer: Self-pay

## 2022-05-11 ENCOUNTER — Encounter: Payer: Self-pay | Admitting: Obstetrics and Gynecology

## 2022-05-11 DIAGNOSIS — Z7982 Long term (current) use of aspirin: Secondary | ICD-10-CM | POA: Diagnosis not present

## 2022-05-11 DIAGNOSIS — Z3A32 32 weeks gestation of pregnancy: Secondary | ICD-10-CM

## 2022-05-11 DIAGNOSIS — S334XXA Traumatic rupture of symphysis pubis, initial encounter: Secondary | ICD-10-CM

## 2022-05-11 DIAGNOSIS — R87613 High grade squamous intraepithelial lesion on cytologic smear of cervix (HGSIL): Principal | ICD-10-CM

## 2022-05-11 DIAGNOSIS — O99213 Obesity complicating pregnancy, third trimester: Secondary | ICD-10-CM

## 2022-05-11 DIAGNOSIS — O26713 Subluxation of symphysis (pubis) in pregnancy, third trimester: Secondary | ICD-10-CM | POA: Diagnosis not present

## 2022-05-11 DIAGNOSIS — O26719 Subluxation of symphysis (pubis) in pregnancy, unspecified trimester: Principal | ICD-10-CM | POA: Insufficient documentation

## 2022-05-11 DIAGNOSIS — O26893 Other specified pregnancy related conditions, third trimester: Secondary | ICD-10-CM | POA: Diagnosis present

## 2022-05-11 DIAGNOSIS — O0990 Supervision of high risk pregnancy, unspecified, unspecified trimester: Secondary | ICD-10-CM

## 2022-05-11 LAB — URINALYSIS, COMPLETE (UACMP) WITH MICROSCOPIC
Bacteria, UA: NONE SEEN
Bilirubin Urine: NEGATIVE
Glucose, UA: NEGATIVE mg/dL
Hgb urine dipstick: NEGATIVE
Ketones, ur: 5 mg/dL — AB
Leukocytes,Ua: NEGATIVE
Nitrite: NEGATIVE
Protein, ur: 30 mg/dL — AB
Specific Gravity, Urine: 1.03 (ref 1.005–1.030)
pH: 6 (ref 5.0–8.0)

## 2022-05-11 LAB — CHLAMYDIA/NGC RT PCR (ARMC ONLY)
Chlamydia Tr: NOT DETECTED
N gonorrhoeae: NOT DETECTED

## 2022-05-11 LAB — WET PREP, GENITAL
Clue Cells Wet Prep HPF POC: NONE SEEN
Sperm: NONE SEEN
Trich, Wet Prep: NONE SEEN
WBC, Wet Prep HPF POC: <10 (ref <10)
Yeast Wet Prep HPF POC: NONE SEEN

## 2022-05-11 NOTE — Discharge Instructions (Signed)
Please return to hospital for any of the following symptoms:  -less than 10 fetal movements in a 2-hour period -contractions every 5 minutes for a 2-hour period  -bright, red vaginal bleeding -leakage of fluid  -"rock hard" abdomen that is not relaxing

## 2022-05-11 NOTE — Discharge Summary (Signed)
Physician Final Progress Note  Patient ID: Cindy [name] MRN: [id number] DOB/AGE: [birth year] 24 y.o.  Admit date: 05/11/2022 Admitting provider: Rod Can, CNM Discharge date: 05/11/2022   Admission Diagnoses:  1) intrauterine pregnancy at [redacted]w[redacted]d 2) pelvic pain  Discharge Diagnoses:  Active Problems:   Labor and delivery, indication for care   Symphysis pubis disruption   [redacted] weeks gestation of pregnancy    History of Present Illness: The patient is a 26y.o. female G3P2002 at 326w1dho presents for constant pelvic pressure that she has felt for the past 2 days. She was seen in the office yesterday for routine prenatal visit with the same complaint. We discussed possible pubic symphysis dysfunction and comfort measures. Today she states she has tried the comfort measures with no relief. She is admitted for observation, placed on monitors, labs collected. Monitoring is reassuring. Labs are pending and patient requests discharge due to child care issues. I will follow up with her after the labs result. She is discharged to home with instructions and precautions.    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    Past Surgical History:  Procedure Laterality Date   COLONOSCOPY WITH PROPOFOL N/A 06/15/2017   Procedure: COLONOSCOPY WITH PROPOFOL;  Surgeon: AnJonathon BellowsMD;  [facility];  Service: Gastroenterology;  Laterality: N/A;   ESOPHAGOGASTRODUODENOSCOPY (EGD) WITH PROPOFOL N/A 06/15/2017   Procedure: ESOPHAGOGASTRODUODENOSCOPY (EGD) WITH PROPOFOL;  Surgeon: AnJonathon BellowsMD;  [facility];  Service: Gastroenterology;  Laterality: N/A;   TONSILLECTOMY      No current facility-administered  medications on file prior to encounter.   Current Outpatient Medications on File Prior to Encounter  Medication Sig Dispense Refill   acetaminophen (TYLENOL) 500 MG tablet Take 500 mg by mouth every 6 (six) hours as needed for headache.     aspirin EC 81 MG tablet Take 81 mg by mouth daily. Swallow whole.     Prenatal Vit-Fe Fumarate-FA (PRENATAL MULTIVITAMIN) TABS tablet Take 1 tablet by mouth daily at 12 noon.     sertraline (ZOLOFT) 25 MG tablet Take 1 tablet (25 mg total) by mouth daily. (Patient not taking: Reported on 03/25/2022) 30 tablet 5    No Known Allergies  Social History   Socioeconomic History   Marital status: Significant Other    Spouse name: JaBuilding surveyor Number of children: 1   Years of education: Not on file   Highest education level: 9th grade  Occupational History   Not on file  Tobacco Use   Smoking status: Never   Smokeless tobacco: Never  Vaping Use   Vaping Use: Never used  Substance and Sexual Activity   Alcohol use: No    Comment: former   Drug use: Not Currently    Types: Marijuana    Comment: last used in sept   Sexual activity: Not Currently    Partners: Male    Birth control/protection: Injection    Comment: depo  Other Topics Concern   Not on file  Social History Narrative   Not on file   Social Determinants of Health   Financial Resource Strain: Not on file  Food Insecurity: Food Insecurity Present (08/27/2020)   Hunger Vital Sign    Worried About Running Out of Food in the Last Year: Sometimes true    Ran Out of Food in the Last Year: Often true  Transportation Needs: No Transportation Needs (08/27/2020)   PRAPARE - Hydrologist (Medical): No    Lack of Transportation (Non-Medical): No  Physical Activity: Not on file  Stress: Not on file  Social Connections: Not on file  Intimate Partner Violence: Not on file    Family History  Problem Relation Age of Onset   Thyroid disease Mother     Hypertension Mother    Sickle cell trait Mother    Sickle cell anemia Sister    Hypertension Maternal Grandmother      Review of Systems  Constitutional:  Negative for chills and fever.  HENT:  Negative for congestion, ear discharge, ear pain, hearing loss, sinus pain and sore throat.   Eyes:  Negative for blurred vision and double vision.  Respiratory:  Negative for cough, shortness of breath and wheezing.   Cardiovascular:  Negative for chest pain, palpitations and leg swelling.  Gastrointestinal:  Positive for abdominal pain. Negative for blood in stool, constipation, diarrhea, heartburn, melena, nausea and vomiting.  Genitourinary:  Negative for dysuria, flank pain, frequency, hematuria and urgency.       Positive for pelvic pain/pressure, vaginal discharge  Musculoskeletal:  Negative for back pain, joint pain and myalgias.  Skin:  Negative for itching and rash.  Neurological:  Negative for dizziness, tingling, tremors, sensory change, speech change, focal weakness, seizures, loss of consciousness, weakness and headaches.  Endo/Heme/Allergies:  Negative for environmental allergies. Does not bruise/bleed easily.  Psychiatric/Behavioral:  Negative for depression, hallucinations, memory loss, substance abuse and suicidal ideas. The patient is not nervous/anxious and does not have insomnia.      Physical Exam: BP 118/61 (BP Location: Right Arm)   Pulse 84   Temp 98.2 F (36.8 C) (Oral)   Resp 15   Ht 5' (1.524 m)   Wt 108.4 kg   LMP 10/03/2021 (Exact Date)   BMI 46.68 kg/m   Constitutional: Well nourished, well developed female in no acute distress.  HEENT: normal Skin: Warm and dry.  Cardiovascular: Regular rate and rhythm.   Respiratory: Clear to auscultation bilateral. Normal respiratory effort Abdomen: FHT present Back: no CVAT Neuro: DTRs 2+, Cranial nerves grossly intact Psych: Alert and Oriented x3. No memory deficits. Normal mood and affect.   Toco: rare  contraction Fetal well being: 135 bpm, moderate variability, +accelerations, -decelerations  Consults: None  Significant Findings/ Diagnostic Studies: labs:   Latest Reference Range & Units 05/11/22 16:43 05/11/22 17:15  Yeast Wet Prep HPF POC NONE SEEN   NONE SEEN  Trich, Wet Prep NONE SEEN   NONE SEEN  Clue Cells Wet Prep HPF POC NONE SEEN   NONE SEEN  WBC, Wet Prep HPF POC <10   <10  Appearance CLEAR  CLEAR !   Bilirubin Urine NEGATIVE  NEGATIVE   Color, Urine YELLOW  YELLOW !   Glucose, UA NEGATIVE mg/dL NEGATIVE   Hgb urine dipstick NEGATIVE  NEGATIVE   Ketones, ur NEGATIVE mg/dL 5 !   Leukocytes,Ua NEGATIVE  NEGATIVE   Nitrite NEGATIVE  NEGATIVE   pH 5.0 - 8.0  6.0   Protein NEGATIVE mg/dL 30 !   Specific Gravity, Urine 1.005 - 1.030  1.030   Bacteria, UA NONE SEEN  NONE SEEN  Mucus  PRESENT   RBC / HPF 0 - 5 RBC/hpf 0-5   Squamous Epithelial / LPF 0 - 5  0-5   WBC, UA 0 - 5 WBC/hpf 0-5   WET PREP, GENITAL   Rpt  !: Data is abnormal Rpt: View report in Results Review for more information   Procedures: NST  Hospital Course: The patient was admitted to Labor and Delivery Triage for observation.   Discharge Condition: good  Disposition: Discharge disposition: 01-Home or Self Care  Diet: Regular diet  Discharge Activity: Activity as tolerated  Discharge Instructions     Discharge activity:  No Restrictions   Complete by: As directed    Discharge diet:  No restrictions   Complete by: As directed       Allergies as of 05/11/2022   No Known Allergies      Medication List     STOP taking these medications    sertraline 25 MG tablet Commonly known as: Zoloft       TAKE these medications    acetaminophen 500 MG tablet Commonly known as: TYLENOL Take 500 mg by mouth every 6 (six) hours as needed for headache.   aspirin EC 81 MG tablet Take 81 mg by mouth daily. Swallow whole.   prenatal multivitamin Tabs tablet Take 1 tablet by mouth daily at  12 noon.         Total time spent taking care of this patient: 20 minutes  Signed: Rod Can, CNM  05/11/2022, 5:44 PM

## 2022-05-11 NOTE — OB Triage Note (Signed)
Patient is a G3P2 at 24w1dwho presents with c/o constant, lower pelvic pressure since last night and presumes she lost her mucous plug around noon today. Reports +FM, denies vaginal bleeding and sexual intercourse in the last 24 hours. External monitors applied and assessing. Initial FHT 135.

## 2022-05-16 ENCOUNTER — Other Ambulatory Visit: Payer: Self-pay

## 2022-05-16 DIAGNOSIS — O99013 Anemia complicating pregnancy, third trimester: Secondary | ICD-10-CM

## 2022-05-16 DIAGNOSIS — O99213 Obesity complicating pregnancy, third trimester: Secondary | ICD-10-CM

## 2022-05-16 DIAGNOSIS — D582 Other hemoglobinopathies: Secondary | ICD-10-CM

## 2022-05-18 ENCOUNTER — Other Ambulatory Visit
Admission: RE | Admit: 2022-05-18 | Discharge: 2022-05-18 | Disposition: A | Discharge status: Home / Self Care | Payer: Medicaid Other | Source: Ambulatory Visit | Attending: Urgent Care | Admitting: Urgent Care

## 2022-05-18 ENCOUNTER — Other Ambulatory Visit: Payer: Self-pay

## 2022-05-18 ENCOUNTER — Ambulatory Visit: Payer: Medicaid Other | Attending: Obstetrics

## 2022-05-18 VITALS — BP 120/66 | HR 73 | Temp 97.5°F | Ht 60.0 in | Wt 244.0 lb

## 2022-05-18 DIAGNOSIS — Z3A33 33 weeks gestation of pregnancy: Secondary | ICD-10-CM | POA: Insufficient documentation

## 2022-05-18 DIAGNOSIS — O99013 Anemia complicating pregnancy, third trimester: Secondary | ICD-10-CM | POA: Diagnosis not present

## 2022-05-18 DIAGNOSIS — D649 Anemia, unspecified: Secondary | ICD-10-CM | POA: Diagnosis not present

## 2022-05-18 DIAGNOSIS — O99213 Obesity complicating pregnancy, third trimester: Secondary | ICD-10-CM | POA: Diagnosis present

## 2022-05-18 DIAGNOSIS — E669 Obesity, unspecified: Secondary | ICD-10-CM

## 2022-05-18 DIAGNOSIS — Z6841 Body Mass Index (BMI) 40.0 and over, adult: Secondary | ICD-10-CM | POA: Insufficient documentation

## 2022-05-18 DIAGNOSIS — Z148 Genetic carrier of other disease: Secondary | ICD-10-CM | POA: Diagnosis not present

## 2022-05-18 DIAGNOSIS — D582 Other hemoglobinopathies: Secondary | ICD-10-CM

## 2022-05-18 DIAGNOSIS — O285 Abnormal chromosomal and genetic finding on antenatal screening of mother: Secondary | ICD-10-CM

## 2022-05-18 DIAGNOSIS — R87613 High grade squamous intraepithelial lesion on cytologic smear of cervix (HGSIL): Secondary | ICD-10-CM

## 2022-05-18 DIAGNOSIS — O0990 Supervision of high risk pregnancy, unspecified, unspecified trimester: Secondary | ICD-10-CM

## 2022-05-18 NOTE — Progress Notes (Signed)
Perioperative Services  OB/GYN PRE-ANESTHESIA CONSULTATION  Date: 05/18/22  Patient Demographics:  Name: Cindy [name] DOB:   11-29-1995 MRN:   [id number]  NOTE: Available PAT nursing documentation and vital signs have been reviewed. Available clinical information reviewed in efforts to assist in medical decision making as it pertains to the aforementioned procedure and anticipated anesthetic course.   Pertinent OB/GYN Information:  Birth plan as of 05/18/22: patient plans to deliver via SVD with epidural placement for intrapartum analgesia  OB History     Gravida  3   Para  2   Term  2   Preterm      AB      Living  2      SAB      IAB      Ectopic      Multiple  0   Live Births  2      Clinical Discussion:  Cindy [name] is a 26 y.o. female presenting for pre-anesthesia evaluation prior to planned SVD with [facility] providers. Patient is a 72w1dG3P2A0 with an [place] 07/05/2022. Due to a documented history of perinatal maternal obesity, she has been submitted for pre-delivery consultation with anesthesia team.  Patient has never been a smoker. She denies the use/abuse of any type of substances, including ETOH during this pregnancy. Pertinent PMH includes: anemia, HGSIL on PAP smear, depression/post-partum depression, hemoglobin C trait (heterozygous).   Patient was last seen in the OB/GYN clinic on 05/10/2022; notes reviewed. Intrauterine pregnancy (IUP) has been confirmed by her OB/GYN team via appropriate ultrasound imaging. She has given birth in the past vaginally with neuraxial anesthesia. Patient has been induced in the past and reports that they are looking to potentially induce her with this pregnancy as well.  She denies any known previous complications associated with her pregnancies and/or labor in the past. She reports that she has been deemed high risk with her current pregnancy due to "a hemorrhage" that occurred at approximately the 14 week  mark. Patient notes that this spontaneously resolved and she has had no further issues, but was told that she would "remain high risk for the rest of her pregnancy". Patient also found to have recent HGSIL detected on PAP smear and advises that she "might have to have surgery" after the baby is born to further evaluate these findings. Patient advising that she has received appropriate prenatal care throughout her current pregnancy and verbalizes that everything is on track for her to deliver via her original birthing plan. Per patient report, she has essentially remained in the 240 lb range throughout her pregnancy. She states, "my weight has been up and down. I have not gained a lot. I am about the same weight as I was before I got pregnant".   Patient advises that she does not have any type of known issues with her cervical or lumbar spine.  She has not had any type of surgical procedures, whether with or without hardware placement, on her neck or lower back.   Patient denies previous perioperative complications with anesthesia in the past.   She has never been intubated per her report. She advises that she has never been advised by a medical provider that she had a challenging airway due to her anatomy.   Patient reports that she has undergone previous uncomplicated epidurals and/or neuraxial anesthesia in the past. With her last pregnancy, she reports that she feels like the "medicine wore off too quickly". She reports that it  only lasted for about 2 hours. This sounds as if patient had spinal anesthesia placed, rather than a true epidural, which would account for the abbreviated anesthetic course.  Patient has never been diagnosed with OSAH syndrome requiring the use of prescribed nocturnal PAP therapy.   In review the patient's available EMR, it is noted that she underwent a neuraxial anesthetic course here at [facility] in 05/2019 with no documented  complications.   Providers/Specialists:   NOTE: Primary physician provider listed below. Patient may have been seen by APP or partner within same practice.   PROVIDER ROLE / SPECIALTY LAST OV  [facility] (Surgeon) 05/10/2022  Department, [facility] Primary Care Provider ???   Allergies:  Patient has no known allergies.  Current Home Medications:    acetaminophen (TYLENOL) 500 MG tablet   aspirin EC 81 MG tablet   Prenatal Vit-Fe Fumarate-FA (PRENATAL MULTIVITAMIN) TABS tablet   No current facility-administered medications for this encounter.    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   Past Surgical History:  Procedure Laterality Date   COLONOSCOPY WITH PROPOFOL N/A 06/15/2017   Procedure: COLONOSCOPY WITH PROPOFOL;  Surgeon: [name], MD;  Location: [facility];  Service: Gastroenterology;  Laterality: N/A;   ESOPHAGOGASTRODUODENOSCOPY (EGD) WITH PROPOFOL N/A 06/15/2017   Procedure: ESOPHAGOGASTRODUODENOSCOPY (EGD) WITH PROPOFOL;  Surgeon: [name], MD;  Location[facility];  Service: Gastroenterology;  Laterality: N/A;   TONSILLECTOMY     Family History  Problem Relation Age of Onset   Thyroid disease Mother    Hypertension Mother    Sickle cell trait Mother    Sickle cell anemia Sister    Hypertension Maternal Grandmother    Social History   Tobacco Use   Smoking status: Never   Smokeless tobacco: Never  Vaping Use   Vaping Use: Never used  Substance Use Topics   Alcohol use: No    Comment: former   Drug use: Not Currently    Types: Marijuana    Comment: last used in sept    Review of Systems:  Constitutional: Negative.   HENT:  Negative.    Eyes: Negative.   Respiratory:  Negative for cough, shortness of breath and wheezing.   Cardiovascular:  Negative for chest pain, palpitations and leg swelling.  Gastrointestinal: Negative.  Negative for abdominal pain, heartburn, nausea and vomiting.  Musculoskeletal:  Negative for back pain, falls and neck pain.  Skin:  Negative for rash.  Neurological:  Negative for dizziness and headaches.  Endo/Heme/Allergies: Negative.   Psychiatric/Behavioral:  Negative for depression, substance abuse and suicidal ideas. The patient is not nervous/anxious.  Physical Examination:  Vital signs that were obtained during visit have been reviewed as follows:  Estimated body mass index is 46.68 kg/m as calculated from the following:   Height as of 05/11/22: 5' (1.524 m).   Weight as of 05/11/22: 108.4 kg.  Constitutional:      Appearance: Normal appearance.  HENT:     Head: Normocephalic and atraumatic.     Mouth/Throat:     Mouth: Mucous membranes are moist.  Pharynx: Oropharynx is clear.     Comments: Mallampati: Class 3: Can visualize soft palate.           Thyromental distance: > 3 FB/cm  Neck:     Comments: Supple. FROM without stiffness or pain. Able to touch chin to chest. Cardiovascular:     Rate and Rhythm: Normal rate and regular rhythm.     Pulses: Normal pulses.     Heart sounds: Normal heart sounds.  Pulmonary:     Effort: Pulmonary effort is normal.     Breath sounds: Normal breath sounds.  Abdominal:     Comments: (+) gravid abdomen without complaints of pain. (+) fetal movement; level of activity at baseline (not decreased).  Genitourinary:    Comments: Exam deferred; denies vaginal pain/discharge/bleeding.  Musculoskeletal:        General: No swelling. Normal range of motion.     Cervical back: Neck supple. No rigidity or tenderness.  Skin:    General: Skin is warm and dry.     Findings: No rash.  Neurological:     General: No focal deficit present.      Mental Status: She is alert and oriented to person, place, and time. Mental status is at baseline.     Gait: Gait normal.  Psychiatric:        Mood and Affect: Mood normal.        Behavior: Behavior normal.        Thought Content: Thought content normal.        Judgment: Judgment normal.   Pertinent Clinical Results:   Lab Results  Component Value Date   WBC 10.1 04/10/2022   HGB 10.7 (L) 04/10/2022   HCT 31.9 (L) 04/10/2022   MCV 68.3 (L) 04/10/2022   PLT 247 04/10/2022   Lab Results  Component Value Date   NA 137 04/10/2022   K 3.3 (L) 04/10/2022   CO2 24 04/10/2022   GLUCOSE 82 04/10/2022   BUN 6 04/10/2022   CREATININE 0.46 04/10/2022   CALCIUM 8.6 (L) 04/10/2022   EGFR 126 01/06/2022   GFRNONAA >60 04/10/2022    ECG: Date: 01/30/2022 Time ECG obtained: 0049 PM Rate: 85 bpm Rhythm:  Sinus rhythm Axis (leads I and aVF): Normal Intervals: PR 156 ms. QRS 87 ms. QTc 411 ms. ST segment and T wave changes: No evidence of acute ST segment elevation or depression. Borderline inferior Q waves and diffuse borderline T wave abnormalities.  Comparison: Similar to previous tracing obtained on 06/13/2017.   IMAGING / PROCEDURES: [place] MFM FETAL BPP WO NON STRESS performed on 05/18/2022 Num Of Fetuses: 1 Fetal Heart Rate(bpm):125 Cardiac Activity: Observed Presentation: Cephalic Placenta: Anterior P. Cord Insertion: Previously Visualized Amniotic fluid AFI FV: within normal limits AFI Sum(cm): 8.32      %Tile: 5        Largest Pocket(cm): 3.62 RUQ(cm): 0        RLQ(cm): 2.54        LUQ(cm): 2.16        LLQ(cm): 3.62  Impression and Plan:  Cindy [name] has been referred for pre-anesthesia review and clearance due to perinatal obesity. She was seen today at 63w1dgestation with a body mass index of 47.57 kg/m. Available labs, pertinent testing, and imaging results were personally reviewed by me at the time of consult. Past medical/surgical history reviewed and  discussed with patient. If noted to be applicable to patient, previous anesthetic courses discussed to determine past history of complications associated with  her receiving anesthesia.   Analgesic options during labor were reviewed with patient. Typically epidural anesthesia is utilized, however this is based on patient preference and ability of anesthesia to successfully place an epidural catheter. Discussed epidural anesthesia and the potential challenges that are associated with this type of anesthesia. Placement is more difficult in patients who have a higher BMI, and even in placement is successful, there is increased risk of catheter migration from the epidural space. Additionally, discussed both neuraxial and general anesthetic courses should Cesarean delivery become necessary due to decompensation of maternal and/or fetal condition, or should this delivery option be a part of this patient's original birth plan. Given patient's BMI, she is at increased risk of intubation difficulties during her pregnancy. Reassurance provided regarding the skill set of our anesthesia team. Presented advanced airway technology options (i.e. video laryngoscopy, fiberoptic bronchoscope, etc.) that we have at our disposal that can help mitigate potential complications associated with securing a definitive airway. Discussed that epidural anesthesia is generally safe and that post procedural back pain is often related to posture and weight changes. Reviewed risks associated with epidural/neuraxial anesthesia including spinal/epidural hematoma, infection, nerve damage, PDPH, hypotension, generalized pruritis, and nausea. Allowed time for questions, which were fielded to patient's satisfaction. She verbalized understanding of what was reviewed today.   Following meeting with patient today, she does not have any significant findings that would preclude her from delivering here at [facility]. Note,  her body mass index of 47.57 kg/m. does present challenges as discussed above. However, given her Mallampati score of 3, this patient is deemed to be safe to deliver here at our facility. Based on clinical review and physical exam performed today (05/18/22), the patient does not need to be seen back by anesthesia team prior to delivery. With that being said, patient will meet with anesthesia team (MD and/or CRNA) on this day of her procedure for further evaluation/assessment prior to her delivery.   The anesthesia team is appreciative of her OB/GYN team for involving our service early in this patient's care. Early consultation allows for pre-delivery planning and risk stratification. This ultimately promotes and ensures that our mutual patient will receive the safest and most effective care possible. So again, we appreciate the consult. Please reach out with any questions or concerns that arise during Cindy [name]'s perinatal course. We look forward to working with everyone through this patient's delivery.   Honor Loh, MSN, APRN, FNP-C, CEN [facility]  Peri-operative Services Nurse Practitioner Phone: [phone] Fax: [phone] 05/18/22 4:30 PM  NOTE: This note has been prepared using Dragon dictation software. Despite my best ability to proofread, there is always the potential that unintentional transcriptional errors may still occur from this process.

## 2022-05-23 ENCOUNTER — Other Ambulatory Visit: Payer: Self-pay

## 2022-05-23 DIAGNOSIS — F39 Unspecified mood [affective] disorder: Secondary | ICD-10-CM

## 2022-05-23 DIAGNOSIS — O99013 Anemia complicating pregnancy, third trimester: Secondary | ICD-10-CM

## 2022-05-23 DIAGNOSIS — D582 Other hemoglobinopathies: Secondary | ICD-10-CM

## 2022-05-23 DIAGNOSIS — O99213 Obesity complicating pregnancy, third trimester: Secondary | ICD-10-CM

## 2022-05-24 ENCOUNTER — Ambulatory Visit (INDEPENDENT_AMBULATORY_CARE_PROVIDER_SITE_OTHER): Payer: Medicaid Other | Admitting: Obstetrics & Gynecology

## 2022-05-24 ENCOUNTER — Other Ambulatory Visit: Payer: Medicaid Other

## 2022-05-24 VITALS — BP 110/68 | Wt 243.0 lb

## 2022-05-24 DIAGNOSIS — O0990 Supervision of high risk pregnancy, unspecified, unspecified trimester: Secondary | ICD-10-CM | POA: Diagnosis not present

## 2022-05-24 DIAGNOSIS — O99213 Obesity complicating pregnancy, third trimester: Secondary | ICD-10-CM

## 2022-05-24 DIAGNOSIS — Z3A34 34 weeks gestation of pregnancy: Secondary | ICD-10-CM

## 2022-05-24 DIAGNOSIS — O99212 Obesity complicating pregnancy, second trimester: Secondary | ICD-10-CM

## 2022-05-24 LAB — POCT URINALYSIS DIPSTICK OB: Glucose, UA: NEGATIVE

## 2022-05-25 ENCOUNTER — Other Ambulatory Visit: Payer: Medicaid Other

## 2022-05-31 ENCOUNTER — Encounter: Payer: Self-pay | Admitting: Obstetrics

## 2022-06-01 ENCOUNTER — Ambulatory Visit: Payer: Medicaid Other | Attending: Maternal & Fetal Medicine

## 2022-06-01 ENCOUNTER — Other Ambulatory Visit: Payer: Self-pay

## 2022-06-01 VITALS — BP 115/62 | HR 86 | Temp 98.0°F | Ht 60.0 in | Wt 243.5 lb

## 2022-06-01 DIAGNOSIS — O285 Abnormal chromosomal and genetic finding on antenatal screening of mother: Secondary | ICD-10-CM | POA: Diagnosis not present

## 2022-06-01 DIAGNOSIS — O99013 Anemia complicating pregnancy, third trimester: Secondary | ICD-10-CM

## 2022-06-01 DIAGNOSIS — D649 Anemia, unspecified: Secondary | ICD-10-CM | POA: Diagnosis not present

## 2022-06-01 DIAGNOSIS — O99213 Obesity complicating pregnancy, third trimester: Secondary | ICD-10-CM

## 2022-06-01 DIAGNOSIS — S334XXA Traumatic rupture of symphysis pubis, initial encounter: Secondary | ICD-10-CM

## 2022-06-01 DIAGNOSIS — D582 Other hemoglobinopathies: Secondary | ICD-10-CM

## 2022-06-01 DIAGNOSIS — R87613 High grade squamous intraepithelial lesion on cytologic smear of cervix (HGSIL): Secondary | ICD-10-CM

## 2022-06-01 DIAGNOSIS — F39 Unspecified mood [affective] disorder: Secondary | ICD-10-CM | POA: Diagnosis not present

## 2022-06-01 DIAGNOSIS — O0990 Supervision of high risk pregnancy, unspecified, unspecified trimester: Secondary | ICD-10-CM

## 2022-06-01 DIAGNOSIS — Z3A35 35 weeks gestation of pregnancy: Secondary | ICD-10-CM

## 2022-06-01 DIAGNOSIS — E669 Obesity, unspecified: Secondary | ICD-10-CM

## 2022-06-04 ENCOUNTER — Encounter: Payer: Self-pay | Admitting: Obstetrics and Gynecology

## 2022-06-04 ENCOUNTER — Observation Stay
Admission: EM | Admit: 2022-06-04 | Discharge: 2022-06-05 | Disposition: A | Discharge status: Home / Self Care | Payer: Medicaid Other | Attending: Obstetrics and Gynecology | Admitting: Obstetrics and Gynecology

## 2022-06-04 ENCOUNTER — Other Ambulatory Visit: Payer: Self-pay

## 2022-06-04 DIAGNOSIS — R87613 High grade squamous intraepithelial lesion on cytologic smear of cervix (HGSIL): Secondary | ICD-10-CM

## 2022-06-04 DIAGNOSIS — O4703 False labor before 37 completed weeks of gestation, third trimester: Secondary | ICD-10-CM | POA: Diagnosis present

## 2022-06-04 DIAGNOSIS — O0990 Supervision of high risk pregnancy, unspecified, unspecified trimester: Secondary | ICD-10-CM

## 2022-06-04 DIAGNOSIS — Z3A35 35 weeks gestation of pregnancy: Secondary | ICD-10-CM | POA: Diagnosis not present

## 2022-06-04 DIAGNOSIS — Z7982 Long term (current) use of aspirin: Secondary | ICD-10-CM | POA: Insufficient documentation

## 2022-06-04 DIAGNOSIS — S334XXA Traumatic rupture of symphysis pubis, initial encounter: Secondary | ICD-10-CM

## 2022-06-04 DIAGNOSIS — O99213 Obesity complicating pregnancy, third trimester: Secondary | ICD-10-CM

## 2022-06-04 NOTE — OB Triage Note (Signed)
Cindy [name] 26 y.o. presents to Labor & Delivery triage via wheelchair steered by ED staff reporting contractions every 3 minutes. She states they began around 78. She states she was on her feet more than usual today d/t her daughter's birthday party. She states the contractions have been accompanied by constant nausea. She is a G3P2002 at [redacted]w[redacted]d. She denies signs and symptoms consistent with rupture of membranes or active vaginal bleeding. She states she did have one small episode of spotting after using the bathroom earlier, but none since then. She endorses positive fetal movement. External FM and TOCO applied to non-tender abdomen. Initial FHR 170. Vital signs obtained and within normal limits. Patient oriented to care environment including call bell and bed control use. JRod Can CNM notified of patient's arrival. Plan to ***.

## 2022-06-05 DIAGNOSIS — O26893 Other specified pregnancy related conditions, third trimester: Secondary | ICD-10-CM | POA: Diagnosis not present

## 2022-06-05 DIAGNOSIS — Z3A35 35 weeks gestation of pregnancy: Secondary | ICD-10-CM | POA: Diagnosis not present

## 2022-06-05 DIAGNOSIS — O4703 False labor before 37 completed weeks of gestation, third trimester: Secondary | ICD-10-CM | POA: Diagnosis not present

## 2022-06-05 DIAGNOSIS — R109 Unspecified abdominal pain: Secondary | ICD-10-CM

## 2022-06-05 NOTE — OB Triage Note (Signed)
Pt discharged home per order.  Pt stable and ambulatory and an After Visit Summary was printed and given to the patient. Discharge education completed with patient/family including follow up instructions, appointments, and medication list. Pt received labor and bleeding precautions. Patient able to verbalize understanding, all questions fully answered upon discharge. Patient instructed to return to ED, call 911, or call MD for any changes in condition. Pt discharged home via personal vehicle with support person and all belongings.

## 2022-06-05 NOTE — Discharge Summary (Signed)
Physician Final Progress Note  Patient ID: Cindy [name] MRN: [id number] DOB/AGE: 1995-10-29 26 y.o.  Admit date: 06/04/2022 Admitting provider: Rod Can, CNM Discharge date: 06/05/2022   Admission Diagnoses:  1) intrauterine pregnancy at [redacted]w[redacted]d 2) contractions  Discharge Diagnoses:  Active Problems:   Labor and delivery, indication for care   [redacted] weeks gestation of pregnancy    History of Present Illness: The patient is a 26y.o. female G3P2002 at 370w5dho presents for every 3 minute contractions she stated that began earlier in the evening. She admits being on her feet longer than usual yesterday. She is placed on monitors in observation. All monitoring and cervical exams are reassuring and she is discharged to home with instructions and precautions. She is advised to increase hydration.   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    Past Surgical History:  Procedure Laterality Date   COLONOSCOPY WITH PROPOFOL N/A 06/15/2017   Procedure: COLONOSCOPY WITH PROPOFOL;  Surgeon: AnJonathon BellowsMD;  [facility]  Service: Gastroenterology;  Laterality: N/A;   ESOPHAGOGASTRODUODENOSCOPY (EGD) WITH PROPOFOL N/A 06/15/2017   Procedure: ESOPHAGOGASTRODUODENOSCOPY (EGD) WITH PROPOFOL;  Surgeon: AnJonathon BellowsMD;  [facility];  Service: Gastroenterology;  Laterality: N/A;   MASTECTOMY     TONSILLECTOMY      No current facility-administered medications on file prior to encounter.   Current Outpatient Medications on File Prior to Encounter  Medication Sig Dispense Refill   acetaminophen (TYLENOL) 500 MG tablet Take 500 mg by mouth every 6 (six) hours as needed for headache.      aspirin EC 81 MG tablet Take 81 mg by mouth daily. Swallow whole.     Prenatal Vit-Fe Fumarate-FA (PRENATAL MULTIVITAMIN) TABS tablet Take 1 tablet by mouth daily at 12 noon.      No Known Allergies  Social History   Socioeconomic History   Marital status: Significant Other    Spouse name: JaBuilding surveyor Number of children: 1   Years of education: Not on file   Highest education level: 9th grade  Occupational History   Not on file  Tobacco Use   Smoking status: Never   Smokeless tobacco: Never  Vaping Use   Vaping Use: Never used  Substance and Sexual Activity   Alcohol use: No    Comment: former   Drug use: Not Currently    Types: Marijuana    Comment: last used in sept   Sexual activity: Yes    Partners: Male    Birth control/protection: Injection    Comment: depo  Other Topics Concern   Not on file  Social History Narrative   Not on file   Social Determinants of Health   Financial Resource Strain: Not on file  Food Insecurity: Food Insecurity Present (08/27/2020)   Hunger Vital Sign    Worried About Running Out of Food in the Last Year: Sometimes true    Ran Out of Food in the Last Year: Often true  Transportation Needs: No Transportation Needs (08/27/2020)   PRAPARE - TrHydrologistMedical): No    Lack of Transportation (Non-Medical): No  Physical Activity: Not on file  Stress: Not on file  Social Connections: Not on file  Intimate Partner Violence: Not on file    Family History  Problem Relation Age of Onset   Thyroid disease Mother    Hypertension Mother    Sickle cell trait Mother    Sickle cell anemia Sister    Hypertension Maternal Grandmother    Breast cancer Maternal Grandmother      Review of Systems  Constitutional:  Negative for chills and fever.  HENT:  Negative for congestion, ear discharge, ear pain, hearing loss, sinus pain and sore throat.   Eyes:  Negative for blurred vision and double vision.   Respiratory:  Negative for cough, shortness of breath and wheezing.   Cardiovascular:  Negative for chest pain, palpitations and leg swelling.  Gastrointestinal:  Positive for abdominal pain. Negative for blood in stool, constipation, diarrhea, heartburn, melena, nausea and vomiting.  Genitourinary:  Negative for dysuria, flank pain, frequency, hematuria and urgency.  Musculoskeletal:  Negative for back pain, joint pain and myalgias.  Skin:  Negative for itching and rash.  Neurological:  Negative for dizziness, tingling, tremors, sensory change, speech change, focal weakness, seizures, loss of consciousness, weakness and headaches.  Endo/Heme/Allergies:  Negative for environmental allergies. Does not bruise/bleed easily.  Psychiatric/Behavioral:  Negative for depression, hallucinations, memory loss, substance abuse and suicidal ideas. The patient is not nervous/anxious and does not have insomnia.      Physical Exam: BP (!) 107/59 (BP Location: Left Wrist)   Pulse (!) 105   Temp 99.5 F (37.5 C) (Oral)   Resp 16   Ht 5' (1.524 m)   Wt 110.2 kg   LMP 10/03/2021 (Exact Date)   BMI 47.46 kg/m   Constitutional: Well nourished, well developed female in no acute distress.  HEENT: normal Skin: Warm and dry.  Respiratory:  Normal respiratory effort Abdomen: FHT present, mild to palpation Psych: Alert and Oriented x3. No memory deficits. Normal mood and affect.  MS: normal gait, normal bilateral lower extremity ROM/strength/stability.  Pelvic exam: per RN J. Guptill 1/50/ballotable/posterior  Toco: irregular, mild, lasting 20 seconds Fetal well being: reactive NST: 145 bpm, moderate variability, +accelerations, variable noted   Consults: None  Significant Findings/ Diagnostic Studies: none  Procedures: NST  Hospital Course: The patient was admitted to Labor and Delivery Triage for observation.   Discharge Condition: good  Disposition: Discharge disposition: 01-Home or Self  Care  Diet: Regular diet  Discharge Activity: Activity as tolerated   Allergies as of 06/05/2022   No Known Allergies      Medication List     ASK your doctor about these medications    acetaminophen 500 MG tablet Commonly known as: TYLENOL Take 500 mg by mouth every 6 (six) hours as needed for headache.   aspirin EC 81 MG tablet Take 81 mg by mouth daily. Swallow whole.   prenatal multivitamin Tabs tablet Take 1 tablet by mouth daily at 12 noon.         Total time spent taking care of this patient: 23 minutes  Signed: Rod Can, CNM  06/05/2022, 7:16 AM

## 2022-06-07 ENCOUNTER — Other Ambulatory Visit (HOSPITAL_COMMUNITY)
Admission: RE | Admit: 2022-06-07 | Discharge: 2022-06-07 | Disposition: A | Discharge status: Home / Self Care | Payer: Medicaid Other | Source: Ambulatory Visit | Attending: Obstetrics | Admitting: Obstetrics

## 2022-06-07 ENCOUNTER — Ambulatory Visit (INDEPENDENT_AMBULATORY_CARE_PROVIDER_SITE_OTHER): Payer: Medicaid Other | Admitting: Obstetrics

## 2022-06-07 VITALS — BP 118/78 | Wt 239.0 lb

## 2022-06-07 DIAGNOSIS — Z3A36 36 weeks gestation of pregnancy: Secondary | ICD-10-CM | POA: Diagnosis present

## 2022-06-07 DIAGNOSIS — Z113 Encounter for screening for infections with a predominantly sexual mode of transmission: Secondary | ICD-10-CM | POA: Diagnosis present

## 2022-06-07 DIAGNOSIS — O0990 Supervision of high risk pregnancy, unspecified, unspecified trimester: Secondary | ICD-10-CM | POA: Insufficient documentation

## 2022-06-07 MED ORDER — CYCLOBENZAPRINE HCL 10 MG PO TABS: 10.0000 mg | ORAL_TABLET | Freq: Every day | ORAL | 1 refills | Status: DC

## 2022-06-07 NOTE — Addendum Note (Signed)
Addended by: Imagene Riches on: 06/07/2022 02:32 PM   Modules accepted: Orders

## 2022-06-07 NOTE — Progress Notes (Signed)
GBS today. No vb. No lof.

## 2022-06-07 NOTE — Progress Notes (Signed)
Routine Prenatal Care Visit  Subjective  Cindy [name] is a 26 y.o. G3P2002 at 7w0dbeing seen today for ongoing prenatal care.  She is currently monitored for the following issues for this high-risk pregnancy and has Iron deficiency anemia; Morbid obesity with BMI of 40.0-44.9, adult (HH[place]; Hemoglobin C trait (HL[place]; Mood disorder (HV[place]; Positive pregnancy test; Supervision of high risk pregnancy, antepartum; Abdominal pain; Obesity affecting pregnancy; HGSIL on Pap smear of cervix; Swelling of lower extremity during pregnancy, antepartum; Uterine size-date discrepancy in third trimester; and Symphysis pubis disruption on their problem list.  ----------------------------------------------------------------------------------- Patient reports no complaints.  It is more difficult to her to rest due to her pregnancy.She also expresses an interest in IOL at 39 weeks. Contractions: Not present. Vag. Bleeding: None.   . Leaking Fluid denies.  ----------------------------------------------------------------------------------- The following portions of the patient's history were reviewed and updated as appropriate: allergies, current medications, past family history, past medical history, past social history, past surgical history and problem list. Problem list updated.  Objective  Blood pressure 118/78, weight 239 lb (108.4 kg), last menstrual period 10/03/2021, unknown if currently breastfeeding. Pregravid weight 230 lb (104.3 kg) Total Weight Gain 9 lb (4.082 kg) Urinalysis: Urine Protein    Urine Glucose    Fetal Status:           General:  Alert, oriented and cooperative. Patient is in no acute distress.  Skin: Skin is warm and dry. No rash noted.   Cardiovascular: Normal heart rate noted  Respiratory: Normal respiratory effort, no problems with respiration noted  Abdomen: Soft, gravid, appropriate for gestational age. Pain/Pressure: Absent     Pelvic:  Cervical exam deferred         Extremities: Normal range of motion.     Mental Status: Normal mood and affect. Normal behavior. Normal judgment and thought content.   Assessment   26y.o. [id number] 342w0dy  07/05/2022, by Ultrasound presenting for routine prenatal visit  Plan   pregnancy Problems (from 12/06/21 to present)    Problem Noted Resolved   Symphysis pubis disruption 05/10/2022 by GlRod CanCNM No   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 by FrImagene RichesCNM No   Overview Signed 03/28/2022 12:52 PM by FrImagene RichesCNM    HGSIL pap during pregnancy- needs colpo per EvAmalia Haileynd ChShinglehousentpartally      Obesity affecting pregnancy 03/10/2022 by GlRod CanCNM No   Supervision of high risk pregnancy, antepartum 12/06/2021 by FrImagene RichesCNM No   Overview Addendum 05/24/2022  1:41 PM by StOtila KluverLPN     Nursing Staff Provider  Office Location  [facility] Dating  [id number]/23  Language  English Anatomy US [place]ormal female  Flu Vaccine   Genetic Screen  NIPS: female  TDaP vaccine   04/07/22 Hgb A1C or  GTT Early :5.2 Third trimester :   Covid    LAB RESULTS   Rhogam  NA Blood Type   AB+  Feeding Plan Bottle/Breast Antibody  negative  Contraception Depo/Nexplanon Rubella  immune  Circumcision NA RPR   NR  Pediatrician  [facility] HBsAg   negative  Support Person Jaiquan Corbett HIV  negative  Prenatal Classes  Varicella immune    GBS  (For PCN allergy, check sensitivities)   BTL Consent   Has Hemoglobin C trait.  VBAC Consent  Pap 12/06/21 neg, hpv neg     Hgb Electro      CF  SMA                   Preterm labor symptoms and general obstetric precautions including but not limited to vaginal bleeding, contractions, leaking of fluid and fetal movement were reviewed in detail with the patient. Please refer to After Visit Summary for other counseling recommendations.  Her GBS and cultures are retrieved today. She has met with anesthesia. NST done today as well . she  needs these weekly.  Return in about 1 week (around 06/14/2022) for return OB.  Imagene Riches, CNM  06/07/2022 1:50 PM

## 2022-06-07 NOTE — Addendum Note (Signed)
Addended by: Brien Few on: 06/07/2022 02:34 PM   Modules accepted: Orders

## 2022-06-09 LAB — CERVICOVAGINAL ANCILLARY ONLY
Bacterial Vaginitis (gardnerella): NEGATIVE
Candida Glabrata: NEGATIVE
Candida Vaginitis: POSITIVE — AB
Chlamydia: NEGATIVE
Comment: NEGATIVE
Comment: NEGATIVE
Comment: NEGATIVE
Comment: NEGATIVE
Comment: NEGATIVE
Comment: NORMAL
Neisseria Gonorrhea: NEGATIVE
Trichomonas: NEGATIVE

## 2022-06-10 NOTE — Progress Notes (Signed)
26 yo G3 P2002 EDC 07/05/2022 HGB C trait Growth q 4wk, wkly fetal testing at 34 wks   Pt without complaint No LOF, no bleeding, having good fetal movement. No contractions AP 3 trimester doing well Continue w/routine testing Labor precautions given  RTO in 2 wks

## 2022-06-11 LAB — CULTURE, BETA STREP (GROUP B ONLY): Strep Gp B Culture: NEGATIVE

## 2022-06-12 ENCOUNTER — Encounter: Payer: Self-pay | Admitting: Obstetrics

## 2022-06-12 ENCOUNTER — Other Ambulatory Visit: Payer: Self-pay | Admitting: Obstetrics

## 2022-06-12 DIAGNOSIS — B3731 Acute candidiasis of vulva and vagina: Secondary | ICD-10-CM

## 2022-06-12 MED ORDER — TERCONAZOLE 0.4 % VA CREA: 1.0000 | TOPICAL_CREAM | Freq: Every day | VAGINAL | 0 refills | Status: DC

## 2022-06-12 NOTE — Progress Notes (Signed)
Patient's 36 week cultures reveal some yeast vaginitis. Rx for Terconazole gel prescribed and note sent to patient via [place].  Imagene Riches, CNM  06/12/2022 8:09 PM

## 2022-06-13 ENCOUNTER — Other Ambulatory Visit: Payer: Self-pay

## 2022-06-13 DIAGNOSIS — D582 Other hemoglobinopathies: Secondary | ICD-10-CM

## 2022-06-13 DIAGNOSIS — O99013 Anemia complicating pregnancy, third trimester: Secondary | ICD-10-CM

## 2022-06-13 DIAGNOSIS — O99213 Obesity complicating pregnancy, third trimester: Secondary | ICD-10-CM

## 2022-06-14 ENCOUNTER — Telehealth: Payer: Self-pay | Admitting: Obstetrics & Gynecology

## 2022-06-14 ENCOUNTER — Encounter: Payer: Medicaid Other | Admitting: Obstetrics & Gynecology

## 2022-06-14 ENCOUNTER — Other Ambulatory Visit: Payer: Medicaid Other

## 2022-06-14 NOTE — Telephone Encounter (Signed)
Reached out to pt to reschedule NST appointment and [place] appointment that was scheduled on 06/14/22 at 1:15 and and 1:55.  Left message for pt to call back to reschedule.

## 2022-06-15 ENCOUNTER — Ambulatory Visit: Payer: Medicaid Other

## 2022-06-15 ENCOUNTER — Encounter: Payer: Self-pay | Admitting: Obstetrics & Gynecology

## 2022-06-15 NOTE — Telephone Encounter (Signed)
Reached out to pt (x2) to reschedule NST appointment and [place] appointment that was scheduled on 06/14/22 at 1:15 and 1:55 respectively.  Could not leave message for pt, immediate busy signal.  Will send a MyChart letter to pt.

## 2022-06-19 ENCOUNTER — Other Ambulatory Visit: Payer: Self-pay

## 2022-06-19 ENCOUNTER — Observation Stay
Admission: EM | Admit: 2022-06-19 | Discharge: 2022-06-19 | Disposition: A | Discharge status: Home / Self Care | Payer: Medicaid Other | Attending: Obstetrics and Gynecology | Admitting: Obstetrics and Gynecology

## 2022-06-19 ENCOUNTER — Encounter: Payer: Self-pay | Admitting: Advanced Practice Midwife

## 2022-06-19 DIAGNOSIS — O99213 Obesity complicating pregnancy, third trimester: Secondary | ICD-10-CM

## 2022-06-19 DIAGNOSIS — O0990 Supervision of high risk pregnancy, unspecified, unspecified trimester: Secondary | ICD-10-CM

## 2022-06-19 DIAGNOSIS — R109 Unspecified abdominal pain: Secondary | ICD-10-CM | POA: Diagnosis not present

## 2022-06-19 DIAGNOSIS — Z3A37 37 weeks gestation of pregnancy: Secondary | ICD-10-CM

## 2022-06-19 DIAGNOSIS — O471 False labor at or after 37 completed weeks of gestation: Principal | ICD-10-CM | POA: Insufficient documentation

## 2022-06-19 DIAGNOSIS — O26893 Other specified pregnancy related conditions, third trimester: Secondary | ICD-10-CM

## 2022-06-19 DIAGNOSIS — S334XXA Traumatic rupture of symphysis pubis, initial encounter: Secondary | ICD-10-CM

## 2022-06-19 DIAGNOSIS — Z7982 Long term (current) use of aspirin: Secondary | ICD-10-CM | POA: Insufficient documentation

## 2022-06-19 DIAGNOSIS — O479 False labor, unspecified: Secondary | ICD-10-CM | POA: Diagnosis present

## 2022-06-19 DIAGNOSIS — R87613 High grade squamous intraepithelial lesion on cytologic smear of cervix (HGSIL): Secondary | ICD-10-CM

## 2022-06-19 NOTE — Progress Notes (Signed)
Pt called RN to the bedside and states that she drank her water and her juice and wanted to know what the plan was. RN asked the patient if her pain was better and the patient said "no the pain is worse and I just really feel like I need to push. I really think I am going to go have a second opinion somewhere because I can't keep getting sent home being in this much pain". RN explained to the patient that if she left without a discharge order, she would need to sign an AMA form. RN also explained to the patient that she was going to touch base with the midwife and see if there was anything else we could do for her. Pt stated she would like to touch base with the midwife. Midwife notified. Will continue to monitor.

## 2022-06-19 NOTE — Discharge Summary (Signed)
RN reviewed discharge instructions with patient. Gave pt opportunity for questions. All questions answered at this time. Pt verbalized understanding. Pt discharged home.

## 2022-06-19 NOTE — Discharge Summary (Signed)
Physician Final Progress Note  Patient ID: Cindy [name] MRN: [id number] DOB/AGE: [birth year] 26 y.o.  Admit date: 06/19/2022 Admitting provider: Rod Can, CNM Discharge date: 06/19/2022   Admission Diagnoses:  1) intrauterine pregnancy at [redacted]w[redacted]d 2) uterine contractions  Discharge Diagnoses:  Principal Problem:   Uterine contractions Active Problems:   [redacted] weeks gestation of pregnancy    History of Present Illness: The patient is a 26y.o. female G3P2002 at 373w5dho presents for regular painful contractions that started this morning. The pain is in her back and lower abdomen. She hasn't timed them although thinks they are frequent. She reports positive fetal movement although not as much when laying on her side. She denies vaginal bleeding or leakage of fluid. She was admitted for observation and placed on monitors. Cervical exam on admission and prior to discharge is unchanged. She was unable to pee while in triage, however, states she drinks water all day. We discussed possible need for IV fluids. She declines and agrees to increase PO hydration. She is frustrated about the pain without cervical change. Reviewed possible prodromal early labor vs dehydration cramping. Offered Rx oxy enough for the day. She declines medication and is ready to discharge to home.   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    Past Surgical History:  Procedure Laterality Date   COLONOSCOPY WITH PROPOFOL N/A 06/15/2017   Procedure: COLONOSCOPY WITH PROPOFOL;  Surgeon: AnJonathon BellowsMD;  [facility];  Service: Gastroenterology;  Laterality: N/A;   ESOPHAGOGASTRODUODENOSCOPY (EGD) WITH PROPOFOL N/A 06/15/2017    Procedure: ESOPHAGOGASTRODUODENOSCOPY (EGD) WITH PROPOFOL;  Surgeon: AnJonathon BellowsMD;  [facility];  Service: Gastroenterology;  Laterality: N/A;   MASTECTOMY     TONSILLECTOMY      No current facility-administered medications on file prior to encounter.   Current Outpatient Medications on File Prior to Encounter  Medication Sig Dispense Refill   aspirin EC 81 MG tablet Take 81 mg by mouth daily. Swallow whole.     Prenatal Vit-Fe Fumarate-FA (PRENATAL MULTIVITAMIN) TABS tablet Take 1 tablet by mouth daily at 12 noon.     acetaminophen (TYLENOL) 500 MG tablet Take 500 mg by mouth every 6 (six) hours as needed for headache.     cyclobenzaprine (FLEXERIL) 10 MG tablet Take 1 tablet (10 mg total) by mouth at bedtime. 15 tablet 1   terconazole (TERAZOL 7) 0.4 % vaginal cream Place 1 applicator vaginally at bedtime. 45 g 0    No Known Allergies  Social History   Socioeconomic History   Marital status: Significant Other    Spouse name: JaBuilding surveyor Number of children: 1   Years of education: Not on file   Highest education level: 9th grade  Occupational History   Not on file  Tobacco Use   Smoking status: Never   Smokeless tobacco: Never  Vaping Use   Vaping Use: Never used  Substance and Sexual Activity   Alcohol use: No    Comment: former   Drug use: Not Currently    Types: Marijuana    Comment: last used in sept   Sexual activity: Yes  Partners: Male    Birth control/protection: Injection    Comment: depo  Other Topics Concern   Not on file  Social History Narrative   Not on file   Social Determinants of Health   Financial Resource Strain: Not on file  Food Insecurity: Food Insecurity Present (08/27/2020)   Hunger Vital Sign    Worried About Running Out of Food in the Last Year: Sometimes true    Ran Out of Food in the Last Year: Often true  Transportation Needs: No Transportation Needs (08/27/2020)   PRAPARE - Radiographer, therapeutic (Medical): No    Lack of Transportation (Non-Medical): No  Physical Activity: Not on file  Stress: Not on file  Social Connections: Not on file  Intimate Partner Violence: Not on file    Family History  Problem Relation Age of Onset   Thyroid disease Mother    Hypertension Mother    Sickle cell trait Mother    Sickle cell anemia Sister    Hypertension Maternal Grandmother    Breast cancer Maternal Grandmother      Review of Systems  Constitutional:  Negative for chills and fever.  HENT:  Negative for congestion, ear discharge, ear pain, hearing loss, sinus pain and sore throat.   Eyes:  Negative for blurred vision and double vision.  Respiratory:  Negative for cough, shortness of breath and wheezing.   Cardiovascular:  Negative for chest pain, palpitations and leg swelling.  Gastrointestinal:  Positive for abdominal pain. Negative for blood in stool, constipation, diarrhea, heartburn, melena, nausea and vomiting.  Genitourinary:  Negative for dysuria, flank pain, frequency, hematuria and urgency.  Musculoskeletal:  Positive for back pain. Negative for joint pain and myalgias.  Skin:  Negative for itching and rash.  Neurological:  Negative for dizziness, tingling, tremors, sensory change, speech change, focal weakness, seizures, loss of consciousness, weakness and headaches.  Endo/Heme/Allergies:  Negative for environmental allergies. Does not bruise/bleed easily.  Psychiatric/Behavioral:  Negative for depression, hallucinations, memory loss, substance abuse and suicidal ideas. The patient is not nervous/anxious and does not have insomnia.      Physical Exam: BP (!) 111/58 (BP Location: Right Arm)   Pulse 81   Temp 98.1 F (36.7 C) (Oral)   Resp 16   Ht 5' (1.524 m)   Wt 110.2 kg   LMP 10/03/2021 (Exact Date)   SpO2 100%   BMI 47.46 kg/m   Constitutional: Well nourished, well developed female in no acute distress.  HEENT: normal Skin: Warm and dry.   Cardiovascular: Regular rate and rhythm.   Extremity:  trace edema   Respiratory: Clear to auscultation bilateral. Normal respiratory effort Abdomen: FHT present Back: no CVAT Neuro: DTRs 2+, Cranial nerves grossly intact Psych: Alert and Oriented x3. No memory deficits. Normal mood and affect.    Pelvic exam: (female chaperone present) is not limited by body habitus EGBUS: within normal limits Vagina: within normal limits and with normal mucosa, thin white discharge Cervix: 1.5/40-50/-3  Toco: every 2-20 minutes lasting 30 to 70 seconds, mild to palpation Fetal well being: reactive NST, 130 bpm, moderate variability, +accelerations, -decelerations   Consults: None  Significant Findings/ Diagnostic Studies: none  Procedures: NST  Hospital Course: The patient was admitted to Labor and Delivery Triage for observation.   Discharge Condition: good  Disposition: Discharge disposition: 01-Home or Self Care  Diet: Regular diet  Discharge Activity: Activity as tolerated  Discharge Instructions     Discharge activity:  No  Restrictions   Complete by: As directed    Rest as needed, tub soaks, tylenol PM to help you sleep   Discharge diet:  No restrictions   Complete by: As directed    Stay well hydrated      Allergies as of 06/19/2022   No Known Allergies      Medication List     TAKE these medications    acetaminophen 500 MG tablet Commonly known as: TYLENOL Take 500 mg by mouth every 6 (six) hours as needed for headache.   aspirin EC 81 MG tablet Take 81 mg by mouth daily. Swallow whole.   cyclobenzaprine 10 MG tablet Commonly known as: FLEXERIL Take 1 tablet (10 mg total) by mouth at bedtime.   prenatal multivitamin Tabs tablet Take 1 tablet by mouth daily at 12 noon.   terconazole 0.4 % vaginal cream Commonly known as: TERAZOL 7 Place 1 applicator vaginally at bedtime.         Total time spent taking care of this patient: 24  minutes  Signed: Rod Can, CNM  06/19/2022, 3:16 PM

## 2022-06-19 NOTE — OB Triage Note (Signed)
Pt presents c/o ctx that started around 10:30 am. Pt states she can't tell how far apart they are. Pt denies bleeding or LOF. Reports positive fetal movement. VSS. Will continue to monitor.

## 2022-06-20 ENCOUNTER — Encounter: Payer: Self-pay | Admitting: Obstetrics & Gynecology

## 2022-06-20 ENCOUNTER — Encounter: Payer: Self-pay | Admitting: Obstetrics

## 2022-06-23 ENCOUNTER — Ambulatory Visit (INDEPENDENT_AMBULATORY_CARE_PROVIDER_SITE_OTHER): Payer: Medicaid Other | Admitting: Obstetrics

## 2022-06-23 ENCOUNTER — Other Ambulatory Visit: Payer: Medicaid Other

## 2022-06-23 VITALS — BP 122/80 | Wt 243.0 lb

## 2022-06-23 DIAGNOSIS — O0990 Supervision of high risk pregnancy, unspecified, unspecified trimester: Secondary | ICD-10-CM

## 2022-06-23 DIAGNOSIS — O99213 Obesity complicating pregnancy, third trimester: Secondary | ICD-10-CM

## 2022-06-23 DIAGNOSIS — O26843 Uterine size-date discrepancy, third trimester: Secondary | ICD-10-CM | POA: Diagnosis not present

## 2022-06-23 LAB — POCT URINALYSIS DIPSTICK OB
Appearance: ABNORMAL
Bilirubin, UA: NEGATIVE
Blood, UA: NEGATIVE
Glucose, UA: NEGATIVE
Leukocytes, UA: NEGATIVE
Nitrite, UA: NEGATIVE
Odor: NORMAL
Spec Grav, UA: 1.02 (ref 1.010–1.025)
Urobilinogen, UA: 0.2 E.U./dL
pH, UA: 5 (ref 5.0–8.0)

## 2022-06-23 NOTE — Progress Notes (Signed)
Routine Prenatal Care Visit  Subjective  Cindy [name] is a 26 y.o. G3P2002 at 52w2dbeing seen today for ongoing prenatal care.  She is currently monitored for the following issues for this high-risk pregnancy and has Iron deficiency anemia; Morbid obesity with BMI of 40.0-44.9, adult ([place]; Hemoglobin C trait ([place]; Mood disorder ([place]; Positive pregnancy test; Supervision of high risk pregnancy, antepartum; Abdominal pain; Obesity affecting pregnancy; HGSIL on Pap smear of cervix; Swelling of lower extremity during pregnancy, antepartum; Uterine size-date discrepancy in third trimester; Symphysis pubis disruption; Uterine contractions; and [redacted] weeks gestation of pregnancy on their problem list.  ----------------------------------------------------------------------------------- Patient reports backache, occasional contractions and she has transportation problems for her visit with MFM for next Monday. Also very much wants her IOLl set up for [redacted] weeks gestation..   Contractions: Irritability. Vag. Bleeding: None.  Movement: Present. Leaking Fluid denies.  ----------------------------------------------------------------------------------- The following portions of the patient's history were reviewed and updated as appropriate: allergies, current medications, past family history, past medical history, past social history, past surgical history and problem list. Problem list updated.  Objective  Blood pressure 122/80, weight 243 lb (110.2 kg), last menstrual period 10/03/2021, unknown if currently breastfeeding. Pregravid weight 230 lb (104.3 kg) Total Weight Gain 13 lb (5.897 kg) Urinalysis: Urine Protein Trace  Urine Glucose Negative  Fetal Status:     Movement: Present     General:  Alert, oriented and cooperative. Patient is in no acute distress.  Skin: Skin is warm and dry. No rash noted.   Cardiovascular: Normal heart rate noted  Respiratory: Normal respiratory effort, no problems with  respiration noted  Abdomen: Soft, gravid, appropriate for gestational age. Pain/Pressure: Present     Pelvic:  1.5-2cms/40%/-3 station. cervix is softening, and is quite posterior.        Extremities: Normal range of motion.     Mental Status: Normal mood and affect. Normal behavior. Normal judgment and thought content.   Assessment   26y.o. [id number] 362w2dy  07/05/2022, by Ultrasound presenting for routine prenatal visit  Plan   pregnancy Problems (from 12/06/21 to present)    Problem Noted Resolved   Symphysis pubis disruption 05/10/2022 by GlRod CanCNM No   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 by FrImagene RichesCNM No   Overview Signed 03/28/2022 12:52 PM by FrImagene RichesCNM    HGSIL pap during pregnancy- needs colpo per EvAmalia Haileynd ChRed Jacketntpartally      Obesity affecting pregnancy 03/10/2022 by GlRod CanCNM No   Supervision of high risk pregnancy, antepartum 12/06/2021 by FrImagene RichesCNM No   Overview Addendum 06/12/2022  8:01 PM by FrImagene RichesCNM     Nursing Staff Provider  Office Location  [facility] Dating  [id number]/23  Language  English Anatomy US [place]ormal female  Flu Vaccine   Genetic Screen  NIPS: female  TDaP vaccine   04/07/22 Hgb A1C or  GTT Early :5.2 Third trimester :   Covid    LAB RESULTS   Rhogam  NA Blood Type   AB+  Feeding Plan Bottle/Breast Antibody  negative  Contraception Depo/Nexplanon Rubella  immune  Circumcision NA RPR   NR  Pediatrician  [facility] HBsAg   negative  Support Person JaiTeacher, musicV  negative  Prenatal Classes  Varicella immune    GBS  (For PCN allergy, check sensitivities) negative  BTL Consent   Has Hemoglobin C trait.  VBAC Consent  Pap 12/06/21  neg, hpv neg     Hgb Electro      CF      SMA                   Term labor symptoms and general obstetric precautions including but not limited to vaginal bleeding, contractions, leaking of fluid and fetal movement were reviewed in detail  with the patient. Please refer to After Visit Summary for other counseling recommendations.  We have negotiated with her, and will concel her MFM appt on Monday. She wi.l RTC for an NST on Monday instead, and her IOL is set for Wednesday at 0800. MDs to be notified.  No follow-ups on file.  Imagene Riches, CNM  06/23/2022 2:20 PM

## 2022-06-26 ENCOUNTER — Ambulatory Visit: Payer: Medicaid Other

## 2022-06-26 ENCOUNTER — Other Ambulatory Visit: Payer: Medicaid Other

## 2022-06-27 ENCOUNTER — Ambulatory Visit (INDEPENDENT_AMBULATORY_CARE_PROVIDER_SITE_OTHER): Payer: Medicaid Other

## 2022-06-27 ENCOUNTER — Encounter: Payer: Self-pay | Admitting: Obstetrics

## 2022-06-27 DIAGNOSIS — O99213 Obesity complicating pregnancy, third trimester: Secondary | ICD-10-CM | POA: Diagnosis not present

## 2022-06-27 DIAGNOSIS — O26843 Uterine size-date discrepancy, third trimester: Secondary | ICD-10-CM

## 2022-06-27 DIAGNOSIS — O0990 Supervision of high risk pregnancy, unspecified, unspecified trimester: Secondary | ICD-10-CM

## 2022-06-27 NOTE — Progress Notes (Cosign Needed Addendum)
Subjective:    Cindy [name] is a 26 y.o. female who presents for fetal monitoring per order from Gigi Gin, CNM.    Results reviewed with Dr. Rosario Adie and discussed with patient.    Reactive strip Baseline 140s Accel 155 Decels none

## 2022-06-28 ENCOUNTER — Other Ambulatory Visit: Payer: Self-pay

## 2022-06-28 ENCOUNTER — Encounter: Payer: Self-pay | Admitting: Advanced Practice Midwife

## 2022-06-28 ENCOUNTER — Inpatient Hospital Stay
Admission: RE | Admit: 2022-06-28 | Discharge: 2022-06-30 | DRG: 807 | Disposition: A | Discharge status: Home / Self Care | Payer: Medicaid Other | Attending: Certified Nurse Midwife | Admitting: Certified Nurse Midwife

## 2022-06-28 ENCOUNTER — Inpatient Hospital Stay: Payer: Medicaid Other | Admitting: Anesthesiology

## 2022-06-28 DIAGNOSIS — O99214 Obesity complicating childbirth: Principal | ICD-10-CM | POA: Diagnosis present

## 2022-06-28 DIAGNOSIS — D509 Iron deficiency anemia, unspecified: Secondary | ICD-10-CM | POA: Diagnosis present

## 2022-06-28 DIAGNOSIS — Z3A39 39 weeks gestation of pregnancy: Secondary | ICD-10-CM

## 2022-06-28 DIAGNOSIS — O0990 Supervision of high risk pregnancy, unspecified, unspecified trimester: Secondary | ICD-10-CM

## 2022-06-28 DIAGNOSIS — Z7982 Long term (current) use of aspirin: Secondary | ICD-10-CM

## 2022-06-28 DIAGNOSIS — O09293 Supervision of pregnancy with other poor reproductive or obstetric history, third trimester: Secondary | ICD-10-CM | POA: Diagnosis not present

## 2022-06-28 DIAGNOSIS — Z23 Encounter for immunization: Secondary | ICD-10-CM

## 2022-06-28 DIAGNOSIS — R87613 High grade squamous intraepithelial lesion on cytologic smear of cervix (HGSIL): Secondary | ICD-10-CM

## 2022-06-28 DIAGNOSIS — Z3490 Encounter for supervision of normal pregnancy, unspecified, unspecified trimester: Secondary | ICD-10-CM | POA: Diagnosis present

## 2022-06-28 DIAGNOSIS — O9902 Anemia complicating childbirth: Secondary | ICD-10-CM | POA: Diagnosis present

## 2022-06-28 DIAGNOSIS — S334XXA Traumatic rupture of symphysis pubis, initial encounter: Secondary | ICD-10-CM

## 2022-06-28 DIAGNOSIS — O99213 Obesity complicating pregnancy, third trimester: Secondary | ICD-10-CM

## 2022-06-28 LAB — TYPE AND SCREEN
ABO/RH(D): AB POS
Antibody Screen: NEGATIVE

## 2022-06-28 LAB — CBC
HCT: 33.5 % — ABNORMAL LOW (ref 36.0–46.0)
Hemoglobin: 11.1 g/dL — ABNORMAL LOW (ref 12.0–15.0)
MCH: 20.9 pg — ABNORMAL LOW (ref 26.0–34.0)
MCHC: 33.1 g/dL (ref 30.0–36.0)
MCV: 63.2 fL — ABNORMAL LOW (ref 80.0–100.0)
Platelets: 289 10*3/uL (ref 150–400)
RBC: 5.3 MIL/uL — ABNORMAL HIGH (ref 3.87–5.11)
RDW: 18.6 % — ABNORMAL HIGH (ref 11.5–15.5)
WBC: 9.2 10*3/uL (ref 4.0–10.5)
nRBC: 0 % (ref 0.0–0.2)

## 2022-06-28 MED ORDER — EPHEDRINE 5 MG/ML INJ: 10.0000 mg | INTRAVENOUS | Status: DC | PRN

## 2022-06-28 MED ORDER — OXYTOCIN-SODIUM CHLORIDE 30-0.9 UT/500ML-% IV SOLN
2.5000 [IU]/h | INTRAVENOUS | Status: DC
  Administered 2022-06-29: 2.5 [IU]/h via INTRAVENOUS

## 2022-06-28 MED ORDER — AMMONIA AROMATIC IN INHA
RESPIRATORY_TRACT | Status: AC
  Filled 2022-06-28: qty 10

## 2022-06-28 MED ORDER — LACTATED RINGERS IV SOLN
500.0000 mL | Freq: Once | INTRAVENOUS | Status: AC
  Administered 2022-06-28: 500 mL via INTRAVENOUS

## 2022-06-28 MED ORDER — MISOPROSTOL 50MCG HALF TABLET
50.0000 ug | ORAL_TABLET | Freq: Once | ORAL | Status: AC
  Administered 2022-06-28: 50 ug via ORAL
  Filled 2022-06-28: qty 1

## 2022-06-28 MED ORDER — PHENYLEPHRINE 80 MCG/ML (10ML) SYRINGE FOR IV PUSH (FOR BLOOD PRESSURE SUPPORT): 80.0000 ug | PREFILLED_SYRINGE | INTRAVENOUS | Status: DC | PRN

## 2022-06-28 MED ORDER — LACTATED RINGERS IV SOLN: INTRAVENOUS | Status: DC

## 2022-06-28 MED ORDER — OXYTOCIN-SODIUM CHLORIDE 30-0.9 UT/500ML-% IV SOLN
INTRAVENOUS | Status: AC
  Filled 2022-06-28: qty 1000

## 2022-06-28 MED ORDER — LIDOCAINE HCL (PF) 1 % IJ SOLN: 30.0000 mL | INTRAMUSCULAR | Status: DC | PRN

## 2022-06-28 MED ORDER — TERBUTALINE SULFATE 1 MG/ML IJ SOLN: 0.2500 mg | Freq: Once | INTRAMUSCULAR | Status: DC | PRN

## 2022-06-28 MED ORDER — MISOPROSTOL 200 MCG PO TABS
ORAL_TABLET | ORAL | Status: AC
  Filled 2022-06-28: qty 4

## 2022-06-28 MED ORDER — LIDOCAINE HCL (PF) 1 % IJ SOLN
INTRAMUSCULAR | Status: AC
  Filled 2022-06-28: qty 30

## 2022-06-28 MED ORDER — LACTATED RINGERS IV SOLN
500.0000 mL | INTRAVENOUS | Status: DC | PRN
  Administered 2022-06-28: 500 mL via INTRAVENOUS
  Administered 2022-06-28: 1000 mL via INTRAVENOUS

## 2022-06-28 MED ORDER — LIDOCAINE-EPINEPHRINE (PF) 1.5 %-1:200000 IJ SOLN
INTRAMUSCULAR | Status: DC | PRN
  Administered 2022-06-28: 3 mL via EPIDURAL

## 2022-06-28 MED ORDER — SODIUM CHLORIDE 0.9 % IV SOLN
INTRAVENOUS | Status: DC | PRN
  Administered 2022-06-28 (×2): 5 mL via EPIDURAL

## 2022-06-28 MED ORDER — FENTANYL-BUPIVACAINE-NACL 0.5-0.125-0.9 MG/250ML-% EP SOLN
12.0000 mL/h | EPIDURAL | Status: DC | PRN
  Administered 2022-06-28: 12 mL/h via EPIDURAL

## 2022-06-28 MED ORDER — OXYTOCIN-SODIUM CHLORIDE 30-0.9 UT/500ML-% IV SOLN
1.0000 m[IU]/min | INTRAVENOUS | Status: DC
  Administered 2022-06-28: 2 m[IU]/min via INTRAVENOUS

## 2022-06-28 MED ORDER — LIDOCAINE HCL (PF) 1 % IJ SOLN
INTRAMUSCULAR | Status: DC | PRN
  Administered 2022-06-28: 1 mL via SUBCUTANEOUS

## 2022-06-28 MED ORDER — OXYTOCIN BOLUS FROM INFUSION
333.0000 mL | Freq: Once | INTRAVENOUS | Status: AC
  Administered 2022-06-29: 333 mL via INTRAVENOUS

## 2022-06-28 MED ORDER — OXYTOCIN 10 UNIT/ML IJ SOLN
INTRAMUSCULAR | Status: AC
  Filled 2022-06-28: qty 2

## 2022-06-28 MED ORDER — ACETAMINOPHEN 325 MG PO TABS: 650.0000 mg | ORAL_TABLET | ORAL | Status: DC | PRN

## 2022-06-28 MED ORDER — FENTANYL-BUPIVACAINE-NACL 0.5-0.125-0.9 MG/250ML-% EP SOLN
EPIDURAL | Status: AC
  Filled 2022-06-28: qty 250

## 2022-06-28 MED ORDER — ONDANSETRON HCL 4 MG/2ML IJ SOLN: 4.0000 mg | Freq: Four times a day (QID) | INTRAMUSCULAR | Status: DC | PRN

## 2022-06-28 MED ORDER — DIPHENHYDRAMINE HCL 50 MG/ML IJ SOLN: 12.5000 mg | INTRAMUSCULAR | Status: DC | PRN

## 2022-06-28 MED ORDER — MISOPROSTOL 25 MCG QUARTER TABLET
25.0000 ug | ORAL_TABLET | Freq: Once | ORAL | Status: AC
  Administered 2022-06-28: 25 ug via VAGINAL
  Filled 2022-06-28: qty 1

## 2022-06-28 NOTE — Progress Notes (Signed)
  Labor Progress Note   26 y.o. T7R1165 @ [redacted]w[redacted]d, admitted for  Pregnancy, Labor Management. IOL; obesity  Subjective:  Coping ok with contractions  Objective:  BP 98/72   Pulse 81   Temp 97.9 F (36.6 C)   Resp 17   Ht 5' (1.524 m)   Wt 110.2 kg   LMP 10/03/2021 (Exact Date)   BMI 47.46 kg/m  Abd: gravid, ND, FHT present, mild tenderness on exam Extr: trace to 1+ bilateral pedal edema SVE: CERVIX: 3 cm dilated, 60-70 effaced, -2 station Cervical sweep  EFM: FHR: 140 bpm, variability: minimal to moderate,  accelerations:  Present,  decelerations:  Absent Toco: Frequency: Every every 1-2 minutes Labs: I have reviewed the patient's lab results.   Assessment & Plan:  [id number]@ 373w0dadmitted for  Pregnancy and Labor/Delivery Management  1. Pain management:  position changes . 2. FWB: FHT category II/overall reassuring.  3. ID: GBS negative 4. Labor management: s/p cervical sweep, start pitocin when appropriate, consider AROM with next cervical exam  All discussed with patient, see orders   JaRod CanCNEagarvilleroup 06/28/2022  2:10 PM

## 2022-06-28 NOTE — H&P (Signed)
OB History & Physical   History of Present Illness:  Chief Complaint: induction of labor: obesity  HPI:  Cindy [name] is a 26 y.o. G88P2002 female at 12w0ddated by 5 week ultrasound.  Her pregnancy has been complicated by Iron deficiency anemia; Morbid obesity with BMI of 40.0-44.9, adult ([place]; Hemoglobin C trait (HC[place]; Mood disorder ([place]; Supervision of high risk pregnancy, antepartum; Abdominal pain; Obesity affecting pregnancy; HGSIL on Pap smear of cervix; Swelling of lower extremity during pregnancy, antepartum; Uterine size-date discrepancy in third trimester; Symphysis pubis disruption.   She reports irregular contractions.   She denies leakage of fluid.   She denies vaginal bleeding.   She reports fetal movement.    Total weight gain for pregnancy: 5.897 kg  Most recent Growth scan 05/18/2022: 23%, AFI 8.3 cm  Obstetrical Problem List: pregnancy Problems (from 12/06/21 to present)     Problem Noted Resolved   Symphysis pubis disruption 05/10/2022 by GRod Can CNM No   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 by FImagene Riches CNM No   Overview Signed 03/28/2022 12:52 PM by FImagene Riches CNM    HGSIL pap during pregnancy- needs colpo per EAmalia Haileyand CPaxtangantpartally      Obesity affecting pregnancy 03/10/2022 by GRod Can CNM No   Supervision of high risk pregnancy, antepartum 12/06/2021 by FImagene Riches CNM No   Overview Addendum 06/12/2022  8:01 PM by FImagene Riches CNM     Nursing Staff Provider  Office Location  [facility] Dating  529w6d/24/23  Language  English Anatomy US[place]Normal female  Flu Vaccine   Genetic Screen  NIPS: female  TDaP vaccine   04/07/22 Hgb A1C or  GTT Early :5.2 Third trimester :   Covid    LAB RESULTS   Rhogam  NA Blood Type   AB+  Feeding Plan Bottle/Breast Antibody  negative  Contraception Depo/Nexplanon Rubella  immune  Circumcision NA RPR   NR  Pediatrician  [facility] HBsAg   negative  Support Person Jaiquan Corbett HIV   negative  Prenatal Classes  Varicella immune    GBS  (For PCN allergy, check sensitivities) negative  BTL Consent   Has Hemoglobin C trait.  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    Past Surgical History:  Procedure Laterality Date   COLONOSCOPY WITH PROPOFOL N/A 06/15/2017   Procedure: COLONOSCOPY WITH PROPOFOL;  Surgeon: AnJonathon BellowsMD;  [facility];  Service: Gastroenterology;  Laterality: N/A;   ESOPHAGOGASTRODUODENOSCOPY (EGD) WITH PROPOFOL N/A 06/15/2017   Procedure: ESOPHAGOGASTRODUODENOSCOPY (EGD) WITH PROPOFOL;  Surgeon: AnJonathon BellowsMD;  [facility];  Service: Gastroenterology;  Laterality: N/A;   MASTECTOMY     TONSILLECTOMY      No Known Allergies  Prior to Admission medications   Medication Sig Start Date End Date Taking? Authorizing Provider  acetaminophen (  TYLENOL) 500 MG tablet Take 500 mg by mouth every 6 (six) hours as needed for headache.   Yes [provider]  aspirin EC 81 MG tablet Take 81 mg by mouth daily. Swallow whole.   Yes [provider]  Prenatal Vit-Fe Fumarate-FA (PRENATAL MULTIVITAMIN) TABS tablet Take 1 tablet by mouth daily at 12 noon.   Yes [provider]  cyclobenzaprine (FLEXERIL) 10 MG tablet Take 1 tablet (10 mg total) by mouth at bedtime. 06/07/22   Imagene Riches, CNM  terconazole (TERAZOL 7) 0.4 % vaginal cream Place 1 applicator vaginally at bedtime. 06/12/22   Imagene Riches, CNM    OB History  [place] Term Preterm AB Living  '3 2 2     2  '$ SAB IAB Ectopic Multiple  Live Births        0 2    # Outcome Date GA Lbr Len/2nd Weight Sex Delivery Anes PTL Lv  3 Current           2 Term 06/05/19 86w1d403:54 / 03:46 3030 g F Vag-Spont EPI  LIV  1 Term 10/24/16 426w5d7:53 / 00:07 3230 g F Vag-Spont EPI  LIV    Prenatal care site: [facility]  Social History: She  reports that she has never smoked. She has never used smokeless tobacco. She reports that she does not currently use drugs after having used the following drugs: Marijuana. She reports that she does not drink alcohol.  Family History: family history includes Breast cancer in her maternal grandmother; Hypertension in her maternal grandmother and mother; Sickle cell anemia in her sister; Sickle cell trait in her mother; Thyroid disease in her mother.    Review of Systems:  Review of Systems  Constitutional:  Negative for chills and fever.  HENT:  Negative for congestion, ear discharge, ear pain, hearing loss, sinus pain and sore throat.   Eyes:  Negative for blurred vision and double vision.  Respiratory:  Negative for cough, shortness of breath and wheezing.   Cardiovascular:  Negative for chest pain, palpitations and leg swelling.  Gastrointestinal:  Positive for abdominal pain. Negative for blood in stool, constipation, diarrhea, heartburn, melena, nausea and vomiting.  Genitourinary:  Negative for dysuria, flank pain, frequency, hematuria and urgency.  Musculoskeletal:  Negative for back pain, joint pain and myalgias.  Skin:  Negative for itching and rash.  Neurological:  Negative for dizziness, tingling, tremors, sensory change, speech change, focal weakness, seizures, loss of consciousness, weakness and headaches.  Endo/Heme/Allergies:  Negative for environmental allergies. Does not bruise/bleed easily.  Psychiatric/Behavioral:  Negative for depression, hallucinations, memory loss, substance abuse and suicidal ideas. The patient is not nervous/anxious and does not have insomnia.       Physical Exam:   Vital Signs: BP 131/62 (BP Location: Right Arm)   Pulse 72   Temp 98.2 F (36.8 C) (Oral)   Resp 16   Ht 5' (1.524 m)   Wt 110.2 kg   LMP 10/03/2021 (Exact Date)   BMI 47.46 kg/m  Constitutional: obese female in no acute distress.  HEENT: normal Skin: Warm and dry.  Cardiovascular: Regular rate and rhythm.   Extremity:  trace edema   Respiratory: Clear to auscultation bilateral. Normal respiratory effort Abdomen: FHT present Back: no CVAT Neuro: DTRs 2+, Cranial nerves grossly intact Psych: Alert and Oriented x3. No memory deficits. Normal mood and affect.  MS: normal gait, normal bilateral lower extremity ROM/strength/stability.  Pelvic exam: (female chaperone present) is not  limited by body habitus EGBUS: within normal limits Vagina: within normal limits and with normal mucosa  Cervix: 3/50/-2   Baseline FHR: 125 beats/min   Variability: moderate   Accelerations: present   Decelerations: absent Contractions: present frequency: every 5-8 Overall assessment: reassuring   Lab Results  Component Value Date   SARSCOV2NAA NEGATIVE 06/04/2019    Assessment:  WILBUR LABUDA is a 26 y.o. G53P2002 female at 64w0dwith induction of labor: obesity.   Plan:  Admit to Labor & Delivery  CBC, T&S, Clrs, IVF GBS negative.   Fetal well-being: Category I Begin induction with cytotec    JRod Can CNM 06/28/2022 8:34 AM

## 2022-06-28 NOTE — Anesthesia Preprocedure Evaluation (Signed)
Anesthesia Evaluation  Patient identified by MRN, date of birth, ID band Patient awake    Reviewed: Allergy & Precautions, NPO status , Patient's Chart, lab work & pertinent test results  History of Anesthesia Complications Negative for: history of anesthetic complications  Airway Mallampati: III  TM Distance: >3 FB Neck ROM: full    Dental  (+) Chipped   Pulmonary neg pulmonary ROS,    Pulmonary exam normal        Cardiovascular Exercise Tolerance: Good (-) hypertensionnegative cardio ROS Normal cardiovascular exam     Neuro/Psych    GI/Hepatic negative GI ROS,   Endo/Other  Morbid obesity  Renal/GU   negative genitourinary   Musculoskeletal   Abdominal   Peds  Hematology negative hematology ROS (+)   Anesthesia Other Findings Past Medical History: No date: Anemia No date: Depression     Comment:  is past hx but not active now per pt 01/30/2019: Hemoglobin C trait ([place]) 03/28/2022: HGSIL on Pap smear of cervix     Comment:  HGSIL pap during pregnancy- needs colpo per Evans and               Cherry antpartally No date: Morbidly obese ([place]) 08/26/2019: Post partum depression  Past Surgical History: 06/15/2017: COLONOSCOPY WITH PROPOFOL; N/A     Comment:  Procedure: COLONOSCOPY WITH PROPOFOL;  Surgeon: [name], MD;  [facility];  Service:               Gastroenterology;  Laterality: N/A; 06/15/2017: ESOPHAGOGASTRODUODENOSCOPY (EGD) WITH PROPOFOL; N/A     Comment:  Procedure: ESOPHAGOGASTRODUODENOSCOPY (EGD) WITH               PROPOFOL;  Surgeon: [name], MD;  Location: [facility]               ENDOSCOPY;  Service: Gastroenterology;  Laterality: N/A; No date: MASTECTOMY No date: TONSILLECTOMY  BMI    Body Mass Index: 47.46 kg/m      Reproductive/Obstetrics (+) Pregnancy                             Anesthesia Physical Anesthesia Plan  ASA:  3  Anesthesia Plan: Epidural   Post-op Pain Management:    Induction:   PONV Risk Score and Plan:   Airway Management Planned: Natural Airway  Additional Equipment:   Intra-op Plan:   Post-operative Plan:   Informed Consent: I have reviewed the patients History and Physical, chart, labs and discussed the procedure including the risks, benefits and alternatives for the proposed anesthesia with the patient or authorized representative who has indicated his/her understanding and acceptance.     Dental Advisory Given  Plan Discussed with: Anesthesiologist  Anesthesia Plan Comments: (Patient reports no bleeding problems and no anticoagulant use.   Patient consented for risks of anesthesia including but not limited to:  - adverse reactions to medications - risk of bleeding, infection and or nerve damage from epidural that could lead to paralysis - risk of headache or failed epidural - nerve damage due to positioning - that if epidural is used for C-section that there is a chance of epidural failure requiring spinal placement or conversion to GA - Damage to heart, brain, lungs, other parts of body or loss of life  Patient voiced understanding.)        Anesthesia Quick Evaluation

## 2022-06-28 NOTE — Progress Notes (Signed)
  Labor Progress Note   26 y.o. E4V4098 @ [redacted]w[redacted]d, admitted for  Pregnancy, Labor Management. IOL  Subjective:  Comfortable with epidural. Feeling some low pelvic "pressure"  Objective:  BP 124/63   Pulse 69   Temp 98.3 F (36.8 C) (Oral)   Resp 18   Ht 5' (1.524 m)   Wt 110.2 kg   LMP 10/03/2021 (Exact Date)   SpO2 100%   BMI 47.46 kg/m  Abd: gravid, ND, FHT present, mild tenderness on exam Extr: trace to 1+ bilateral pedal edema SVE: CERVIX: 3.5 cm dilated, 70 effaced, -2 station Exam by RN at time of foley placement  Fluid bolus given for minimal variability earlier  EFM: FHR: 125 bpm, variability: moderate,  accelerations: 10x10,  decelerations:  Present  early Toco: every 2-3 minutes Labs: I have reviewed the patient's lab results.   Assessment & Plan:  [id number]@ 373w0dadmitted for  Pregnancy and Labor/Delivery Management  1. Pain management: epidural. 2. FWB: FHT category Category II.  3. ID: GBS negative 4. Labor management: continue pitocin titration, consider AROM with Category I tracing  All discussed with patient, see orders   JaRod CanCNFairchild AFBroup 06/28/2022  10:34 PM

## 2022-06-28 NOTE — Anesthesia Procedure Notes (Signed)
Epidural Patient location during procedure: OB Start time: 06/28/2022 7:40 PM End time: 06/28/2022 7:43 PM  Staffing Anesthesiologist: Courtnei Ruddell, Precious Haws, MD Performed: anesthesiologist   Preanesthetic Checklist Completed: patient identified, IV checked, site marked, risks and benefits discussed, surgical consent, monitors and equipment checked, pre-op evaluation and timeout performed  Epidural Patient position: sitting Prep: ChloraPrep Patient monitoring: heart rate, continuous pulse ox and blood pressure Approach: midline Location: L3-L4 Injection technique: LOR saline  Needle:  Needle type: Tuohy  Needle gauge: 17 G Needle length: 9 cm and 9 Needle insertion depth: 7 cm Catheter type: closed end flexible Catheter size: 19 Gauge Catheter at skin depth: 12 cm Test dose: negative and 1.5% lidocaine with Epi 1:200 K  Assessment Sensory level: T10 Events: blood not aspirated, injection not painful, no injection resistance, no paresthesia and negative IV test  Additional Notes 1 attempt Pt. Evaluated and documentation done after procedure finished. Patient identified. Risks/Benefits/Options discussed with patient including but not limited to bleeding, infection, nerve damage, paralysis, failed block, incomplete pain control, headache, blood pressure changes, nausea, vomiting, reactions to medication both or allergic, itching and postpartum back pain. Confirmed with bedside nurse the patient's most recent platelet count. Confirmed with patient that they are not currently taking any anticoagulation, have any bleeding history or any family history of bleeding disorders. Patient expressed understanding and wished to proceed. All questions were answered. Sterile technique was used throughout the entire procedure. Please see nursing notes for vital signs. Test dose was given through epidural catheter and negative prior to continuing to dose epidural or start infusion. Warning signs of  high block given to the patient including shortness of breath, tingling/numbness in hands, complete motor block, or any concerning symptoms with instructions to call for help. Patient was given instructions on fall risk and not to get out of bed. All questions and concerns addressed with instructions to call with any issues or inadequate analgesia.    Patient tolerated the insertion well without immediate complications.Reason for block:procedure for pain

## 2022-06-29 ENCOUNTER — Encounter: Payer: Self-pay | Admitting: Obstetrics and Gynecology

## 2022-06-29 DIAGNOSIS — O99214 Obesity complicating childbirth: Secondary | ICD-10-CM

## 2022-06-29 DIAGNOSIS — D509 Iron deficiency anemia, unspecified: Secondary | ICD-10-CM

## 2022-06-29 DIAGNOSIS — Z3A39 39 weeks gestation of pregnancy: Secondary | ICD-10-CM

## 2022-06-29 DIAGNOSIS — O9902 Anemia complicating childbirth: Secondary | ICD-10-CM

## 2022-06-29 DIAGNOSIS — O09293 Supervision of pregnancy with other poor reproductive or obstetric history, third trimester: Secondary | ICD-10-CM

## 2022-06-29 LAB — CBC
HCT: 31.7 % — ABNORMAL LOW (ref 36.0–46.0)
Hemoglobin: 10.2 g/dL — ABNORMAL LOW (ref 12.0–15.0)
MCH: 20.6 pg — ABNORMAL LOW (ref 26.0–34.0)
MCHC: 32.2 g/dL (ref 30.0–36.0)
MCV: 64.2 fL — ABNORMAL LOW (ref 80.0–100.0)
Platelets: 271 10*3/uL (ref 150–400)
RBC: 4.94 MIL/uL (ref 3.87–5.11)
RDW: 18 % — ABNORMAL HIGH (ref 11.5–15.5)
WBC: 11.6 10*3/uL — ABNORMAL HIGH (ref 4.0–10.5)
nRBC: 0 % (ref 0.0–0.2)

## 2022-06-29 LAB — RPR: RPR Ser Ql: NONREACTIVE

## 2022-06-29 MED ORDER — SIMETHICONE 80 MG PO CHEW: 80.0000 mg | CHEWABLE_TABLET | ORAL | Status: DC | PRN

## 2022-06-29 MED ORDER — ONDANSETRON HCL 4 MG/2ML IJ SOLN: 4.0000 mg | INTRAMUSCULAR | Status: DC | PRN

## 2022-06-29 MED ORDER — COCONUT OIL OIL
1.0000 | TOPICAL_OIL | Status: DC | PRN
  Filled 2022-06-29: qty 7.5

## 2022-06-29 MED ORDER — ONDANSETRON HCL 4 MG PO TABS: 4.0000 mg | ORAL_TABLET | ORAL | Status: DC | PRN

## 2022-06-29 MED ORDER — BENZOCAINE-MENTHOL 20-0.5 % EX AERO
1.0000 | INHALATION_SPRAY | CUTANEOUS | Status: DC | PRN
  Filled 2022-06-29: qty 56

## 2022-06-29 MED ORDER — ACETAMINOPHEN 325 MG PO TABS
650.0000 mg | ORAL_TABLET | ORAL | Status: DC | PRN
  Administered 2022-06-29: 650 mg via ORAL
  Filled 2022-06-29: qty 2

## 2022-06-29 MED ORDER — PRENATAL MULTIVITAMIN CH
1.0000 | ORAL_TABLET | Freq: Every day | ORAL | Status: DC
  Administered 2022-06-29 – 2022-06-30 (×2): 1 via ORAL
  Filled 2022-06-29 (×2): qty 1

## 2022-06-29 MED ORDER — DIPHENHYDRAMINE HCL 25 MG PO CAPS: 25.0000 mg | ORAL_CAPSULE | Freq: Four times a day (QID) | ORAL | Status: DC | PRN

## 2022-06-29 MED ORDER — IBUPROFEN 600 MG PO TABS
600.0000 mg | ORAL_TABLET | Freq: Four times a day (QID) | ORAL | Status: DC
  Administered 2022-06-29 – 2022-06-30 (×6): 600 mg via ORAL
  Filled 2022-06-29 (×6): qty 1

## 2022-06-29 MED ORDER — SENNOSIDES-DOCUSATE SODIUM 8.6-50 MG PO TABS
2.0000 | ORAL_TABLET | Freq: Every day | ORAL | Status: DC
  Filled 2022-06-29: qty 2

## 2022-06-29 MED ORDER — BISACODYL 10 MG RE SUPP
10.0000 mg | Freq: Once | RECTAL | Status: AC
  Administered 2022-06-29: 10 mg via RECTAL
  Filled 2022-06-29: qty 1

## 2022-06-29 MED ORDER — WITCH HAZEL-GLYCERIN EX PADS
1.0000 | MEDICATED_PAD | CUTANEOUS | Status: DC | PRN
  Filled 2022-06-29: qty 100

## 2022-06-29 MED ORDER — DIBUCAINE (PERIANAL) 1 % EX OINT: 1.0000 | TOPICAL_OINTMENT | CUTANEOUS | Status: DC | PRN

## 2022-06-29 NOTE — Progress Notes (Signed)
Progress Note - Vaginal Delivery  Cindy [name] is a 26 y.o. G3P3003 now PP day 1 s/p Vaginal, Spontaneous .   Subjective:  The patient reports no complaints, up ad lib, voiding, and + flatus   Objective:  Vital signs in last 24 hours: Temp:  [97.6 F (36.4 C)-98.4 F (36.9 C)] 97.6 F (36.4 C) (09/14 0827) Pulse Rate:  [51-102] 57 (09/14 0827) Resp:  [15-18] 18 (09/14 0827) BP: (88-140)/(45-95) 102/61 (09/14 0827) SpO2:  [97 %-100 %] 97 % (09/14 0827)  Physical Exam:  General: alert, cooperative, appears stated age, and no distress Lochia: appropriate Uterine Fundus: firm    Data Review Recent Labs    06/28/22 0942 06/29/22 0540  HGB 11.1* 10.2*  HCT 33.5* 31.7*    Assessment/Plan: Principal Problem:   Encounter for induction of labor Active Problems:   Postpartum care following vaginal delivery   [redacted] weeks gestation of pregnancy   Encounter for care or examination of lactating mother   Plan for discharge tomorrow   -- Continue routine PP care.     Philip Aspen, CNM  06/29/2022 9:49 AM

## 2022-06-29 NOTE — Lactation Note (Signed)
This note was copied from a baby's chart. Lactation Consultation Note  Patient Name: [name] [id number] Date: 06/29/2022 Reason for consult: Initial assessment;Term Age:24 hours  Initial lactation visit.  Maternal Data Has patient been taught Hand Expression?: Yes Does the patient have breastfeeding experience prior to this delivery?: Yes How long did the patient breastfeed?: 1-2 weeks  Mom is a p3 that has had difficulties with  milk supply. Mom has large breasts with everted nipples, does great with positioning, sandwiching of tissue and rubbing nose to nipple.  Feeding Mother's Current Feeding Choice: Breast Milk and Formula  Baby has had several feedings since delivery. Due to hx, mom is worried about baby not getting enough. She has asked for bottle for supplement. LC at bedside to observe attempted feeding at the breast and assist as needed.  LATCH Score Latch: Repeated attempts needed to sustain latch, nipple held in mouth throughout feeding, stimulation needed to elicit sucking reflex.  Audible Swallowing: None  Type of Nipple: Everted at rest and after stimulation  Comfort (Breast/Nipple): Soft / non-tender  Hold (Positioning): No assistance needed to correctly position infant at breast.  LATCH Score: 7  Baby was calm, brought to breast in football hold on the L side. Baby alert, attempts made to latch were not successful, baby remained calm, kept mouth closed, and did not attempt to latch at all.  Lactation Tools Discussed/Used    Interventions Interventions: Breast feeding basics reviewed;Assisted with latch;Hand express;Adjust position;Support pillows;DEBP;Education  We reviewed first 24 hour feeding behaviors and patterns, early hunger cues/signs she is ready to eat. Provided reassurance that baby is doing well, but validated mom's feelings due to mom's history.  We discussed how to implement formula while still working on breastfeeding and protecting  the supply.  -BF first -Follow-up with formula as needed with appropriate volumes (reviewed infants stomach size) -Educated parents on pace-bottle feeding to help slow the flow of bottle nipple and protect breastfeeding.  Discharge Pump: Personal  Consult Status Consult Status: Follow-up  Whiteboard updated with LC name/number.  Lavonia Drafts 06/29/2022, 12:47 PM

## 2022-06-29 NOTE — Anesthesia Postprocedure Evaluation (Signed)
Anesthesia Post Note  Patient: Cindy [name]  Procedure(s) Performed: AN AD HOC LABOR EPIDURAL  Patient location during evaluation: Mother Baby Anesthesia Type: Epidural Level of consciousness: awake and alert Pain management: pain level controlled Vital Signs Assessment: post-procedure vital signs reviewed and stable Respiratory status: spontaneous breathing, nonlabored ventilation and respiratory function stable Cardiovascular status: stable Postop Assessment: no headache, no backache and epidural receding Anesthetic complications: no   No notable events documented.   Last Vitals:  Vitals:   06/29/22 0132 06/29/22 0500  BP: (!) 122/59 118/75  Pulse:  (!) 51  Resp:  18  Temp:  36.6 C  SpO2:  100%    Last Pain:  Vitals:   06/29/22 0724  TempSrc:   PainSc: 0-No pain                 Cleophus Mendonsa Lorenza Chick

## 2022-06-29 NOTE — Discharge Summary (Signed)
OB Discharge Summary     Patient Name: Cindy [name] DOB: [birth year] MRN: [id number]  Date of admission: 06/28/2022 Delivering provider: Rod Can, CNM  Date of Delivery: 06/28/2022  Date of discharge: 06/30/2022  Admitting diagnosis: Encounter for induction of labor [Z34.90] Intrauterine pregnancy: [redacted]w[redacted]d    Secondary diagnosis: None     Discharge diagnosis: Term Pregnancy Delivered                                                                                                Post partum procedures: NA  Augmentation: Pitocin and Cytotec  Complications: None  Hospital course:  Induction of Labor With Vaginal Delivery   26y.o. yo G3P2002 at 342w1das admitted to the hospital 06/28/2022 for induction of labor.  Indication for induction:  elevated BMI .  Patient had an uncomplicated labor course as follows: Membrane Rupture Time/Date: 11:18 PM ,06/28/2022   Delivery Method:Vaginal, Spontaneous  Episiotomy: None  Lacerations:  None  Details of delivery can be found in separate delivery note.    Patient had a routine postpartum course. She is tolerating regular diet, her pain is controlled with PO medication, she is ambulating and voiding without difficulty. Breastfeeding independently, also giving formula. Has good support at home. Ready to go home.   Patient is discharged home 06/30/22.  Newborn Data: Birth date:06/28/2022  Birth time:11:57 PM  Gender:Female   Samir Living status:Living  Apgars:8 ,9  Weight:3160 g , 6 pounds 15.5 ounces  Physical exam  Vitals:   06/29/22 0827 06/29/22 1200 06/29/22 1635 06/29/22 2336  BP: 102/61 100/63 (!) 117/50 123/73  Pulse: (!) 57 (!) 49 (!) 52 (!) 57  Resp: 18   20  Temp: 97.6 F (36.4 C) 97.9 F (36.6 C) 98.1 F (36.7 C) 97.6 F (36.4 C)  TempSrc: Oral Oral Oral   SpO2: 97% 100% 100% 100%  Weight:      Height:       General: alert Breasts: soft, no discoloration or masses, nipples flat and intact bilaterally  Lochia:  appropriate Uterine Fundus: firm Incision: N/A Perineum: slightly swollen  DVT Evaluation: No evidence of DVT seen on physical exam. Negative Homan's sign. Extremities: trace BLE  Labs: Lab Results  Component Value Date   WBC 11.6 (H) 06/29/2022   HGB 10.2 (L) 06/29/2022   HCT 31.7 (L) 06/29/2022   MCV 64.2 (L) 06/29/2022   PLT 271 06/29/2022    Discharge instruction: per After Visit Summary.  Medications:  Allergies as of 06/30/2022   No Known Allergies      Medication List     STOP taking these medications    aspirin EC 81 MG tablet   cyclobenzaprine 10 MG tablet Commonly known as: FLEXERIL   terconazole 0.4 % vaginal cream Commonly known as: TERAZOL 7       TAKE these medications    acetaminophen 325 MG tablet Commonly known as: Tylenol Take 2 tablets (650 mg total) by mouth every 4 (four) hours as needed (for pain scale < 4). What changed:  medication strength how much to take when to take this  reasons to take this   coconut oil Oil Apply 1 Application topically as needed.   ibuprofen 600 MG tablet Commonly known as: ADVIL Take 1 tablet (600 mg total) by mouth every 6 (six) hours as needed.   prenatal multivitamin Tabs tablet Take 1 tablet by mouth daily at 12 noon.   simethicone 80 MG chewable tablet Commonly known as: MYLICON Chew 1 tablet (80 mg total) by mouth as needed for flatulence.        Diet: routine diet  Activity: Advance as tolerated. Pelvic rest for 6 weeks.   Outpatient follow up:  Follow-up Information     Rod Can, CNM. Schedule an appointment as soon as possible for a visit in 6 week(s).   Specialty: Obstetrics Why: postpartum follow up/also need MD appointment for colposcopy Contact information: [address] [place] [address] [phone]                   Postpartum contraception: Depo Provera initially Rhogam Given postpartum: Rh positive Rubella vaccine given postpartum:  immune Varicella vaccine given postpartum: immune TDaP given antepartum or postpartum: given antepartum    Newborn Delivery   Birth date/time: 06/28/2022 23:57:00 Delivery type: Vaginal, Spontaneous       Baby Feeding:  Breast and Formula  Disposition:home with mother  SIGNEDJillene Bucks [facility], CNM 06/30/2022 8:59 AM

## 2022-06-30 MED ORDER — ACETAMINOPHEN 325 MG PO TABS: 650.0000 mg | ORAL_TABLET | ORAL | Status: DC | PRN

## 2022-06-30 MED ORDER — SIMETHICONE 80 MG PO CHEW: 80.0000 mg | CHEWABLE_TABLET | ORAL | 0 refills | Status: DC | PRN

## 2022-06-30 MED ORDER — INFLUENZA VAC SPLIT QUAD 0.5 ML IM SUSY
0.5000 mL | PREFILLED_SYRINGE | INTRAMUSCULAR | Status: AC
  Administered 2022-06-30: 0.5 mL via INTRAMUSCULAR
  Filled 2022-06-30: qty 0.5

## 2022-06-30 MED ORDER — MEDROXYPROGESTERONE ACETATE 150 MG/ML IM SUSP
150.0000 mg | Freq: Once | INTRAMUSCULAR | Status: AC
  Administered 2022-06-30: 150 mg via INTRAMUSCULAR
  Filled 2022-06-30: qty 1

## 2022-06-30 MED ORDER — COCONUT OIL OIL: 1.0000 | TOPICAL_OIL | 0 refills | Status: DC | PRN

## 2022-06-30 MED ORDER — IBUPROFEN 600 MG PO TABS: 600.0000 mg | ORAL_TABLET | Freq: Four times a day (QID) | ORAL | 0 refills | Status: DC | PRN

## 2022-06-30 NOTE — Progress Notes (Addendum)
Patient discharged home with family.  Discharge instructions, when to follow up, and prescriptions reviewed with patient.  Patient verbalized understanding. Patient received Depo shot and Flu vaccine prior to discharge. Patient refused wheelchair ride out.

## 2022-07-20 ENCOUNTER — Encounter: Payer: Self-pay | Admitting: Obstetrics

## 2022-08-02 ENCOUNTER — Telehealth: Payer: Self-pay

## 2022-08-02 NOTE — Telephone Encounter (Signed)
Patient had contacted office stating that she delivered her child 5 weeks ago and reports concerns of heavy bleeding and cramping. Attempted to contact patient to further triage and left message to contact office back, if patient is having heavy bleeding and pain it is recommended an office visit for evaluation. Left message for patient to call back. KW

## 2022-08-09 ENCOUNTER — Ambulatory Visit: Payer: Medicaid Other | Admitting: Advanced Practice Midwife

## 2022-08-14 ENCOUNTER — Ambulatory Visit: Payer: Medicaid Other | Admitting: Advanced Practice Midwife

## 2022-08-14 ENCOUNTER — Telehealth: Payer: Self-pay | Admitting: Advanced Practice Midwife

## 2022-08-14 NOTE — Telephone Encounter (Signed)
Reached out to pt to reschedule 10/30 pp appt with JEG at 2:35.  Left message for pt to call back to reschedule.

## 2022-08-15 NOTE — Telephone Encounter (Signed)
Contacted pt on 10/31 to reschedule 10/30 pp appt with JEG.  Was able to reschedule on 11/3 with JEG at 1:55

## 2022-08-18 ENCOUNTER — Ambulatory Visit (INDEPENDENT_AMBULATORY_CARE_PROVIDER_SITE_OTHER): Payer: Medicaid Other | Admitting: Advanced Practice Midwife

## 2022-08-18 ENCOUNTER — Encounter: Payer: Self-pay | Admitting: Advanced Practice Midwife

## 2022-08-18 DIAGNOSIS — Z3042 Encounter for surveillance of injectable contraceptive: Secondary | ICD-10-CM | POA: Diagnosis not present

## 2022-08-18 LAB — POCT HEMOGLOBIN: Hemoglobin: 10.7 g/dL — AB (ref 11–14.6)

## 2022-08-18 MED ORDER — MEDROXYPROGESTERONE ACETATE 150 MG/ML IM SUSP
150.0000 mg | Freq: Once | INTRAMUSCULAR | Status: AC
  Administered 2022-08-18: 150 mg via INTRAMUSCULAR

## 2022-08-18 NOTE — Progress Notes (Signed)
Last Depo-Provera: 06/30/2022 Side Effects if any: None Serum HCG indicated?  Depo-Provera 150 mg IM given by: Otelia Limes, CMA . Next appointment due 11/03/2022-11/17/2022.   [place]: [phone]

## 2022-08-18 NOTE — Progress Notes (Signed)
Postpartum Visit  Chief Complaint:  Chief Complaint  Patient presents with   Postpartum Care    History of Present Illness: Patient is a 26 y.o. Q6P6195 presents for postpartum visit.  Review the Delivery Report for details.   Date of delivery: 06/28/2022 Type of delivery: Vaginal delivery - Vacuum or forceps assisted  no Episiotomy No.  Laceration: no  Pregnancy or labor problems:  no Any problems since the delivery:  she mentions muscle aches in cold weather that she thinks is due to anemia. Fingerstick hgb today. She is encouraged to continue taking PNV with Fe.  Newborn Details:  SINGLETON :  83. BabyGender female. Birth weight: 6# 15.5 oz Maternal Details:  Breast or formula feeding: breast fed for 2 weeks, now formula feeding Intercourse: No  Contraception after delivery:  Depo Any bowel or bladder issues: No  Post partum depression/anxiety noted:  no Edinburgh Post-Partum Depression Score: 0 Date of last PAP: 2023  no abnormalities   Review of Systems: Review of Systems  Constitutional:  Negative for chills and fever.  HENT:  Negative for congestion, ear discharge, ear pain, hearing loss, sinus pain and sore throat.   Eyes:  Negative for blurred vision and double vision.  Respiratory:  Negative for cough, shortness of breath and wheezing.   Cardiovascular:  Negative for chest pain, palpitations and leg swelling.  Gastrointestinal:  Negative for abdominal pain, blood in stool, constipation, diarrhea, heartburn, melena, nausea and vomiting.  Genitourinary:  Negative for dysuria, flank pain, frequency, hematuria and urgency.  Musculoskeletal:  Positive for myalgias. Negative for back pain and joint pain.  Skin:  Negative for itching and rash.  Neurological:  Negative for dizziness, tingling, tremors, sensory change, speech change, focal weakness, seizures, loss of consciousness, weakness and headaches.  Endo/Heme/Allergies:  Negative for environmental allergies.  Does not bruise/bleed easily.  Psychiatric/Behavioral:  Negative for depression, hallucinations, memory loss, substance abuse and suicidal ideas. The patient is not nervous/anxious and does not have insomnia.     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   HGSIL pap during pregnancy- needs colpo per [place] antpartally   Morbidly obese ([place])    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    Past Surgical History:  Past Surgical History:  Procedure Laterality Date   COLONOSCOPY WITH PROPOFOL N/A 06/15/2017   Procedure: COLONOSCOPY WITH PROPOFOL;  Surgeon: [name], MD;  [facility] END[facility]COPY;  Service: Gastroenterology;  Laterality: N/A;   ESOPHAGOGASTRODUODENOSCOPY (EGD) WITH PROPOFOL N/A 06/15/2017   Procedure: ESOPHAGOGASTRODUODENOSCOPY (EGD) WITH PROPOFOL;  Surgeon: [name], MD;  Location: [facility] ENDOSCOPY;  Service: Gastroenterology;  Laterality: N/A;   MASTECTOMY     TONSILLECTOMY      Family History:  Family History  Problem Relation Age of Onset   Thyroid disease Mother    Hypertension Mother    Sickle cell trait Mother    Sickle cell anemia Sister    Hypertension Maternal Grandmother    Breast cancer Maternal Grandmother     Social History:  Social History   Socioeconomic History   Marital status: Significant Other    Spouse name: Building surveyor   Number of children: 1   Years of education: Not on file   Highest education level: 9th grade  Occupational History  Not on file  Tobacco Use   Smoking status: Never   Smokeless tobacco: Never  Vaping Use   Vaping Use: Never used  Substance and Sexual Activity   Alcohol use: No    Comment: former   Drug use: Not Currently    Types: Marijuana    Comment: last used in sept   Sexual activity: Yes    Partners: Male    Birth  control/protection: Injection    Comment: depo  Other Topics Concern   Not on file  Social History Narrative   Not on file   Social Determinants of Health   Financial Resource Strain: Not on file  Food Insecurity: No Food Insecurity (06/28/2022)   Hunger Vital Sign    Worried About Running Out of Food in the Last Year: Never true    Ran Out of Food in the Last Year: Never true  Transportation Needs: No Transportation Needs (06/28/2022)   PRAPARE - Hydrologist (Medical): No    Lack of Transportation (Non-Medical): No  Physical Activity: Not on file  Stress: Not on file  Social Connections: Not on file  Intimate Partner Violence: Not on file    Allergies:  No Known Allergies  Medications: Prior to Admission medications   Medication Sig Start Date End Date Taking? Authorizing Provider  acetaminophen (TYLENOL) 325 MG tablet Take 2 tablets (650 mg total) by mouth every 4 (four) hours as needed (for pain scale < 4). 06/30/22  Yes Dominic, Nunzio Cobbs, CNM  coconut oil OIL Apply 1 Application topically as needed. 06/30/22  Yes Dominic, Nunzio Cobbs, CNM  ibuprofen (ADVIL) 600 MG tablet Take 1 tablet (600 mg total) by mouth every 6 (six) hours as needed. 06/30/22  Yes Dominic, Nunzio Cobbs, CNM  Prenatal Vit-Fe Fumarate-FA (PRENATAL MULTIVITAMIN) TABS tablet Take 1 tablet by mouth daily at 12 noon.   Yes [provider]  simethicone (MYLICON) 80 MG chewable tablet Chew 1 tablet (80 mg total) by mouth as needed for flatulence. 06/30/22  Yes Dominic, Nunzio Cobbs, CNM    Physical Exam Blood pressure 128/80, height 5' (1.524 m), weight 233 lb (105.7 kg), last menstrual period 08/16/2022, not currently breastfeeding.    General: NAD HEENT: normocephalic, anicteric Pulmonary: No increased work of breathing Abdomen: NABS, soft, non-tender, non-distended.  Umbilicus without lesions.  No hepatomegaly, splenomegaly or masses palpable. No evidence of  hernia. Genitourinary:  External: Normal external female genitalia.  Normal urethral meatus, normal Bartholin's and Skene's glands.    Vagina: Normal vaginal mucosa, no evidence of prolapse.    Cervix: deferred PAP interval  Uterus: Non-enlarged, mobile, normal contour.  No CMT Extremities: no edema, erythema, or tenderness Neurologic: Grossly intact Psychiatric: mood appropriate, affect full   Edinburgh Postnatal Depression Scale - 08/18/22 1355       Edinburgh Postnatal Depression Scale:  In the Past 7 Days   I have been able to laugh and see the funny side of things. 0    I have looked forward with enjoyment to things. 0    I have blamed myself unnecessarily when things went wrong. 0    I have been anxious or worried for no good reason. 0    I have felt scared or panicky for no good reason. 0    Things have been getting on top of me. 0    I have been so unhappy that I have had difficulty sleeping. 0    I have felt sad or  miserable. 0    I have been so unhappy that I have been crying. 0    The thought of harming myself has occurred to me. 0    Edinburgh Postnatal Depression Scale Total 0            POCT hgb: 10.7   Assessment: 26 y.o. Q6V7846 presenting for 6 week postpartum visit  Plan: Problem List Items Addressed This Visit   None Visit Diagnoses     Encounter for Depo-Provera contraception    -  Primary   Relevant Medications   medroxyPROGESTERone (DEPO-PROVERA) injection 150 mg   6 weeks postpartum follow-up       Relevant Orders   POCT hemoglobin        1) Contraception - Education given regarding options for contraception, as well as compatibility with breast feeding if applicable.  Patient plans on Depo-Provera injections for contraception. She received a dose of Depo at time of discharge from delivery. She also received a dose today inadvertently. Safety zone submitted. Patient is aware.   2)  Pap - ASCCP guidelines and rationale discussed.   Patient  opts for every 3 years screening interval  3) Patient underwent screening for postpartum depression with no signs of depression  4) Return in about 1 year (around 08/19/2023) for annual established gyn.   Rod Can, [facility] 08/18/2022, 1:56 PM

## 2022-09-18 IMAGING — US US ABDOMEN LIMITED
1 series · 14 of 25 positions shown · non-contrast
Comparison: None.

CLINICAL DATA: Vaginal bleeding and nausea.

EXAM:
ULTRASOUND ABDOMEN LIMITED RIGHT UPPER QUADRANT

[Series 1: us abdomen limited ruq (liver/gb) · 14 of 47 slices shown]
[im 1/47]
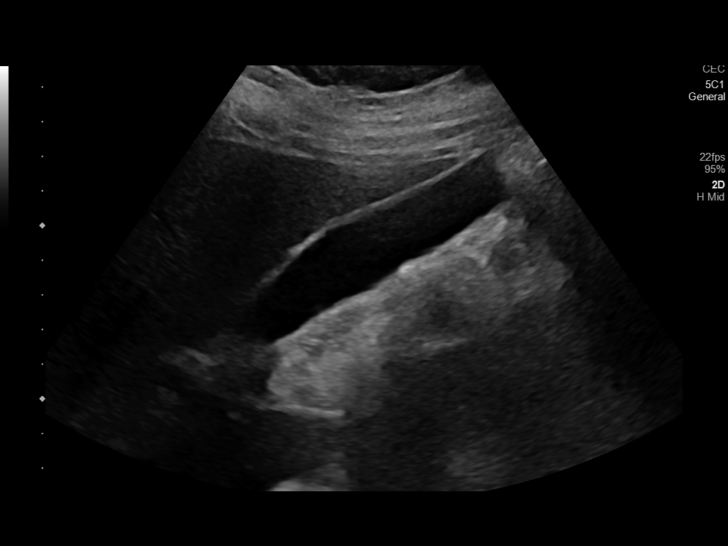
[im 4/47]
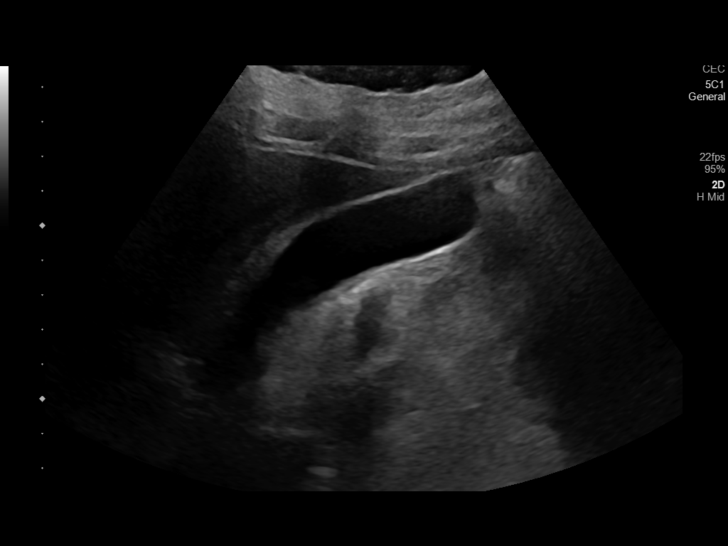
[im 8/47]
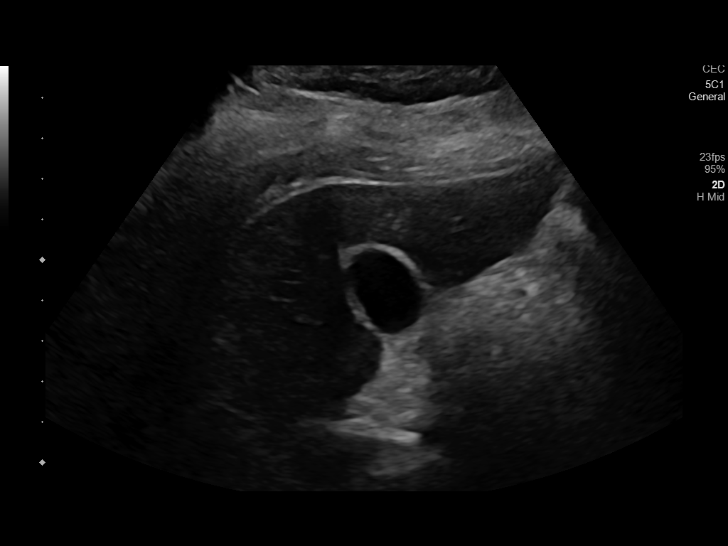
[im 12/47]
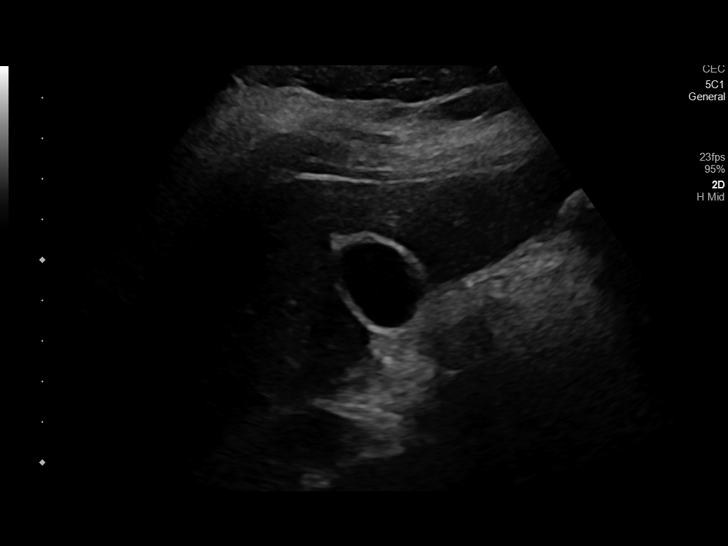
[im 16/47]
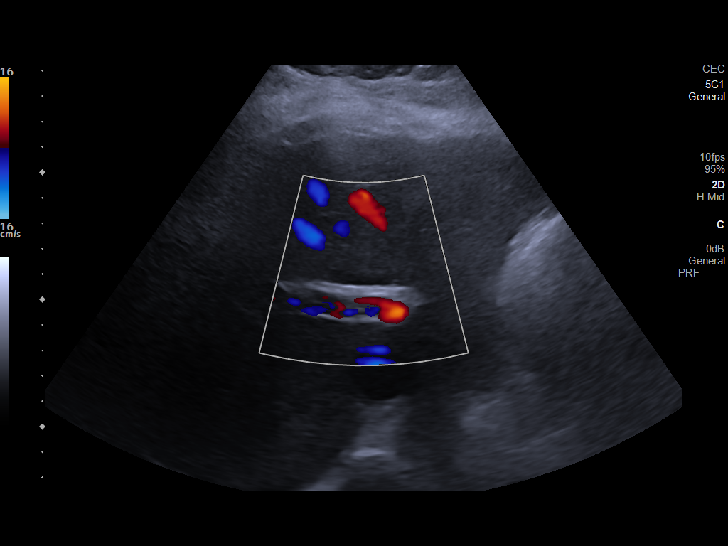
[im 18/47]
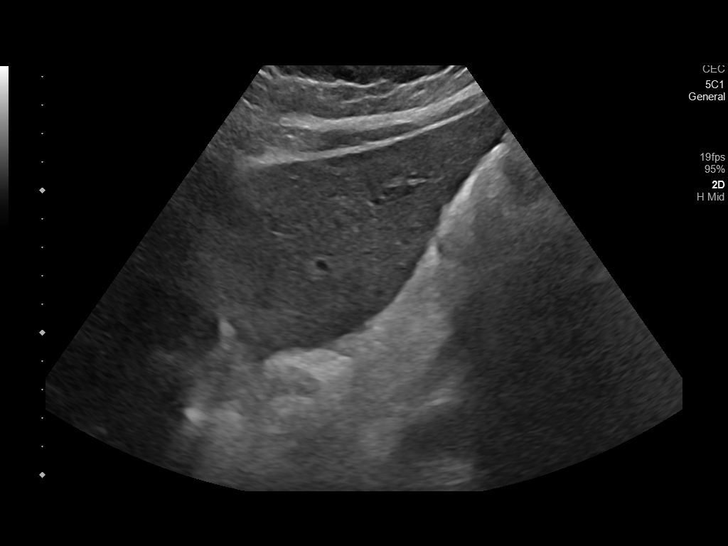
[im 22/47]
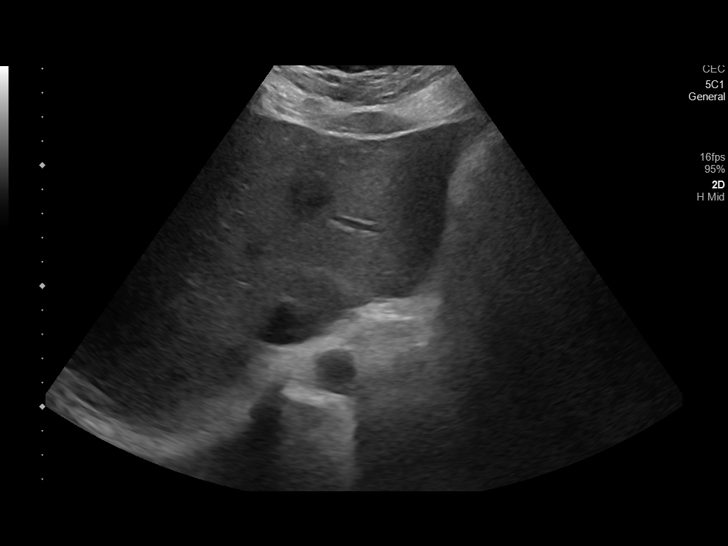
[im 25/47]
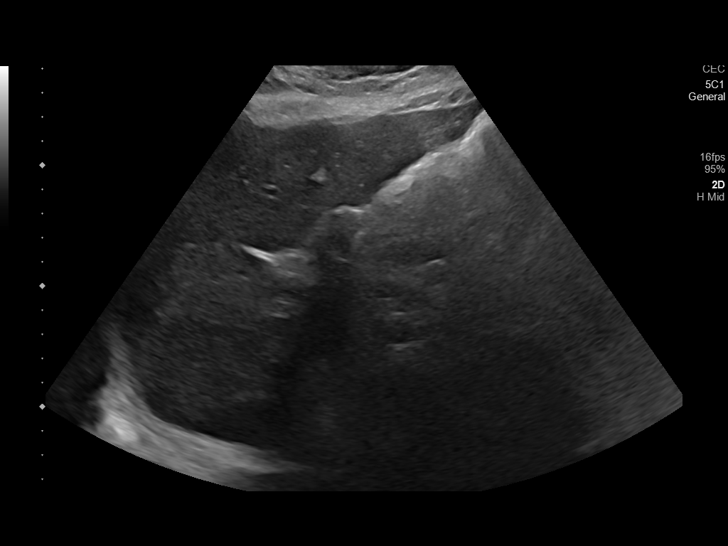
[im 29/47]
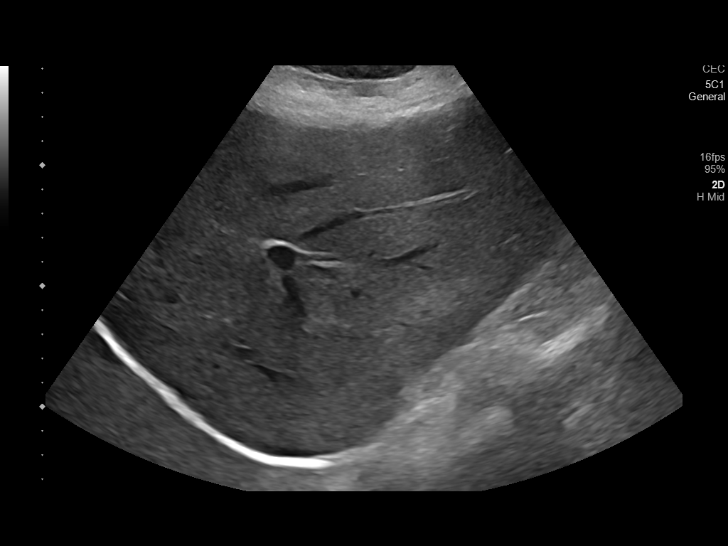
[im 31/47]
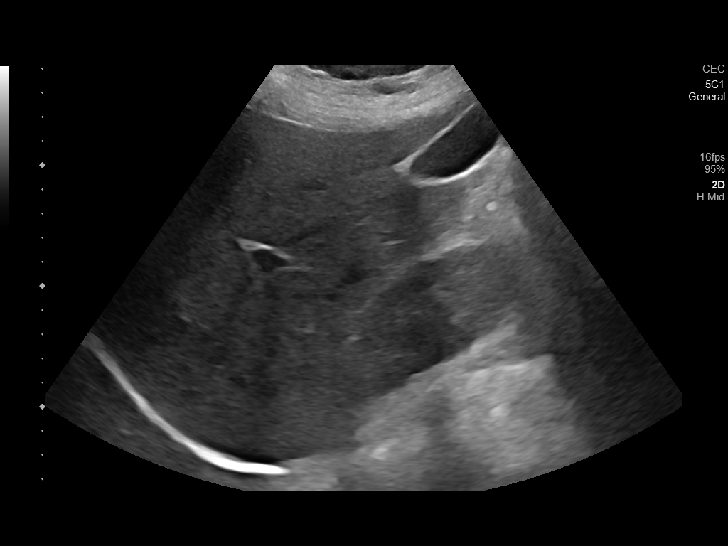
[im 35/47]
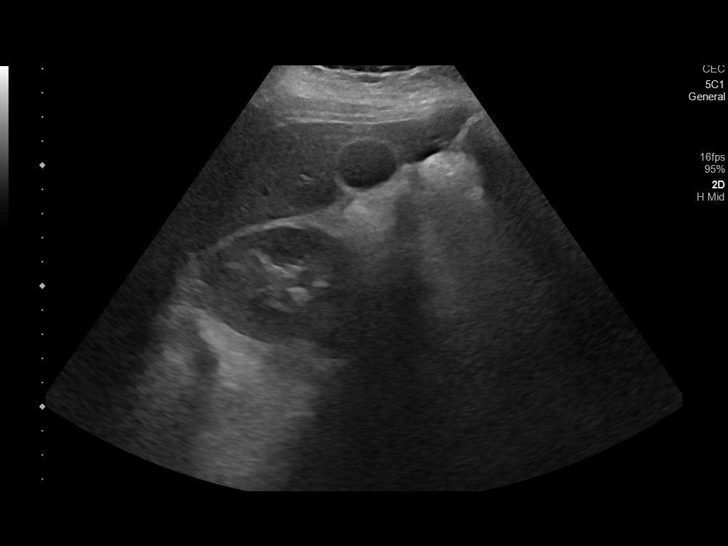
[im 39/47]
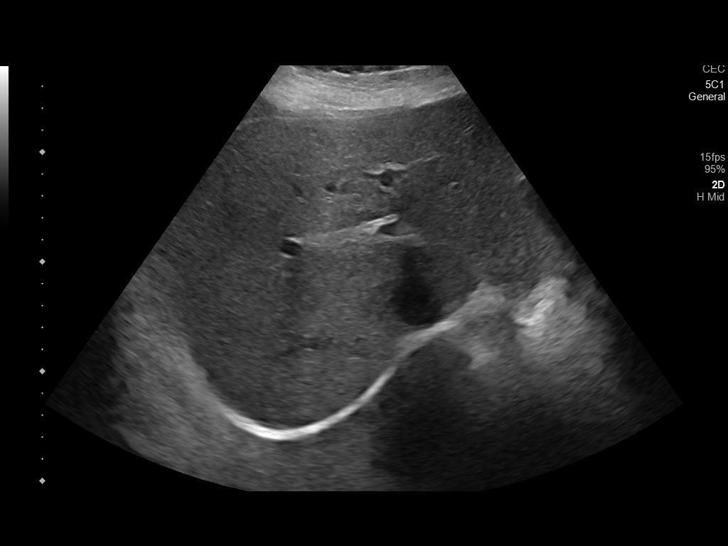
[im 43/47]
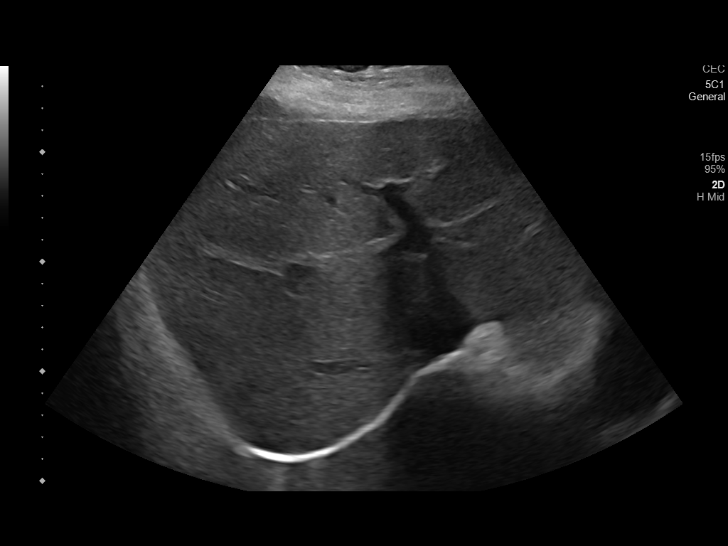
[im 47/47]
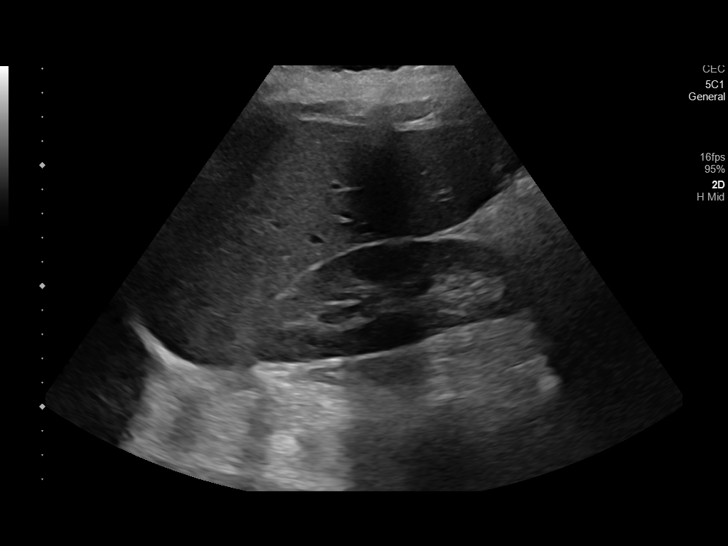

[14 of 25 positions shown; findings below may reference images not displayed]

FINDINGS: Gallbladder:

No gallstones or wall thickening visualized (2.0 mm). No sonographic
Murphy sign noted by sonographer.

Common bile duct:

Diameter: 3.4 mm

Liver:

No focal lesion identified. Within normal limits in parenchymal
echogenicity. Portal vein is patent on color Doppler imaging with
normal direction of blood flow towards the liver.

Other: None.
IMPRESSION: Normal right upper quadrant ultrasound.

## 2022-11-03 ENCOUNTER — Ambulatory Visit (INDEPENDENT_AMBULATORY_CARE_PROVIDER_SITE_OTHER): Payer: Medicaid Other

## 2022-11-03 VITALS — BP 130/80 | Ht 65.0 in | Wt 241.0 lb

## 2022-11-03 DIAGNOSIS — Z3042 Encounter for surveillance of injectable contraceptive: Secondary | ICD-10-CM

## 2022-11-03 MED ORDER — MEDROXYPROGESTERONE ACETATE 150 MG/ML IM SUSP
150.0000 mg | Freq: Once | INTRAMUSCULAR | Status: AC
  Administered 2022-11-03: 150 mg via INTRAMUSCULAR

## 2022-11-03 NOTE — Progress Notes (Signed)
    NURSE VISIT NOTE  Subjective:    Patient ID: Cindy [name], female    DOB: 1996/05/09, 27 y.o.   MRN: [id number]  HPI  Patient is a 27 y.o. G58P3003 female who presents for depo provera injection.   Objective:    BP 130/80   Ht '5\' 5"'$  (1.651 m)   Wt 241 lb (109.3 kg)   LMP  (LMP Unknown)   Breastfeeding No   BMI 40.10 kg/m   Last Depo-Provera:  08/18/2022.. Side Effects if any: none. Serum HCG indicated? No . Depo-Provera 150 mg IM given by: Levert Feinstein, CMA. Site: Right Ventrogluteal   Assessment:   1. Encounter for Depo-Provera contraception      Plan:   Next appointment due between April  6-20    Cindy [name], [place]

## 2022-12-12 ENCOUNTER — Emergency Department
Admission: EM | Admit: 2022-12-12 | Discharge: 2022-12-12 | Disposition: A | Discharge status: Home / Self Care | Payer: Medicaid Other | Attending: Emergency Medicine | Admitting: Emergency Medicine

## 2022-12-12 ENCOUNTER — Encounter: Payer: Self-pay | Admitting: Emergency Medicine

## 2022-12-12 ENCOUNTER — Other Ambulatory Visit: Payer: Self-pay

## 2022-12-12 DIAGNOSIS — H6692 Otitis media, unspecified, left ear: Secondary | ICD-10-CM

## 2022-12-12 DIAGNOSIS — H9202 Otalgia, left ear: Secondary | ICD-10-CM | POA: Diagnosis present

## 2022-12-12 MED ORDER — CEFDINIR 300 MG PO CAPS: 300.0000 mg | ORAL_CAPSULE | Freq: Two times a day (BID) | ORAL | 0 refills | Status: DC

## 2022-12-12 NOTE — ED Provider Notes (Signed)
[facility] Provider Note    Event Date/Time   First MD Initiated Contact with Patient 12/12/22 [phone]     (approximate)   History   Fever and Otalgia   HPI  Cindy [name] is a 27 y.o. female   presents to the ED with complaint of left ear pain and low-grade fever.  Patient states that she has bilateral ear pain with the left being worse than the right.  She is also had nasal congestion for approximately 1 week.  She reports that she had ear infections as a child but none since.      Physical Exam   Triage Vital Signs: ED Triage Vitals  Enc Vitals Group     BP 12/12/22 0933 (!) 141/86     Pulse Rate 12/12/22 0933 96     Resp 12/12/22 0933 16     Temp 12/12/22 0933 98.2 F (36.8 C)     Temp Source 12/12/22 0933 Oral     SpO2 12/12/22 0933 100 %     Weight 12/12/22 0933 243 lb (110.2 kg)     Height 12/12/22 0933 5' (1.524 m)     Head Circumference --      Peak Flow --      Pain Score 12/12/22 0932 10     Pain Loc --      Pain Edu? --      Excl. in [place]? --     Most recent vital signs: Vitals:   12/12/22 0933  BP: (!) 141/86  Pulse: 96  Resp: 16  Temp: 98.2 F (36.8 C)  SpO2: 100%     General: Awake, no distress.  CV:  Good peripheral perfusion.  Heart regular rate and rhythm. Resp:  Normal effort.  Lungs clear bilaterally. Abd:  No distention.  Other:  Right EAC partially occluded with cerumen and TM is dull.  Left TM is erythematous without perforation.  Posterior pharynx clear.  Neck supple without adenopathy.   ED Results / Procedures / Treatments   Labs (all labs ordered are listed, but only abnormal results are displayed) Labs Reviewed - No data to display    PROCEDURES:  Critical Care performed:   Procedures   MEDICATIONS ORDERED IN ED: Medications - No data to display   IMPRESSION / MDM / [place] / ED COURSE  I reviewed the triage vital signs and the nursing notes.   Differential diagnosis  includes, but is not limited to, upper respiratory infection, otitis media, otalgia, pharyngitis.  27 year old female presents to the ED with complaint of bilateral ear pain and upper respiratory symptoms.  On exam left TM is erythematous and consistent with otitis media.  Patient does have a lot of nasal congestion and was advised to use any over-the-counter medication for this.  A prescription for cefdinir was sent to the pharmacy for her to begin taking twice daily until completely finished.  She is encouraged to use Tylenol or ibuprofen if needed for pain, aches or fever.  She is to follow-up with her PCP if any continued problems.      Patient's presentation is most consistent with acute complicated illness / injury requiring diagnostic workup.  FINAL CLINICAL IMPRESSION(S) / ED DIAGNOSES   Final diagnoses:  Acute left otitis media     Rx / DC Orders   ED Discharge Orders          Ordered    cefdinir (OMNICEF) 300 MG capsule  2 times daily  12/12/22 1007             Note:  This document was prepared using Dragon voice recognition software and may include unintentional dictation errors.   Johnn Hai, PA-C 12/12/22 1214    Lavonia Drafts, MD 12/12/22 [phone]

## 2022-12-12 NOTE — ED Triage Notes (Signed)
Pt reports fever when waking up today. Endorses bilateral ear pain, worse on left side. Also having sore throat.

## 2022-12-12 NOTE — Discharge Instructions (Signed)
Follow-up with your primary care provider if any continued problems or Dr. Tami Ribas who is on-call for ENT if not improving or worsening of your left ear pain.  A prescription for cefdinir was sent to the pharmacy for you to begin taking today twice a day for the next 10 days.  You may take Tylenol or ibuprofen as needed for ear pain.  Over-the-counter cough and congestion medication as needed.  Drink lots of fluids to stay hydrated.

## 2023-01-23 ENCOUNTER — Ambulatory Visit: Payer: Medicaid Other

## 2023-01-23 NOTE — Progress Notes (Deleted)
    NURSE VISIT NOTE  Subjective:    Patient ID: Cindy [name], female    DOB: 15-Nov-1995, 27 y.o.   MRN: [id number]  HPI  Patient is a 27 y.o. G68P3003 female who presents for depo provera injection.   Objective:    There were no vitals taken for this visit.  Last Annual: N/A. Last pap: 12/06/21. Last Depo-Provera: 11/03/22. Side Effects if any: none. Serum HCG indicated? No . Depo-Provera 150 mg IM given by: Sheliah Hatch, CMA. Site: Right Ventrogluteal  Lab Review  @THIS  VISIT ONLY@  Assessment:   No diagnosis found.   Plan:   Next appointment due between June 25 and  July 9,2024.    Fonda Kinder, CMA

## 2023-01-24 ENCOUNTER — Encounter: Payer: Self-pay | Admitting: Oncology

## 2023-01-29 ENCOUNTER — Other Ambulatory Visit: Payer: Self-pay

## 2023-01-29 ENCOUNTER — Emergency Department
Admission: EM | Admit: 2023-01-29 | Discharge: 2023-01-29 | Disposition: A | Discharge status: Home / Self Care | Payer: Medicaid Other | Attending: Emergency Medicine | Admitting: Emergency Medicine

## 2023-01-29 DIAGNOSIS — R1031 Right lower quadrant pain: Secondary | ICD-10-CM | POA: Insufficient documentation

## 2023-01-29 LAB — COMPREHENSIVE METABOLIC PANEL
ALT: 20 U/L (ref 0–44)
AST: 18 U/L (ref 15–41)
Albumin: 4 g/dL (ref 3.5–5.0)
Alkaline Phosphatase: 59 U/L (ref 38–126)
Anion gap: 8 (ref 5–15)
BUN: 11 mg/dL (ref 6–20)
CO2: 25 mmol/L (ref 22–32)
Calcium: 8.7 mg/dL — ABNORMAL LOW (ref 8.9–10.3)
Chloride: 108 mmol/L (ref 98–111)
Creatinine, Ser: 0.78 mg/dL (ref 0.44–1.00)
GFR, Estimated: >60 mL/min (ref >60)
Glucose, Bld: 104 mg/dL — ABNORMAL HIGH (ref 70–99)
Potassium: 3.6 mmol/L (ref 3.5–5.1)
Sodium: 141 mmol/L (ref 135–145)
Total Bilirubin: 0.5 mg/dL (ref 0.3–1.2)
Total Protein: 7.6 g/dL (ref 6.5–8.1)

## 2023-01-29 LAB — CBC
HCT: 36.4 % (ref 36.0–46.0)
Hemoglobin: 12.2 g/dL (ref 12.0–15.0)
MCH: 22.5 pg — ABNORMAL LOW (ref 26.0–34.0)
MCHC: 33.5 g/dL (ref 30.0–36.0)
MCV: 67.2 fL — ABNORMAL LOW (ref 80.0–100.0)
Platelets: 313 10*3/uL (ref 150–400)
RBC: 5.42 MIL/uL — ABNORMAL HIGH (ref 3.87–5.11)
RDW: 16.9 % — ABNORMAL HIGH (ref 11.5–15.5)
WBC: 9.5 10*3/uL (ref 4.0–10.5)
nRBC: 0 % (ref 0.0–0.2)

## 2023-01-29 LAB — URINALYSIS, ROUTINE W REFLEX MICROSCOPIC
Bilirubin Urine: NEGATIVE
Glucose, UA: NEGATIVE mg/dL
Hgb urine dipstick: NEGATIVE
Ketones, ur: NEGATIVE mg/dL
Leukocytes,Ua: NEGATIVE
Nitrite: NEGATIVE
Protein, ur: NEGATIVE mg/dL
Specific Gravity, Urine: 1.024 (ref 1.005–1.030)
pH: 7 (ref 5.0–8.0)

## 2023-01-29 LAB — LIPASE, BLOOD: Lipase: 24 U/L (ref 11–51)

## 2023-01-29 LAB — POC URINE PREG, ED: Preg Test, Ur: NEGATIVE

## 2023-01-29 NOTE — Discharge Instructions (Addendum)
Please use ibuprofen (Motrin) up to 800 mg every 8 hours, naproxen (Naprosyn) up to 500 mg every 12 hours, and/or acetaminophen (Tylenol) up to 4 g/day for any continued pain.  Please do not use this medication regimen for longer than 7 days 

## 2023-01-29 NOTE — ED Provider Notes (Incomplete)
[facility] Provider Note   Event Date/Time   First MD Initiated Contact with Patient 01/29/23 2017     (approximate) History  Abdominal Pain  HPI Cindy [name] is a 27 y.o. female with no stated past medical history presents for right lower quadrant abdominal pain that is been present over the last few weeks.  Patient states that any movement and lifting worsens this pain.  Patient denies any relieving factors.  Patient states that she has been taking ibuprofen/Tylenol for this pain with minimal relief.  Patient states that the only relieving factors are immobility.  Patient denies any history of trauma or inguinal hernia in that area. ROS: Patient currently denies any fever, vision changes, tinnitus, difficulty speaking, facial droop, sore throat, chest pain, shortness of breath, nausea/vomiting/diarrhea, dysuria, or weakness/numbness/paresthesias in any extremity   Physical Exam  Triage Vital Signs: ED Triage Vitals [01/29/23 1928]  Enc Vitals Group     BP 132/87     Pulse Rate 89     Resp 15     Temp 98.4 F (36.9 C)     Temp Source Oral     SpO2 98 %     Weight 240 lb (108.9 kg)     Height 5' (1.524 m)     Head Circumference      Peak Flow      Pain Score 10     Pain Loc      Pain Edu?      Excl. in [place]?    Most recent vital signs: Vitals:   01/29/23 1928  BP: 132/87  Pulse: 89  Resp: 15  Temp: 98.4 F (36.9 C)  SpO2: 98%   General: Awake, oriented x4. CV:  Good peripheral perfusion.  Resp:  Normal effort.  Abd:  No distention.  Tender to palpation over left inguinal region without any appreciable abdominal wall defects Other:  Young adult morbidly obese African-American female laying in bed in no acute distress ED Results / Procedures / Treatments  Labs (all labs ordered are listed, but only abnormal results are displayed) Labs Reviewed  CBC - Abnormal; Notable for the following components:      Result Value   RBC 5.42 (*)    MCV  67.2 (*)    MCH 22.5 (*)    RDW 16.9 (*)    All other components within normal limits  COMPREHENSIVE METABOLIC PANEL - Abnormal; Notable for the following components:   Glucose, Bld 104 (*)    Calcium 8.7 (*)    All other components within normal limits  URINALYSIS, ROUTINE W REFLEX MICROSCOPIC - Abnormal; Notable for the following components:   Color, Urine YELLOW (*)    APPearance CLEAR (*)    All other components within normal limits  LIPASE, BLOOD  POC URINE PREG, ED  PROCEDURES: Critical Care performed: No Procedures MEDICATIONS ORDERED IN ED: Medications - No data to display IMPRESSION / MDM / ASSESSMENT AND PLAN / ED COURSE  I reviewed the triage vital signs and the nursing notes.                             Patient's presentation is most consistent with acute presentation with potential threat to life or bodily function. Patients symptoms not typical for emergent causes of abdominal pain such as, but not limited to, appendicitis, abdominal aortic aneurysm, surgical biliary disease, pancreatitis, SBO, mesenteric ischemia, serious intra-abdominal bacterial illness. Presentation  also not typical of gynecologic emergencies such as TOA, Ovarian Torsion, PID. Not Ectopic. Doubt atypical ACS.  Pt tolerating PO. Disposition: Patient will be discharged with strict return precautions and follow up with primary MD within 12-24 hours for further evaluation. Patient understands that this still may have an early presentation of an emergent medical condition such as appendicitis that will require a recheck.   FINAL CLINICAL IMPRESSION(S) / ED DIAGNOSES   Final diagnoses:  Right lower quadrant abdominal pain   Rx / DC Orders   ED Discharge Orders     None      Note:  This document was prepared using Dragon voice recognition software and may include unintentional dictation errors.   Merwyn Katos, MD 01/30/23 1610    Merwyn Katos, MD 01/30/23 [phone]

## 2023-01-29 NOTE — ED Triage Notes (Signed)
Pt presents to ER with c/o RLQ/right groin pain that started around a week ago, but started to get worse today.  Pt states pain radiates around to right side of her back. Pt denies any urinary symptoms.  Pt endorses n/v but denies diarrhea.  Denies any previous abdominal surgeries.  Pt is otherwise A&O x4 and in NAD in triage.

## 2023-01-29 NOTE — ED Triage Notes (Signed)
EMS  brings pt in for c/o lower rt abd pain radiating into back x wk accomp by nausea

## 2023-01-31 ENCOUNTER — Ambulatory Visit: Payer: Medicaid Other | Admitting: Physician Assistant

## 2023-02-01 ENCOUNTER — Other Ambulatory Visit: Payer: Self-pay

## 2023-02-02 ENCOUNTER — Ambulatory Visit: Payer: Medicaid Other | Admitting: Physician Assistant

## 2023-02-19 ENCOUNTER — Ambulatory Visit: Payer: Medicaid Other | Admitting: Family Medicine

## 2023-03-28 ENCOUNTER — Ambulatory Visit: Payer: Medicaid Other | Admitting: Family Medicine

## 2023-08-09 ENCOUNTER — Telehealth: Payer: Self-pay | Admitting: Advanced Practice Midwife

## 2023-08-09 NOTE — Telephone Encounter (Signed)
Tried reaching out to patient to schedule annual appt phone number in system is wrong. Will send mychart message

## 2023-11-28 ENCOUNTER — Encounter: Payer: Self-pay | Admitting: Emergency Medicine

## 2023-11-28 ENCOUNTER — Emergency Department
Admission: EM | Admit: 2023-11-28 | Discharge: 2023-11-28 | Disposition: A | Discharge status: Home / Self Care | Payer: Medicaid Other | Attending: Emergency Medicine | Admitting: Emergency Medicine

## 2023-11-28 ENCOUNTER — Other Ambulatory Visit: Payer: Self-pay

## 2023-11-28 DIAGNOSIS — G43901 Migraine, unspecified, not intractable, with status migrainosus: Secondary | ICD-10-CM | POA: Insufficient documentation

## 2023-11-28 DIAGNOSIS — G43001 Migraine without aura, not intractable, with status migrainosus: Secondary | ICD-10-CM

## 2023-11-28 MED ORDER — DEXAMETHASONE SODIUM PHOSPHATE 10 MG/ML IJ SOLN
10.0000 mg | Freq: Once | INTRAMUSCULAR | Status: AC
  Administered 2023-11-28: 10 mg via INTRAVENOUS
  Filled 2023-11-28: qty 1

## 2023-11-28 MED ORDER — DIPHENHYDRAMINE HCL 50 MG/ML IJ SOLN
12.5000 mg | Freq: Once | INTRAMUSCULAR | Status: AC
  Administered 2023-11-28: 12.5 mg via INTRAVENOUS
  Filled 2023-11-28: qty 1

## 2023-11-28 MED ORDER — KETOROLAC TROMETHAMINE 15 MG/ML IJ SOLN
15.0000 mg | Freq: Once | INTRAMUSCULAR | Status: AC
  Administered 2023-11-28: 15 mg via INTRAVENOUS
  Filled 2023-11-28: qty 1

## 2023-11-28 MED ORDER — SODIUM CHLORIDE 0.9 % IV BOLUS
1000.0000 mL | Freq: Once | INTRAVENOUS | Status: AC
  Administered 2023-11-28: 1000 mL via INTRAVENOUS

## 2023-11-28 MED ORDER — ACETAMINOPHEN 325 MG PO TABS
650.0000 mg | ORAL_TABLET | Freq: Once | ORAL | Status: AC
  Administered 2023-11-28: 650 mg via ORAL
  Filled 2023-11-28: qty 2

## 2023-11-28 MED ORDER — METOCLOPRAMIDE HCL 5 MG/ML IJ SOLN
10.0000 mg | Freq: Once | INTRAMUSCULAR | Status: AC
  Administered 2023-11-28: 10 mg via INTRAVENOUS
  Filled 2023-11-28: qty 2

## 2023-11-28 NOTE — ED Triage Notes (Signed)
Patient to ED via POV for migraine. Pt states it has been ongoing since last week. Hx of same. States she has take tylenol and Excedrin with no relief. Patient on phone during triage.

## 2023-11-28 NOTE — ED Provider Notes (Signed)
[facility] Provider Note    Event Date/Time   First MD Initiated Contact with Patient 11/28/23 1931     (approximate)   History   Migraine   HPI  CASY TAVANO is a 28 y.o. female with PMH of anemia, depression and migraines presents for evaluation of a migraine.  Patient states that she has had a headache for the past week.  She has tried taking Tylenol and Excedrin at home without relief.  She does not have any aura before her migraines.  She reports that she has less than 5 migraines per month.  She has also had some associated nausea with the headache.  No fevers or neck pain.      Physical Exam   Triage Vital Signs: ED Triage Vitals  Encounter Vitals Group     BP 11/28/23 1838 (!) 153/98     Systolic BP Percentile --      Diastolic BP Percentile --      Pulse Rate 11/28/23 1838 91     Resp 11/28/23 1838 18     Temp 11/28/23 1838 98.5 F (36.9 C)     Temp Source 11/28/23 1838 Oral     SpO2 11/28/23 1838 97 %     Weight 11/28/23 1839 243 lb (110.2 kg)     Height 11/28/23 1839 5' (1.524 m)     Head Circumference --      Peak Flow --      Pain Score 11/28/23 1838 8     Pain Loc --      Pain Education --      Exclude from Growth Chart --     Most recent vital signs: Vitals:   11/28/23 1838 11/28/23 2250  BP: (!) 153/98 127/81  Pulse: 91 72  Resp: 18   Temp: 98.5 F (36.9 C)   SpO2: 97% 98%   General: Lethargic, resting in the room with the lights off CV:  Good peripheral perfusion.  RRR. Resp:  Normal effort.  CTAB. Abd:  No distention.  Other:  PERRL, EOM intact, cranial nerves II through XII grossly intact, sensation intact in bilateral upper and lower extremities, 5/5 strength in upper and lower extremities.   ED Results / Procedures / Treatments   Labs (all labs ordered are listed, but only abnormal results are displayed) Labs Reviewed - No data to display  PROCEDURES:  Critical Care performed:  No  Procedures   MEDICATIONS ORDERED IN ED: Medications  sodium chloride 0.9 % bolus 1,000 mL (0 mLs Intravenous Stopped 11/28/23 2252)  ketorolac (TORADOL) 15 MG/ML injection 15 mg (15 mg Intravenous Given 11/28/23 2123)  diphenhydrAMINE (BENADRYL) injection 12.5 mg (12.5 mg Intravenous Given 11/28/23 2123)  acetaminophen (TYLENOL) tablet 650 mg (650 mg Oral Given 11/28/23 2124)  dexamethasone (DECADRON) injection 10 mg (10 mg Intravenous Given 11/28/23 2123)  metoCLOPramide (REGLAN) injection 10 mg (10 mg Intravenous Given 11/28/23 2124)     IMPRESSION / MDM / ASSESSMENT AND PLAN / ED COURSE  I reviewed the triage vital signs and the nursing notes.                             28 year old female presents for evaluation migraine.  Vital signs are stable and patient was in some distress on exam, but nontoxic-appearing.  Differential diagnosis includes, but is not limited to, tension headache, cluster headache, migraine, subarachnoid hemorrhage, cervical artery dissection, giant cell arteritis, meningitis,  space occupying lesion.  Patient's presentation is most consistent with acute, uncomplicated illness.  There are no focal neurodeficits on exam so I do not think she warrants imaging at this time.  Patient has a history of migraines and states this feels the same.  She was given a combination of medicines to treat her headache.  She reported an improvement in her symptoms after administration.  We discussed how to manage migraines at home.  Patient was given information for patient for primary care follow-up.  She voiced understanding, all questions were answered and she was stable at discharge.     FINAL CLINICAL IMPRESSION(S) / ED DIAGNOSES   Final diagnoses:  Migraine without aura and with status migrainosus, not intractable     Rx / DC Orders   ED Discharge Orders     None        Note:  This document was prepared using Dragon voice recognition software and may include  unintentional dictation errors.   Cameron Ali, PA-C 11/28/23 2300    Concha Se, MD 11/29/23 [phone]

## 2023-11-28 NOTE — Discharge Instructions (Signed)
You were treated for a migraine today. If it reoccurs at home you can take excedrin or tylenol and ibuprofen. Make sure you stay well hydrated, eat regularly and get good sleep.   Below are some primary care doctors you can call to schedule an appointment.  Please go to the following website to schedule new (and existing) patient appointments:   [email or web address]   The following is a list of primary care offices in the area who are accepting new patients at this time.  Please reach out to one of them directly and let them know you would like to schedule an appointment to follow up on an Emergency Department visit, and/or to establish a new primary care provider (PCP).  There are likely other primary care clinics in the are who are accepting new patients, but this is an excellent place to start:  [facility] Lead physician: Dr Shirlee Latch [address] [place], [place] [address] [phone]  [facility] Lead Physician: Dr Alba Cory [address], [place], [place] [address] [phone]  [facility]  Lead Physician: Dr Olevia Perches [address] [place], [place] [address] [phone]  [facility] Lead Physician: Dr Sofie Hartigan [address], [place], [place] [address] [phone]  [facility] Lead Physician: Dr Bari Edward [address] [place], [place], [place] [address] [phone]

## 2023-12-14 ENCOUNTER — Other Ambulatory Visit: Payer: Self-pay

## 2023-12-14 ENCOUNTER — Emergency Department: Payer: Medicaid Other

## 2023-12-14 ENCOUNTER — Emergency Department
Admission: EM | Admit: 2023-12-14 | Discharge: 2023-12-14 | Disposition: A | Discharge status: Home / Self Care | Payer: Medicaid Other | Attending: Emergency Medicine | Admitting: Emergency Medicine

## 2023-12-14 DIAGNOSIS — O26891 Other specified pregnancy related conditions, first trimester: Secondary | ICD-10-CM | POA: Diagnosis present

## 2023-12-14 DIAGNOSIS — R1030 Lower abdominal pain, unspecified: Secondary | ICD-10-CM | POA: Insufficient documentation

## 2023-12-14 DIAGNOSIS — E876 Hypokalemia: Secondary | ICD-10-CM | POA: Diagnosis not present

## 2023-12-14 DIAGNOSIS — Z3A01 Less than 8 weeks gestation of pregnancy: Secondary | ICD-10-CM | POA: Insufficient documentation

## 2023-12-14 DIAGNOSIS — O99281 Endocrine, nutritional and metabolic diseases complicating pregnancy, first trimester: Secondary | ICD-10-CM | POA: Insufficient documentation

## 2023-12-14 DIAGNOSIS — O3680X0 Pregnancy with inconclusive fetal viability, not applicable or unspecified: Secondary | ICD-10-CM

## 2023-12-14 LAB — CBC
HCT: 38.1 % (ref 36.0–46.0)
Hemoglobin: 12.9 g/dL (ref 12.0–15.0)
MCH: 23 pg — ABNORMAL LOW (ref 26.0–34.0)
MCHC: 33.9 g/dL (ref 30.0–36.0)
MCV: 67.8 fL — ABNORMAL LOW (ref 80.0–100.0)
Platelets: 419 10*3/uL — ABNORMAL HIGH (ref 150–400)
RBC: 5.62 MIL/uL — ABNORMAL HIGH (ref 3.87–5.11)
RDW: 16.1 % — ABNORMAL HIGH (ref 11.5–15.5)
WBC: 10.1 10*3/uL (ref 4.0–10.5)
nRBC: 0 % (ref 0.0–0.2)

## 2023-12-14 LAB — COMPREHENSIVE METABOLIC PANEL
ALT: 18 U/L (ref 0–44)
AST: 13 U/L — ABNORMAL LOW (ref 15–41)
Albumin: 4.1 g/dL (ref 3.5–5.0)
Alkaline Phosphatase: 58 U/L (ref 38–126)
Anion gap: 9 (ref 5–15)
BUN: 8 mg/dL (ref 6–20)
CO2: 26 mmol/L (ref 22–32)
Calcium: 9.1 mg/dL (ref 8.9–10.3)
Chloride: 102 mmol/L (ref 98–111)
Creatinine, Ser: 0.57 mg/dL (ref 0.44–1.00)
GFR, Estimated: >60 mL/min (ref >60)
Glucose, Bld: 98 mg/dL (ref 70–99)
Potassium: 2.9 mmol/L — ABNORMAL LOW (ref 3.5–5.1)
Sodium: 137 mmol/L (ref 135–145)
Total Bilirubin: 0.6 mg/dL (ref 0.0–1.2)
Total Protein: 8.2 g/dL — ABNORMAL HIGH (ref 6.5–8.1)

## 2023-12-14 LAB — URINALYSIS, ROUTINE W REFLEX MICROSCOPIC
Bilirubin Urine: NEGATIVE
Glucose, UA: NEGATIVE mg/dL
Hgb urine dipstick: NEGATIVE
Ketones, ur: NEGATIVE mg/dL
Leukocytes,Ua: NEGATIVE
Nitrite: NEGATIVE
Protein, ur: NEGATIVE mg/dL
Specific Gravity, Urine: 1.014 (ref 1.005–1.030)
pH: 7 (ref 5.0–8.0)

## 2023-12-14 LAB — POC URINE PREG, ED: Preg Test, Ur: NEGATIVE

## 2023-12-14 LAB — LIPASE, BLOOD: Lipase: 25 U/L (ref 11–51)

## 2023-12-14 LAB — MAGNESIUM: Magnesium: 2 mg/dL (ref 1.7–2.4)

## 2023-12-14 LAB — HCG, QUANTITATIVE, PREGNANCY: hCG, Beta Chain, Quant, S: 357 m[IU]/mL — ABNORMAL HIGH (ref <5)

## 2023-12-14 MED ORDER — POTASSIUM CHLORIDE CRYS ER 20 MEQ PO TBCR
40.0000 meq | EXTENDED_RELEASE_TABLET | Freq: Once | ORAL | Status: AC
  Administered 2023-12-14: 40 meq via ORAL
  Filled 2023-12-14: qty 2

## 2023-12-14 MED ORDER — METOCLOPRAMIDE HCL 10 MG PO TABS
10.0000 mg | ORAL_TABLET | Freq: Once | ORAL | Status: AC
  Administered 2023-12-14: 10 mg via ORAL
  Filled 2023-12-14: qty 1

## 2023-12-14 NOTE — ED Triage Notes (Signed)
 C/O vomiting since Tuesday. + home pregnancy test at home.  Also C?O abd pain.  AAOx3.  Skin warm and dry. NAD

## 2023-12-14 NOTE — Discharge Instructions (Signed)
 You were seen in the emergency department for abdominal pain and a positive pregnancy test.  Your pregnancy hormone level was in the 300s in the emergency department.  You had an ultrasound that did not show a pregnancy yet.  You likely are too early to show signs of a pregnancy.  You need to follow-up as an outpatient with your OB group, they should be calling you on Monday to repeat your pregnancy level hormone, if you do not hear from them call them or return to the emergency department.  If you have vaginal bleeding or worsening abdominal pain return to the emergency department to further evaluate for miscarriage or an ectopic pregnancy.  You can take Tylenol for pain control.  Your potassium was mildly low when checked today.  Make sure to follow up with a primary doctor to follow up your labs.  Make sure to eat food high in potassium and magnesium - examples - potatoes, spinach, bananas, beans, avocadoes, oranges, nuts.  Thank you for choosing [place] for your health care, it was my pleasure to care for you today!  Corena Herter, MD

## 2023-12-14 NOTE — ED Provider Notes (Addendum)
 [facility] Provider Note    Event Date/Time   First MD Initiated Contact with Patient 12/14/23 2016     (approximate)   History   Abdominal Pain   HPI  Cindy [name] is a 28 y.o. female G5, P3 with 1 prior ectopic pregnancy, who presents to the emergency department with abdominal pain.  Endorses lower abdominal pain and back pain that started for the past 2 days.  Associate with some nausea but no episodes of vomiting.  Took a home pregnancy test and was positive.  Last menstrual cycle finished 2/5.  Does state that she had 1 prior miscarriage that was an ectopic pregnancy.  Denies any vaginal bleeding.  Denies dysuria, urinary urgency or frequency.  Denies any vaginal discharge.  On chart review patient is AB+     Physical Exam   Triage Vital Signs: ED Triage Vitals  Encounter Vitals Group     BP 12/14/23 1722 (!) 164/104     Systolic BP Percentile --      Diastolic BP Percentile --      Pulse Rate 12/14/23 1720 72     Resp 12/14/23 1720 16     Temp 12/14/23 1723 98.2 F (36.8 C)     Temp Source 12/14/23 1723 Oral     SpO2 12/14/23 1720 100 %     Weight 12/14/23 1721 242 lb 8.1 oz (110 kg)     Height --      Head Circumference --      Peak Flow --      Pain Score 12/14/23 1721 0     Pain Loc --      Pain Education --      Exclude from Growth Chart --     Most recent vital signs: Vitals:   12/14/23 1722 12/14/23 1723  BP: (!) 164/104   Pulse:    Resp:    Temp:  98.2 F (36.8 C)  SpO2:      Physical Exam Constitutional:      Appearance: She is well-developed.  HENT:     Head: Atraumatic.  Eyes:     Conjunctiva/sclera: Conjunctivae normal.  Cardiovascular:     Rate and Rhythm: Regular rhythm.  Pulmonary:     Effort: No respiratory distress.  Abdominal:     General: There is no distension.     Tenderness: There is no abdominal tenderness.  Musculoskeletal:        General: Normal range of motion.     Cervical back:  Normal range of motion.  Skin:    General: Skin is warm.     Capillary Refill: Capillary refill takes less than 2 seconds.  Neurological:     Mental Status: She is alert. Mental status is at baseline.     IMPRESSION / MDM / ASSESSMENT AND PLAN / ED COURSE  I reviewed the triage vital signs and the nursing notes.  Differential diagnosis including pregnancy, ectopic pregnancy, urinary tract infection, miscarriage   RADIOLOGY I independently reviewed imaging, my interpretation of imaging: Transvaginal ultrasound -no identifiable IUP    LABS (all labs ordered are listed, but only abnormal results are displayed) Labs interpreted as -    Labs Reviewed  COMPREHENSIVE METABOLIC PANEL - Abnormal; Notable for the following components:      Result Value   Potassium 2.9 (*)    Total Protein 8.2 (*)    AST 13 (*)    All other components within normal limits  CBC -  Abnormal; Notable for the following components:   RBC 5.62 (*)    MCV 67.8 (*)    MCH 23.0 (*)    RDW 16.1 (*)    Platelets 419 (*)    All other components within normal limits  URINALYSIS, ROUTINE W REFLEX MICROSCOPIC - Abnormal; Notable for the following components:   Color, Urine YELLOW (*)    APPearance CLEAR (*)    All other components within normal limits  HCG, QUANTITATIVE, PREGNANCY - Abnormal; Notable for the following components:   hCG, Beta Chain, Quant, S 357 (*)    All other components within normal limits  LIPASE, BLOOD  MAGNESIUM  POC URINE PREG, ED     MDM  Patient had a pregnancy test that was urine pregnancy test was negative but also had a quant that was obtained in triage that was positive at 387.  No significant leukocytosis or anemia.  No findings of urinary tract infection.  Patient did have findings concerning for hypokalemia at 2.9.  Patient was given Reglan after shared discussion about risk and benefits of giving Reglan while pregnant.  Given her p.o. potassium and adding on magnesium  level.   Clinical Course as of 12/14/23 2206  Fri Dec 14, 2023  2204 Ultrasound with no identifiable IUP -pregnancy of unknown location.  Discussed the patient's case with obstetrician team on-call discussed with Doreene Burke nurse practitioner, they will follow-up on Monday to repeat beta quant.  Patient was given return precautions. [SM]  2206 Patient left prior to receiving her discharge paperwork.  I had already discussed the discharge plan with the patient. [SM]    Clinical Course User Index [SM] Corena Herter, MD     PROCEDURES:  Critical Care performed: No  Procedures  Patient's presentation is most consistent with acute presentation with potential threat to life or bodily function.   MEDICATIONS ORDERED IN ED: Medications  metoCLOPramide (REGLAN) tablet 10 mg (10 mg Oral Given 12/14/23 2042)  potassium chloride SA (KLOR-CON M) CR tablet 40 mEq (40 mEq Oral Given 12/14/23 2042)    FINAL CLINICAL IMPRESSION(S) / ED DIAGNOSES   Final diagnoses:  Less than [redacted] weeks gestation of pregnancy  Pregnancy, location unknown  Hypokalemia     Rx / DC Orders   ED Discharge Orders     None        Note:  This document was prepared using Dragon voice recognition software and may include unintentional dictation errors.   Corena Herter, MD 12/14/23 4098    Corena Herter, MD 12/14/23 2206

## 2023-12-19 ENCOUNTER — Ambulatory Visit: Admitting: Certified Nurse Midwife

## 2023-12-26 ENCOUNTER — Encounter: Payer: Self-pay | Admitting: Certified Nurse Midwife

## 2024-02-27 ENCOUNTER — Ambulatory Visit (INDEPENDENT_AMBULATORY_CARE_PROVIDER_SITE_OTHER)

## 2024-02-27 VITALS — BP 141/85 | HR 89 | Wt 260.5 lb

## 2024-02-27 DIAGNOSIS — Z3201 Encounter for pregnancy test, result positive: Secondary | ICD-10-CM | POA: Diagnosis not present

## 2024-02-27 DIAGNOSIS — N912 Amenorrhea, unspecified: Secondary | ICD-10-CM

## 2024-02-27 LAB — POCT URINE PREGNANCY: Preg Test, Ur: POSITIVE — AB

## 2024-02-27 NOTE — Progress Notes (Signed)
    NURSE VISIT NOTE  Subjective:    Patient ID: Cindy [name], female    DOB: 1995-12-14, 28 y.o.   MRN: [id number]  HPI  Patient is a 28 y.o. G47P3003 female who presents for evaluation of amenorrhea. She believes she could be pregnant. Pregnancy is desired. Sexual Activity: single partner, contraception: none. Current symptoms also include: breast tenderness and nausea. Last period was abnormal.    Objective:    BP (!) 140/80 (BP Location: Left Arm, Patient Position: Sitting, Cuff Size: Large)   Pulse 95   Wt 260 lb 8 oz (118.2 kg)   LMP 12/15/2023   BMI 50.88 kg/m     Assessment:   1. Amenorrhea     Plan:   Pregnancy Test: Positive  Estimated Date of Delivery: 09/20/2024 GA: [redacted]w[redacted]d BP Cuff Measurement taken. Cuff Size Adult Large     Cathyann Cobia, CMA

## 2024-03-05 ENCOUNTER — Ambulatory Visit: Admission: RE | Admit: 2024-03-05 | Source: Ambulatory Visit

## 2024-03-06 ENCOUNTER — Emergency Department

## 2024-03-06 ENCOUNTER — Other Ambulatory Visit: Payer: Self-pay

## 2024-03-06 ENCOUNTER — Emergency Department
Admission: EM | Admit: 2024-03-06 | Discharge: 2024-03-06 | Disposition: A | Discharge status: Home / Self Care | Attending: Emergency Medicine | Admitting: Emergency Medicine

## 2024-03-06 ENCOUNTER — Encounter: Payer: Self-pay | Admitting: Oncology

## 2024-03-06 DIAGNOSIS — Z3A11 11 weeks gestation of pregnancy: Secondary | ICD-10-CM | POA: Diagnosis not present

## 2024-03-06 DIAGNOSIS — R1032 Left lower quadrant pain: Secondary | ICD-10-CM | POA: Diagnosis not present

## 2024-03-06 DIAGNOSIS — O26891 Other specified pregnancy related conditions, first trimester: Secondary | ICD-10-CM | POA: Insufficient documentation

## 2024-03-06 DIAGNOSIS — S39011A Strain of muscle, fascia and tendon of abdomen, initial encounter: Secondary | ICD-10-CM | POA: Diagnosis not present

## 2024-03-06 DIAGNOSIS — Y92410 Unspecified street and highway as the place of occurrence of the external cause: Secondary | ICD-10-CM | POA: Diagnosis not present

## 2024-03-06 LAB — URINALYSIS, ROUTINE W REFLEX MICROSCOPIC
Bilirubin Urine: NEGATIVE
Glucose, UA: NEGATIVE mg/dL
Hgb urine dipstick: NEGATIVE
Ketones, ur: NEGATIVE mg/dL
Leukocytes,Ua: NEGATIVE
Nitrite: NEGATIVE
Protein, ur: NEGATIVE mg/dL
Specific Gravity, Urine: 1.018 (ref 1.005–1.030)
pH: 7 (ref 5.0–8.0)

## 2024-03-06 LAB — CBC WITH DIFFERENTIAL/PLATELET
Abs Immature Granulocytes: 0.05 10*3/uL (ref 0.00–0.07)
Basophils Absolute: 0 10*3/uL (ref 0.0–0.1)
Basophils Relative: 0 %
Eosinophils Absolute: 0.3 10*3/uL (ref 0.0–0.5)
Eosinophils Relative: 3 %
HCT: 34.6 % — ABNORMAL LOW (ref 36.0–46.0)
Hemoglobin: 11.5 g/dL — ABNORMAL LOW (ref 12.0–15.0)
Immature Granulocytes: 1 %
Lymphocytes Relative: 24 %
Lymphs Abs: 2.6 10*3/uL (ref 0.7–4.0)
MCH: 22.1 pg — ABNORMAL LOW (ref 26.0–34.0)
MCHC: 33.2 g/dL (ref 30.0–36.0)
MCV: 66.4 fL — ABNORMAL LOW (ref 80.0–100.0)
Monocytes Absolute: 0.8 10*3/uL (ref 0.1–1.0)
Monocytes Relative: 8 %
Neutro Abs: 7 10*3/uL (ref 1.7–7.7)
Neutrophils Relative %: 64 %
Platelets: 308 10*3/uL (ref 150–400)
RBC: 5.21 MIL/uL — ABNORMAL HIGH (ref 3.87–5.11)
RDW: 16.5 % — ABNORMAL HIGH (ref 11.5–15.5)
WBC: 10.7 10*3/uL — ABNORMAL HIGH (ref 4.0–10.5)
nRBC: 0 % (ref 0.0–0.2)

## 2024-03-06 LAB — BASIC METABOLIC PANEL WITH GFR
Anion gap: 8 (ref 5–15)
BUN: 8 mg/dL (ref 6–20)
CO2: 23 mmol/L (ref 22–32)
Calcium: 9.2 mg/dL (ref 8.9–10.3)
Chloride: 106 mmol/L (ref 98–111)
Creatinine, Ser: 0.53 mg/dL (ref 0.44–1.00)
GFR, Estimated: >60 mL/min (ref >60)
Glucose, Bld: 106 mg/dL — ABNORMAL HIGH (ref 70–99)
Potassium: 3.7 mmol/L (ref 3.5–5.1)
Sodium: 137 mmol/L (ref 135–145)

## 2024-03-06 LAB — HCG, QUANTITATIVE, PREGNANCY: hCG, Beta Chain, Quant, S: 46727 m[IU]/mL — ABNORMAL HIGH (ref <5)

## 2024-03-06 LAB — POC URINE PREG, ED: Preg Test, Ur: POSITIVE — AB
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
## 2024-03-07 ENCOUNTER — Ambulatory Visit (INDEPENDENT_AMBULATORY_CARE_PROVIDER_SITE_OTHER)

## 2024-03-07 DIAGNOSIS — Z3481 Encounter for supervision of other normal pregnancy, first trimester: Secondary | ICD-10-CM | POA: Diagnosis not present

## 2024-03-07 DIAGNOSIS — Z3A01 Less than 8 weeks gestation of pregnancy: Secondary | ICD-10-CM

## 2024-03-07 DIAGNOSIS — O0990 Supervision of high risk pregnancy, unspecified, unspecified trimester: Secondary | ICD-10-CM | POA: Insufficient documentation

## 2024-03-07 DIAGNOSIS — O09299 Supervision of pregnancy with other poor reproductive or obstetric history, unspecified trimester: Secondary | ICD-10-CM

## 2024-03-07 DIAGNOSIS — Z8759 Personal history of other complications of pregnancy, childbirth and the puerperium: Secondary | ICD-10-CM | POA: Insufficient documentation

## 2024-03-07 DIAGNOSIS — Z3480 Encounter for supervision of other normal pregnancy, unspecified trimester: Secondary | ICD-10-CM | POA: Insufficient documentation

## 2024-03-07 HISTORY — DX: Supervision of pregnancy with other poor reproductive or obstetric history, unspecified trimester: O09.299

## 2024-03-07 NOTE — Progress Notes (Signed)
 New OB Intake  I connected with  Cindy [name] on 03/07/24 at  3:15 PM EDT by telephone Visit and verified that I am speaking with the correct person using two identifiers. Nurse is located at [facility] and pt is located at home.  I discussed the limitations, risks, security and privacy concerns of performing an evaluation and management service by telephone and the availability of in person appointments. I also discussed with the patient that there may be a patient responsible charge related to this service. The patient expressed understanding and agreed to proceed.  I explained I am completing New OB Intake today. We discussed her EDD of Unknown. Pt is G5/P3. I reviewed her allergies, medications, Medical/Surgical/OB history, and appropriate screenings. There are cats in the home: no. Based on history, this is a/an pregnancy uncomplicated . Her obstetrical history is significant for N/A.  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  Anatomy US  Explained Anatomy US  will be scheduled around 109 weeks gestational age.  Labs Discussed genetic screening with patient. Patient desires genetic testing to be drawn at new OB visit. Discussed possible labs to be drawn at new OB appointment.  COVID Vaccine Patient has not had COVID vaccine.   Social  Determinants of Health Food Insecurity: expresses food insecurity. Information given on local food banks. Transportation: Patient expressed transportation needs. Childcare: Discussed no children allowed at ultrasound appointments.   First visit review I reviewed new OB appt with pt. I explained she will have blood work and pap smear/pelvic exam if indicated. Explained pt will be seen by Fred Jacobsen, CNM at first visit; encounter routed to appropriate provider.   Higinio Love, CMA 03/07/2024  4:01 PM

## 2024-03-07 NOTE — Patient Instructions (Signed)
 First Trimester of Pregnancy  The first trimester of pregnancy starts on the first day of your last monthly period until the end of week 13. This is months 1 through 3 of pregnancy. A week after a sperm fertilizes an egg, the egg will implant into the wall of the uterus and begin to develop into a baby. Body changes during your first trimester Your body goes through many changes during pregnancy. The changes usually return to normal after your baby is born. Physical changes Your breasts may grow larger and may hurt. The area around your nipples may get darker. Your periods will stop. Your hair and nails may grow faster. You may pee more often. Health changes You may tire easily. Your gums may bleed and may be sensitive when you brush and floss. You may not feel hungry. You may have heartburn. You may throw up or feel like you may throw up. You may want to eat some foods, but not others. You may have headaches. You may have trouble pooping (constipation). Other changes Your emotions may change from day to day. You may have more dreams. Follow these instructions at home: Medicines Talk to your health care provider if you're taking medicines. Ask if the medicines are safe to take during pregnancy. Your provider may change the medicines that you take. Do not take any medicines unless told to by your provider. Take a prenatal vitamin that has at least 600 micrograms (mcg) of folic acid . Do not use herbal medicines, illegal substances, or medicines that are not approved by your provider. Eating and drinking While you're pregnant your body needs extra food for your growing baby. Talk with your provider about what to eat while pregnant. Activity Most women are able to exercise during pregnancy. Exercises may need to change as your pregnancy goes on. Talk to your provider about your activities and exercise routines. Relieving pain and discomfort Wear a good, supportive bra if your breasts  hurt. Rest with your legs raised if you have leg cramps or low back pain. Safety Wear your seatbelt at all times when you're in a car. Talk to your provider if someone hits you, hurts you, or yells at you. Talk with your provider if you're feeling sad or have thoughts of hurting yourself. Lifestyle Certain things can be harmful while you're pregnant. Follow these rules: Do not use hot tubs, steam rooms, or saunas. Do not douche. Do not use tampons or scented pads. Do not drink alcohol,smoke, vape, or use products with nicotine or tobacco in them. If you need help quitting, talk with your provider. Avoid cat litter boxes and soil used by cats. These things carry germs that can cause harm to your pregnancy and your baby. General instructions Keep all follow-up visits. It helps you and your unborn baby stay as healthy as possible. Write down your questions. Take them to your visits. Your provider will: Talk with you about your overall health. Give you advice or refer you to specialists who can help with different needs, including: Prenatal education classes. Mental health and counseling. Foods and healthy eating. Ask for help if you need help with food. Call your dentist and ask to be seen. Brush your teeth with a soft toothbrush. Floss gently. Where to find more information American Pregnancy Association: americanpregnancy.org [employer]: acog.org Office on [employer] Health: [employer] Contact a health care provider if: You feel dizzy, faint, or have a fever. You vomit or have watery poop (diarrhea) for 2  days or more. You have abnormal discharge or bleeding from your vagina. You have pain when you pee or your pee smells bad. You have cramps, pain, or pressure in your belly area. Get help right away if: You have trouble breathing or chest pain. You have any kind of injury, such as from a fall or a car crash. These symptoms may be an  emergency. Get help right away. Call 911. Do not wait to see if the symptoms will go away. Do not drive yourself to the hospital. This information is not intended to replace advice given to you by your health care provider. Make sure you discuss any questions you have with your health care provider. Document Revised: 07/05/2023 Document Reviewed: 02/02/2023 Elsevier Patient Education  2024 Else[employer] Inc.  Second Trimester of Pregnancy  The second trimester of pregnancy is from week 14 through week 27. This is months 4 through 6 of pregnancy. During the second trimester: Morning sickness is less or has stopped. You may have more energy. You may feel hungry more often. At this time, your unborn baby is growing very fast. At the end of the sixth month, the unborn baby may be up to 12 inches long and weigh about 1 pounds. You will likely start to feel the baby move between 16 and 20 weeks of pregnancy. Body changes during your second trimester Your body continues to change during this time. The changes usually go away after your baby is born. Physical changes You will gain more weight. Your belly will get bigger. You may begin to get stretch marks on your hips, belly, and breasts. Your breasts will keep growing and may hurt. You may get dark spots or blotches on your face. A dark line from your belly button to the pubic area may appear. This line is called linea nigra. Your hair may grow faster and get thicker. Health changes You may have headaches. You may have heartburn. You may pee more often. You may have swollen, bulging veins (varicose veins). You may have trouble pooping (constipation), or swollen veins in the butt that can itch or get painful (hemorrhoids). You may have back pain. This is caused by: Weight gain. Pregnancy hormones that are relaxing the joints in your pelvis. Follow these instructions at home: Medicines Talk to your health care provider if you're taking  medicines. Ask if the medicines are safe to take during pregnancy. Your provider may change the medicines that you take. Do not take any medicines unless told to by your provider. Take a prenatal vitamin that has at least 600 micrograms (mcg) of folic acid . Do not use herbal medicines, illegal drugs, or medicines that are not approved by your provider. Eating and drinking While you're pregnant your body needs extra food for your growing baby. Talk with your provider about what to eat while pregnant. Activity Most women are able to exercise during pregnancy. Exercises may need to change as your pregnancy goes on. Talk to your provider about your activities and exercise routines. Relieving pain and discomfort Wear a good, supportive bra if your breasts hurt. Rest with your legs raised if you have leg cramps or low back pain. Take warm sitz baths to soothe pain from hemorrhoids. Use hemorrhoid cream if your provider says it's okay. Do not douche. Do not use tampons or scented pads. Do not use hot tubs, steam rooms, or saunas. Safety Wear your seatbelt at all times when you're in a car. Talk to your provider if someone hits  you, hurts you, or yells at you. Talk with your provider if you're feeling sad or have thoughts of hurting yourself. Lifestyle Certain things can be harmful while you're pregnant. It's best to avoid the following: Do not drink alcohol,smoke, vape, or use products with nicotine or tobacco in them. If you need help quitting, talk with your provider. Avoid cat litter boxes and soil used by cats. These things carry germs that can cause harm to your pregnancy and your baby. General instructions Keep all follow-up visits. It helps you and your unborn baby stay as healthy as possible. Write down your questions. Take them to your prenatal visits. Your provider will: Talk with you about your overall health. Give you advice or refer you to specialists who can help with different  needs, including: Prenatal education classes. Mental health and counseling. Foods and healthy eating. Ask for help if you need help with food. Where to find more information American Pregnancy Association: americanpregnancy.org [employer]: acog.org Office on [employer] Health: [employer] Contact a health care provider if: You have a headache that does not go away when you take medicine. You have any of these problems: You can't eat or drink. You throw up or feel like you may throw up. You have watery poop (diarrhea) for 2 days or more. You have pain when you pee or your pee smells bad. You have been sick for 2 days or more and are not getting better. Contact your provider right away if: You have any of these coming from your vagina: Abnormal discharge. Bad-smelling fluid. Bleeding. Your baby is moving less than usual. You have contractions, belly cramping, or have pain in your pelvis or lower back. You have symptoms of high blood pressure or preeclampsia. These include: A severe, throbbing headache that does not go away. Sudden or extreme swelling of your face, hands, legs, or feet. Vision problems: You see spots. You have blurry vision. Your eyes are sensitive to light. If you can't reach the provider, go to an urgent care or emergency room. Get help right away if: You faint, become confused, or can't think clearly. You have chest pain or trouble breathing. You have any kind of injury, such as from a fall or a car crash. These symptoms may be an emergency. Call 911 right away. Do not wait to see if the symptoms will go away. Do not drive yourself to the hospital. This information is not intended to replace advice given to you by your health care provider. Make sure you discuss any questions you have with your health care provider. Document Revised: 07/05/2023 Document Reviewed: 02/02/2023 Elsevier Patient Education  2024 Elsevier  Inc.  Common Medications Safe in Pregnancy  Acne:      Constipation:  Benzoyl Peroxide     Colace  Clindamycin      Dulcolax Suppository  Topica Erythromycin     Fibercon  Salicylic Acid      Metamucil         Miralax  AVOID:        Senakot   Accutane    Cough:  Retin-A       Cough Drops  Tetracycline      Phenergan  w/ Codeine if Rx  Minocycline      Robitussin (Plain & DM)  Antibiotics:     Crabs/Lice:  Ceclor       RID  Cephalosporins    AVOID:  E-Mycins      Kwell  Keflex   Macrobid /Macrodantin   Diarrhea:  Penicillin       Kao-Pectate  Zithromax      Imodium AD         PUSH FLUIDS AVOID:       Cipro      Fever:  Tetracycline      Tylenol  (Regular or Extra  Minocycline       Strength)  Levaquin      Extra Strength-Do not          Exceed 8 tabs/24 hrs Caffeine :        200mg /day (equiv. To 1 cup of coffee or  approx. 3 12 oz sodas)         Gas: Cold/Hayfever:       Gas-X  Benadryl       Mylicon  Claritin       Phazyme  **Claritin-D        Chlor-Trimeton    Headaches:  Dimetapp      ASA-Free Excedrin  Drixoral-Non-Drowsy     Cold Compress  Mucinex (Guaifenasin)     Tylenol  (Regular or Extra  Sudafed/Sudafed-12 Hour     Strength)  **Sudafed PE Pseudoephedrine   Tylenol  Cold & Sinus     Vicks Vapor Rub  Zyrtec  **AVOID if Problems With Blood Pressure         Heartburn: Avoid lying down for at least 1 hour after meals  Aciphex      Maalox     Rash:  Milk of Magnesia     Benadryl     Mylanta       1% Hydrocortisone  Cream  Pepcid  Pepcid Complete   Sleep Aids:  Prevacid      Ambien   Prilosec       Benadryl   Rolaids       Chamomile Tea  Tums (Limit 4/day)     Unisom         Tylenol  PM         Warm milk-add vanilla or  Hemorrhoids:       Sugar for taste  Anusol /Anusol  H.C.  (RX: Analapram 2.5%)  Sugar Substitutes:  Hydrocortisone  OTC     Ok in moderation  Preparation H      Tucks        Vaseline lotion applied to tissue with  wiping    Herpes:     Throat:  Acyclovir      Oragel  Famvir  Valtrex     Vaccines:         Flu Shot Leg Cramps:       *Gardasil  Benadryl       Hepatitis A         Hepatitis B Nasal Spray:       Pneumovax  Saline Nasal Spray     Polio Booster         Tetanus Nausea:       Tuberculosis test or PPD  Vitamin B6 25 mg TID   AVOID:    Dramamine      *Gardasil  Emetrol       Live Poliovirus  Ginger Root 250 mg QID    MMR (measles, mumps &  High Complex Carbs @ Bedtime    rebella)  Sea Bands-Accupressure    Varicella (Chickenpox)  Unisom 1/2 tab TID     *No known complications           If received before Pain:         Known pregnancy;   Darvocet  Resume series after  Lortab        Delivery  Percocet    Yeast:   Tramadol       Femstat  Tylenol  3      Gyne-lotrimin  Ultram        Monistat  Vicodin           MISC:         All Sunscreens           Hair Coloring/highlights          Insect Repellant's          (Including DEET)         Mystic Tans   Commonly Asked Questions During Pregnancy  Cats: A parasite can be excreted in cat feces.  To avoid exposure you need to have another person empty the little box.  If you must empty the litter box you will need to wear gloves.  Wash your hands after handling your cat.  This parasite can also be found in raw or undercooked meat so this should also be avoided.  Colds, Sore Throats, Flu: Please check your medication sheet to see what you can take for symptoms.  If your symptoms are unrelieved by these medications please call the office.  Dental Work: Most any dental work Agricultural consultant recommends is permitted.  X-rays should only be taken during the first trimester if absolutely necessary.  Your abdomen should be shielded with a lead apron during all x-rays.  Please notify your provider prior to receiving any x-rays.  Novocaine is fine; gas is not recommended.  If your dentist requires a note from us  prior to dental work please call the  office and we will provide one for you.  Exercise: Exercise is an important part of staying healthy during your pregnancy.  You may continue most exercises you were accustomed to prior to pregnancy.  Later in your pregnancy you will most likely notice you have difficulty with activities requiring balance like riding a bicycle.  It is important that you listen to your body and avoid activities that put you at a higher risk of falling.  Adequate rest and staying well hydrated are a must!  If you have questions about the safety of specific activities ask your provider.    Exposure to Children with illness: Try to avoid obvious exposure; report any symptoms to us  when noted,  If you have chicken pos, red measles or mumps, you should be immune to these diseases.   Please do not take any vaccines while pregnant unless you have checked with your OB provider.  Fetal Movement: After 28 weeks we recommend you do "kick counts" twice daily.  Lie or sit down in a calm quiet environment and count your baby movements "kicks".  You should feel your baby at least 10 times per hour.  If you have not felt 10 kicks within the first hour get up, walk around and have something sweet to eat or drink then repeat for an additional hour.  If count remains less than 10 per hour notify your provider.  Fumigating: Follow your pest control agent's advice as to how long to stay out of your home.  Ventilate the area well before re-entering.  Hemorrhoids:   Most over-the-counter preparations can be used during pregnancy.  Check your medication to see what is safe to use.  It is important to use a stool softener or fiber in your diet and to drink lots of liquids.  If hemorrhoids seem  to be getting worse please call the office.   Hot Tubs:  Hot tubs Jacuzzis and saunas are not recommended while pregnant.  These increase your internal body temperature and should be avoided.  Intercourse:  Sexual intercourse is safe during pregnancy as  long as you are comfortable, unless otherwise advised by your provider.  Spotting may occur after intercourse; report any bright red bleeding that is heavier than spotting.  Labor:  If you know that you are in labor, please go to the hospital.  If you are unsure, please call the office and let us  help you decide what to do.  Lifting, straining, etc:  If your job requires heavy lifting or straining please check with your provider for any limitations.  Generally, you should not lift items heavier than that you can lift simply with your hands and arms (no back muscles)  Painting:  Paint fumes do not harm your pregnancy, but may make you ill and should be avoided if possible.  Latex or water based paints have less odor than oils.  Use adequate ventilation while painting.  Permanents & Hair Color:  Chemicals in hair dyes are not recommended as they cause increase hair dryness which can increase hair loss during pregnancy.  " Highlighting" and permanents are allowed.  Dye may be absorbed differently and permanents may not hold as well during pregnancy.  Sunbathing:  Use a sunscreen, as skin burns easily during pregnancy.  Drink plenty of fluids; avoid over heating.  Tanning Beds:  Because their possible side effects are still unknown, tanning beds are not recommended.  Ultrasound Scans:  Routine ultrasounds are performed at approximately 20 weeks.  You will be able to see your baby's general anatomy an if you would like to know the gender this can usually be determined as well.  If it is questionable when you conceived you may also receive an ultrasound early in your pregnancy for dating purposes.  Otherwise ultrasound exams are not routinely performed unless there is a medical necessity.  Although you can request a scan we ask that you pay for it when conducted because insurance does not cover " patient request" scans.  Work: If your pregnancy proceeds without complications you may work until your due  date, unless your physician or employer advises otherwise.  Round Ligament Pain/Pelvic Discomfort:  Sharp, shooting pains not associated with bleeding are fairly common, usually occurring in the second trimester of pregnancy.  They tend to be worse when standing up or when you remain standing for long periods of time.  These are the result of pressure of certain pelvic ligaments called "round ligaments".  Rest, Tylenol  and heat seem to be the most effective relief.  As the womb and fetus grow, they rise out of the pelvis and the discomfort improves.  Please notify the office if your pain seems different than that described.  It may represent a more serious condition.

## 2024-03-11 ENCOUNTER — Telehealth: Payer: Self-pay

## 2024-03-11 NOTE — Telephone Encounter (Signed)
Pt aware.

## 2024-03-11 NOTE — Telephone Encounter (Signed)
-----   Message from Verita Glassman Free sent at 03/09/2024  8:32 AM EDT ----- Regarding: RE: ER US  on 03/06/24. It looks like she is about 17 weeks. We'll get confirmation with her ultrasound on 5/28. I don't think it was an actual period in March, probably some early pregnancy bleeding. She was seen in the ED on 2/28 and had a positive pregnancy test at that time. ----- Message ----- From: Higinio Love, CMA Sent: 03/07/2024   4:11 PM EDT To: Verita Glassman Free, CNM Subject: ER US  on 03/06/24.                              Pt was seen in ER yesterday and had an US  done. Pt states she is very confused of how far along she is, was told two different GA. 11 weeks and 17 weeks. She said her periods have always been irregular and the last time she remembers having a period was beginning of March and it was very light and lasted four days only. Her cycles usually last 7-14 days. Was at ER on 12/14/23, had beta and US  done. Please advise on GA for patients chart. She also has upcoming US  on 03/12/24 at 8:00 am and she sees you that same day for her NOB physical at 1:55 pm.

## 2024-03-12 ENCOUNTER — Other Ambulatory Visit: Payer: Self-pay | Admitting: Obstetrics

## 2024-03-12 ENCOUNTER — Ambulatory Visit
Admission: RE | Admit: 2024-03-12 | Discharge: 2024-03-12 | Disposition: A | Discharge status: Home / Self Care | Source: Ambulatory Visit | Attending: Obstetrics | Admitting: Obstetrics

## 2024-03-12 ENCOUNTER — Other Ambulatory Visit (HOSPITAL_COMMUNITY)
Admission: RE | Admit: 2024-03-12 | Discharge: 2024-03-12 | Disposition: A | Discharge status: Home / Self Care | Source: Ambulatory Visit

## 2024-03-12 ENCOUNTER — Ambulatory Visit (INDEPENDENT_AMBULATORY_CARE_PROVIDER_SITE_OTHER)

## 2024-03-12 VITALS — BP 110/67 | HR 92 | Wt 265.1 lb

## 2024-03-12 DIAGNOSIS — Z3480 Encounter for supervision of other normal pregnancy, unspecified trimester: Secondary | ICD-10-CM

## 2024-03-12 DIAGNOSIS — N912 Amenorrhea, unspecified: Secondary | ICD-10-CM | POA: Insufficient documentation

## 2024-03-12 DIAGNOSIS — Z124 Encounter for screening for malignant neoplasm of cervix: Secondary | ICD-10-CM | POA: Insufficient documentation

## 2024-03-12 DIAGNOSIS — Z0283 Encounter for blood-alcohol and blood-drug test: Secondary | ICD-10-CM

## 2024-03-12 DIAGNOSIS — Z3A18 18 weeks gestation of pregnancy: Secondary | ICD-10-CM | POA: Diagnosis not present

## 2024-03-12 DIAGNOSIS — O99210 Obesity complicating pregnancy, unspecified trimester: Secondary | ICD-10-CM

## 2024-03-12 DIAGNOSIS — Z1379 Encounter for other screening for genetic and chromosomal anomalies: Secondary | ICD-10-CM

## 2024-03-12 DIAGNOSIS — Z3482 Encounter for supervision of other normal pregnancy, second trimester: Secondary | ICD-10-CM | POA: Diagnosis not present

## 2024-03-12 DIAGNOSIS — O0990 Supervision of high risk pregnancy, unspecified, unspecified trimester: Secondary | ICD-10-CM

## 2024-03-12 DIAGNOSIS — Z8759 Personal history of other complications of pregnancy, childbirth and the puerperium: Secondary | ICD-10-CM

## 2024-03-12 DIAGNOSIS — Z113 Encounter for screening for infections with a predominantly sexual mode of transmission: Secondary | ICD-10-CM

## 2024-03-12 MED ORDER — ASPIRIN 81 MG PO TBEC: 81.0000 mg | DELAYED_RELEASE_TABLET | Freq: Every day | ORAL | 2 refills | Status: AC

## 2024-03-12 NOTE — Progress Notes (Signed)
 NEW OB HISTORY AND PHYSICAL  SUBJECTIVE:       Cindy [name] is a 28 y.o. [phone] female, Patient's last menstrual period was 12/15/2023., Estimated Date of Delivery: 08/12/24, [redacted]w[redacted]d, by 17 week ultrasound, presents today for establishment of Prenatal Care. This was a surprise pregnancy but desired. Found out they are having a boy after having 3 girls!  She reports having some back pain but otherwise feeling well.    Social history Partner/Relationship: Jaiquan Corbett, boyfriend Living situation: lives with boyfriend's mom currently but planning to move soon.  Work: not currently working Exercise: walking Substance JYN:WGNF   Gynecologic History Pregnancy dating reviewed:  Patient's last menstrual period was 12/15/2023. Was very light, so may have been early pregnancy bleeding Contraception: none Last Pap: 2023. 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 Pap collected today.  Obsteric History  Hx of gestational hypertension - With third pregnancy.  OB History  Gravida Para Term Preterm AB Living  5 3 3  1 3   SAB IAB Ectopic Multiple Live Births  1   0 3    # Outcome Date GA Lbr Len/2nd Weight Sex Type Anes PTL Lv  5 Current           4 Term 06/28/22 [redacted]w[redacted]d / 00:37 6 lb 15.5 oz (3.16 kg) F Vag-Spont EPI  LIV  3 SAB 2021          2 Term 06/05/19 [redacted]w[redacted]d 403:54 / 03:46 6 lb 10.9 oz (3.03 kg) F Vag-Spont EPI  LIV  1 Term 10/24/16 [redacted]w[redacted]d 07:53 / 00:07 7 lb 1.9 oz (3.23 kg) F Vag-Spont EPI  LIV    Other Pertinent Health History: Obesity - Pre-pregnancy BMI of 50.78  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   HGSIL pap during pregnancy- needs colpo per Luster Salters and Cherry antpartally   History of spontaneous abortion, currently pregnant 03/07/2024   Morbidly obese (HCC)    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   Sickle cell trait (HCC)     Past Surgical History:  Procedure Laterality Date   COLONOSCOPY WITH PROPOFOL  N/A 06/15/2017   Procedure: COLONOSCOPY WITH PROPOFOL ;  Surgeon: Luke Salaam, MD;  Location: [facility] ENDOSCOPY;  Service: Gastroenterology;  Laterality: N/A;   ESOPHAGOGASTRODUODENOSCOPY (EGD) WITH PROPOFOL  N/A 06/15/2017   Procedure: ESOPHAGOGASTRODUODENOSCOPY (EGD) WITH PROPOFOL ;  Surgeon: Luke Salaam, MD;  Location: [facility] ENDOSCOPY;  Service: Gastroenterology;  Laterality: N/A;   MASTECTOMY     TONSILLECTOMY      Current Outpatient Medications on File Prior to Visit  Medication Sig Dispense Refill   Prenatal Vit-Fe Fumarate-FA (PRENATAL MULTIVITAMIN) TABS tablet Take 1 tablet by mouth daily at 12 noon.     No current facility-administered medications on file prior to visit.    No Known Allergies  Social History   Socioeconomic History   Marital status: Significant Other    Spouse name: Nurse, mental health   Number of children: 3   Years of education: Not on file   Highest education level: 10th grade  Occupational History   Occupation: Stay at [employer]  Tobacco Use   Smoking status: Never   Smokeless tobacco: Never  Vaping Use   Vaping status: Never Used  Substance and Sexual Activity   Alcohol use: No  Comment: former   Drug use: Not Currently    Types: Marijuana    Comment: last used in sept   Sexual activity: Not Currently    Partners: Male    Birth control/protection: None  Other Topics Concern   Not on file  Social History Narrative   Not on file   Social Drivers of Health   Financial Resource Strain: Medium Risk (03/06/2024)   Overall Financial Resource Strain (CARDIA)    Difficulty of Paying Living Expenses: Somewhat hard  Food Insecurity: Food Insecurity Present (03/06/2024)   Hunger Vital Sign    Worried About Running Out of Food in the Last Year: Sometimes true    Ran Out of Food in the Last Year: Sometimes true  Transportation Needs:  Unmet Transportation Needs (03/06/2024)   PRAPARE - Administrator, Civil Service (Medical): Yes    Lack of Transportation (Non-Medical): No  Physical Activity: Insufficiently Active (03/06/2024)   Exercise Vital Sign    Days of Exercise per Week: 3 days    Minutes of Exercise per Session: 30 min  Stress: Stress Concern Present (03/06/2024)   [employer] of Occupational Health - Occupational Stress Questionnaire    Feeling of Stress : To some extent  Social Connections: Moderately Isolated (03/06/2024)   Social Connection and Isolation Panel [NHANES]    Frequency of Communication with Friends and Family: Twice a week    Frequency of Social Gatherings with Friends and Family: Never    Attends Religious Services: 1 to 4 times per year    Active Member of [employer] or Organizations: No    Attends Engineer, structural: Not on file    Marital Status: Living with partner  Intimate Partner Violence: Not At Risk (03/07/2024)   Humiliation, Afraid, Rape, and Kick questionnaire    Fear of Current or Ex-Partner: No    Emotionally Abused: No    Physically Abused: No    Sexually Abused: No    Family History  Problem Relation Age of Onset   Thyroid disease Mother    Hypertension Mother    Sickle cell trait Mother    Sickle cell anemia Sister    Hypertension Maternal Grandmother    Breast cancer Maternal Grandmother     The following portions of the patient's history were reviewed and updated as appropriate: allergies, current medications, past OB history, past medical history, past surgical history, past family history, past social history, and problem list.  Constitutional: Denied constitutional symptoms, night sweats, recent illness, fatigue, fever, insomnia and weight loss.  Eyes: Denied eye symptoms, eye pain, photophobia, vision change and visual disturbance.  Ears/Nose/Throat/Neck: Denied ear, nose, throat or neck symptoms, hearing loss, nasal discharge, sinus  congestion and sore throat.  Cardiovascular: Denied cardiovascular symptoms, arrhythmia, chest pain/pressure, edema, exercise intolerance, orthopnea and palpitations.  Respiratory: Denied pulmonary symptoms, asthma, pleuritic pain, productive sputum, cough, dyspnea and wheezing.  Gastrointestinal: Denied, gastro-esophageal reflux, melena, nausea and vomiting.  Genitourinary: Denied genitourinary symptoms including symptomatic vaginal discharge, pelvic relaxation issues, and urinary complaints.  Musculoskeletal: Denied musculoskeletal symptoms, stiffness, swelling, muscle weakness and myalgia.  Dermatologic: Denied dermatology symptoms, rash and scar.  Neurologic: Denied neurology symptoms, dizziness, headache, neck pain and syncope.  Psychiatric: Denied psychiatric symptoms, anxiety and depression.  Endocrine: Denied endocrine symptoms including hot flashes and night sweats.     OBJECTIVE: Initial Physical Exam (New OB)  GENERAL APPEARANCE: alert, well appearing HEAD: normocephalic, atraumatic MOUTH: mucous membranes moist, pharynx normal without lesions THYROID: no  thyromegaly or masses present BREASTS: deferred LUNGS: clear to auscultation, no wheezes, rales or rhonchi, symmetric air entry HEART: regular rate and rhythm, no murmurs ABDOMEN: soft, nontender, nondistended, no abnormal masses, no epigastric pain EXTREMITIES: no redness or tenderness in the calves or thighs SKIN: normal coloration and turgor, no rashes LYMPH NODES: no adenopathy palpable NEUROLOGIC: alert, oriented, normal speech, no focal findings or movement disorder noted  PELVIC EXAM EXTERNAL GENITALIA: normal appearing vulva with no masses, tenderness or lesions VAGINA: no abnormal discharge or lesions CERVIX: no lesions or cervical motion tenderness  ASSESSMENT/PLAN  Normal pregnancy  Patient meets criteria for low dose ASA therapy.   Two or more of the following moderate risk indications: Obesity (body mass  index >30 kg/m2) , Sociodemographic characteristics (African American race, low socioeconomic level) , and Personal risk factors (eg, previous pregnancy with low birth weight or small for gestational age infant, previous adverse pregnancy outcome [eg, stillbirth], interval >10 years between pregnancies)  Reviewed recommendation for low dose ASA, prescription provided per patient request.    Obesity in pregnancy - Anesthesia consult ordered.  - Baseline PIH labs, HgbA1c, TSH collected today. - Recommended starting daily LDASA.  - MFM consult placed for anatomy US .   History of gestational hypertension - PIH labs collected. - Started on LDASA.    Routine prenatal care. We discussed an overview of prenatal care and when to call. Reviewed diet, exercise, and weight gain recommendations in pregnancy. Discussed benefits of breastfeeding and lactation resources at [facility]. I reviewed labs and answered all questions.  Anatomy ultrasound ordered for 18-20 weeks with MFM.  NOB labs: collected today with AFP, PIH baseline  See orders  Fred Jacobsen, [facility] 03/12/24 1:36 PM

## 2024-03-12 NOTE — Assessment & Plan Note (Signed)
-   PIH labs collected. - Started on LDASA.

## 2024-03-12 NOTE — Assessment & Plan Note (Signed)
-   Anesthesia consult ordered.  - Baseline PIH labs, HgbA1c, TSH collected today. - Recommended starting daily LDASA.  - MFM consult placed for anatomy US .

## 2024-03-14 ENCOUNTER — Ambulatory Visit: Payer: Self-pay

## 2024-03-14 LAB — AFP, SERUM, OPEN SPINA BIFIDA
AFP MoM: 1.55
AFP Value: 53.7 ng/mL
Gest. Age on Collection Date: 18.1 wk
Maternal Age At EDD: 26.2 a
OSBR Risk 1 IN: 4785
Test Results:: NEGATIVE
Weight: 265 [lb_av]

## 2024-03-14 LAB — COMPREHENSIVE METABOLIC PANEL WITH GFR
ALT: 17 IU/L (ref 0–32)
AST: 14 IU/L (ref 0–40)
Albumin: 4 g/dL (ref 4.0–5.0)
Alkaline Phosphatase: 76 IU/L (ref 44–121)
BUN/Creatinine Ratio: 9 (ref 9–23)
BUN: 5 mg/dL — ABNORMAL LOW (ref 6–20)
Bilirubin Total: <0.2 mg/dL (ref 0.0–1.2)
CO2: 20 mmol/L (ref 20–29)
Calcium: 9.4 mg/dL (ref 8.7–10.2)
Chloride: 101 mmol/L (ref 96–106)
Creatinine, Ser: 0.55 mg/dL — ABNORMAL LOW (ref 0.57–1.00)
Globulin, Total: 3.2 g/dL (ref 1.5–4.5)
Glucose: 76 mg/dL (ref 70–99)
Potassium: 4.1 mmol/L (ref 3.5–5.2)
Sodium: 135 mmol/L (ref 134–144)
Total Protein: 7.2 g/dL (ref 6.0–8.5)
eGFR: 130 mL/min/{1.73_m2} (ref >59)

## 2024-03-14 LAB — CBC/D/PLT+RPR+RH+ABO+RUBIGG...
Antibody Screen: NEGATIVE
Basophils Absolute: 0.1 10*3/uL (ref 0.0–0.2)
Basos: 0 %
EOS (ABSOLUTE): 0.2 10*3/uL (ref 0.0–0.4)
Eos: 2 %
HCV Ab: NONREACTIVE
HIV Screen 4th Generation wRfx: NONREACTIVE
Hematocrit: 39.1 % (ref 34.0–46.6)
Hemoglobin: 12.1 g/dL (ref 11.1–15.9)
Hepatitis B Surface Ag: NEGATIVE
Immature Grans (Abs): 0.1 10*3/uL (ref 0.0–0.1)
Immature Granulocytes: 1 %
Lymphocytes Absolute: 2.8 10*3/uL (ref 0.7–3.1)
Lymphs: 25 %
MCH: 22.4 pg — ABNORMAL LOW (ref 26.6–33.0)
MCHC: 30.9 g/dL — ABNORMAL LOW (ref 31.5–35.7)
MCV: 73 fL — ABNORMAL LOW (ref 79–97)
Monocytes Absolute: 0.8 10*3/uL (ref 0.1–0.9)
Monocytes: 7 %
Neutrophils Absolute: 7.3 10*3/uL — ABNORMAL HIGH (ref 1.4–7.0)
Neutrophils: 65 %
Platelets: 317 10*3/uL (ref 150–450)
RBC: 5.39 x10E6/uL — ABNORMAL HIGH (ref 3.77–5.28)
RDW: 18.5 % — ABNORMAL HIGH (ref 11.7–15.4)
RPR Ser Ql: NONREACTIVE
Rh Factor: POSITIVE
Rubella Antibodies, IGG: 11.5 {index} (ref >0.99)
Varicella zoster IgG: REACTIVE
WBC: 11.2 10*3/uL — ABNORMAL HIGH (ref 3.4–10.8)

## 2024-03-14 LAB — HEMOGLOBIN A1C
Est. average glucose Bld gHb Est-mCnc: 105 mg/dL
Hgb A1c MFr Bld: 5.3 % (ref 4.8–5.6)

## 2024-03-14 LAB — TSH+FREE T4
Free T4: 1.01 ng/dL (ref 0.82–1.77)
TSH: 1.59 u[IU]/mL (ref 0.450–4.500)

## 2024-03-14 LAB — HCV INTERPRETATION

## 2024-03-17 LAB — MATERNIT 21 PLUS CORE, BLOOD
Fetal Fraction: 10
Result (T21): NEGATIVE
Trisomy 13 (Patau syndrome): NEGATIVE
Trisomy 18 (Edwards syndrome): NEGATIVE
Trisomy 21 (Down syndrome): NEGATIVE

## 2024-03-19 LAB — CYTOLOGY - PAP
Chlamydia: NEGATIVE
Comment: NEGATIVE
Comment: NEGATIVE
Comment: NORMAL
Diagnosis: UNDETERMINED — AB
High risk HPV: NEGATIVE
Neisseria Gonorrhea: NEGATIVE

## 2024-03-24 DIAGNOSIS — Z8742 Personal history of other diseases of the female genital tract: Secondary | ICD-10-CM | POA: Insufficient documentation

## 2024-03-26 ENCOUNTER — Encounter
Admission: RE | Admit: 2024-03-26 | Discharge: 2024-03-26 | Disposition: A | Discharge status: Home / Self Care | Source: Ambulatory Visit | Attending: Anesthesiology | Admitting: Anesthesiology

## 2024-03-26 NOTE — Consult Note (Signed)
 [facility] Anesthesia Consultation  [name]:[id number] DOB: November 07, 1995 DOA: 03/26/2024 PCP: Juvenal Opoka   Requesting physician: Fred Jacobsen Date of consultation: 03/26/24 Reason for consultation:  Obesity during pregnancy  CHIEF COMPLAINT:  Obesity during pregnancy  HISTORY OF PRESENT ILLNESS: Cindy [name]  is a 28 y.o. 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   HGSIL pap during pregnancy- needs colpo per Luster Salters and Cherry antpartally   History of spontaneous abortion, currently pregnant 03/07/2024   Morbidly obese (HCC)    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   Sickle cell trait (HCC)     PAST SURGICAL HISTORY:  Past Surgical History:  Procedure Laterality Date   COLONOSCOPY WITH PROPOFOL  N/A 06/15/2017   Procedure: COLONOSCOPY WITH PROPOFOL ;  Surgeon: Luke Salaam, MD;  Location: [facility] ENDOSCOPY;  Service: Gastroenterology;  Laterality: N/A;   ESOPHAGOGASTRODUODENOSCOPY (EGD) WITH PROPOFOL  N/A 06/15/2017   Procedure: ESOPHAGOGASTRODUODENOSCOPY (EGD) WITH PROPOFOL ;  Surgeon: Luke Salaam, MD;  Location: [facility] ENDOSCOPY;  Service: Gastroenterology;  Laterality: N/A;   MASTECTOMY     TONSILLECTOMY      SOCIAL HISTORY:  Social History   Tobacco Use   Smoking status: Never   Smokeless tobacco: Never  Substance Use Topics   Alcohol use: No    Comment: former    FAMILY HISTORY:  Family History  Problem Relation Age of Onset   Thyroid disease Mother    Hypertension Mother    Sickle cell trait Mother    Sickle cell anemia Sister    Hypertension Maternal Grandmother    Breast cancer Maternal Grandmother     DRUG ALLERGIES: No Known Allergies  REVIEW OF SYSTEMS:   RESPIRATORY: No cough, shortness of breath, wheezing.   CARDIOVASCULAR: No chest pain, orthopnea, edema.  HEMATOLOGY: No anemia, easy bruising or bleeding SKIN: No rash or lesion. NEUROLOGIC: No tingling, numbness, weakness.  PSYCHIATRY: No anxiety or depression.   MEDICATIONS AT HOME:  Prior to Admission medications   Medication Sig Start Date End Date Taking? Authorizing Provider  aspirin  EC 81 MG tablet Take 1 tablet (81 mg total) by mouth daily. Take after 12 weeks for prevention of preeclampssia later in pregnancy 03/12/24   Free, Verita Glassman, CNM  Prenatal Vit-Fe Fumarate-FA (PRENATAL MULTIVITAMIN) TABS tablet Take 1 tablet by mouth daily at 12 noon.    [provider]      PHYSICAL EXAMINATION:   VITAL SIGNS: Last menstrual period 12/15/2023, not currently breastfeeding.  GENERAL:  28 y.o.-year-old patient no acute distress.  HEENT: Head atraumatic, normocephalic. Oropharynx and nasopharynx clear. MP 3, TM distance >3 cm, normal mouth opening. LUNGS: No use of accessory muscles of respiration.   EXTREMITIES: No pedal edema, cyanosis, or clubbing.  NEUROLOGIC: normal gait PSYCHIATRIC: The patient is alert and oriented x 3.  SKIN: No obvious rash, lesion, or ulcer.    IMPRESSION AND PLAN:  I had the distinct pleasure of meeting [name]  today for an OB anesthesia precheck.  She has a history of morbid obesity with a BMI is currently 51 at [redacted] weeks gestation.  Patient reports no previous problems with anesthesia and no problems with her back.  We discussed the risks of Obesity in Pregnancy including but  not limited to: Patients with a BMI > 40 are at increased risk for complications during pregnancy, such as hypertensive disorders of pregnancy, gestational diabetes, obstructive sleep apnea, thromboembolic disease, prolonged labor, operative vaginal delivery and need for cesarean delivery. There is an increased risk of airway complications, including inability to place a breathing tube, should an emergency cesarean delivery  be required. There is an increased risk of aspiration, where stomach contents get into the lungs and can cause inflammation, infection and even respiratory failure. Epidural placement is more difficult in obesity, often requires multiple attempts, more often results in inadequate pain relief, more often requires replacement due to movement of the epidural catheter and more often results in accidental dural puncture and post dural puncture headache.  I explained to the patient that she was borderline based on her BMI at this point in the pregnancy. After our discussion she said that she is interested in transferring her care to deliver at a bigger facility. I told her I would reach our to her OB team to help facilitate this. I then spoke with Fred Jacobsen who said that she would help with this.  Patient voiced understanding acknowledging that we had discussed her pathway forward.

## 2024-03-30 ENCOUNTER — Encounter: Payer: Self-pay | Admitting: Obstetrics and Gynecology

## 2024-03-30 ENCOUNTER — Observation Stay

## 2024-03-30 ENCOUNTER — Observation Stay
Admission: EM | Admit: 2024-03-30 | Discharge: 2024-03-30 | Disposition: A | Discharge status: Home / Self Care | Attending: Obstetrics and Gynecology | Admitting: Obstetrics and Gynecology

## 2024-03-30 ENCOUNTER — Other Ambulatory Visit: Payer: Self-pay

## 2024-03-30 DIAGNOSIS — O368320 Maternal care for abnormalities of the fetal heart rate or rhythm, second trimester, not applicable or unspecified: Secondary | ICD-10-CM | POA: Diagnosis present

## 2024-03-30 DIAGNOSIS — O9A212 Injury, poisoning and certain other consequences of external causes complicating pregnancy, second trimester: Secondary | ICD-10-CM | POA: Diagnosis not present

## 2024-03-30 DIAGNOSIS — Z3A20 20 weeks gestation of pregnancy: Secondary | ICD-10-CM

## 2024-03-30 MED ORDER — LACTATED RINGERS IV SOLN: 500.0000 mL | INTRAVENOUS | Status: DC | PRN

## 2024-03-30 MED ORDER — CYCLOBENZAPRINE HCL 10 MG PO TABS: 10.0000 mg | ORAL_TABLET | Freq: Three times a day (TID) | ORAL | 0 refills | Status: DC | PRN

## 2024-03-30 MED ORDER — ACETAMINOPHEN 325 MG PO TABS
650.0000 mg | ORAL_TABLET | ORAL | Status: DC | PRN
  Administered 2024-03-30: 650 mg via ORAL
  Filled 2024-03-30: qty 2

## 2024-03-30 MED ORDER — SOD CITRATE-CITRIC ACID 500-334 MG/5ML PO SOLN: 30.0000 mL | ORAL | Status: DC | PRN

## 2024-03-30 MED ORDER — HYDROXYZINE HCL 25 MG PO TABS: 50.0000 mg | ORAL_TABLET | Freq: Four times a day (QID) | ORAL | Status: DC | PRN

## 2024-03-30 MED ORDER — ONDANSETRON HCL 4 MG/2ML IJ SOLN: 4.0000 mg | Freq: Four times a day (QID) | INTRAMUSCULAR | Status: DC | PRN

## 2024-03-30 MED ORDER — OXYCODONE-ACETAMINOPHEN 5-325 MG PO TABS
1.0000 | ORAL_TABLET | Freq: Once | ORAL | Status: AC | PRN
  Administered 2024-03-30: 2 via ORAL
  Filled 2024-03-30: qty 2

## 2024-03-30 NOTE — Progress Notes (Signed)
 Pt cleared for discharge by [facility]. This RN to the room to tell patient she was cleared, patient was asleep on entry. I discussed the plan with the patient but she stated she was uncomfortable with being discharged and that she feels like something is not right and that she is worried she will need more medication for pain but doesn't think tylenol  will help. Patient reporting the pain has now moved to her back. Swanson CNM notified and en route to discuss plan of care with the patient.

## 2024-03-30 NOTE — Progress Notes (Signed)
Pt transported to US via transporter.

## 2024-03-30 NOTE — Progress Notes (Signed)
 Discharge instructions provided to patient. Patient verbalized understanding. Pt educated on signs and symptoms of labor, vaginal bleeding, LOF, and fetal movement. Red flag signs reviewed by RN. Patient discharged home with significant other in stable condition.

## 2024-03-30 NOTE — OB Triage Note (Signed)
 Patient is a 28 yo, G5P3, at 20 weeks 5 days. Patient presents to L&D via EMS after falling in the bathroom this morning. Patient states she went to use the bathroom and when she leaned over to wipe she felt dizzy and fell onto her abdomen.  Patient denies any vaginal bleeding or LOF. Patient reports she has not been able to feel consistent fetal movement at this point in her pregnancy. Monitors applied and assessing.  Patient states her abdomen hurts since falling and rates the pain 9/10 on the pain scale.VSS. Initial fetal heart tone 160 via doppler. Swanson CNM notified of patients arrival to unit. Plan to place in observation for toco monitoring and pain management.

## 2024-03-30 NOTE — Discharge Summary (Signed)
 LABOR & DELIVERY OB TRIAGE NOTE  SUBJECTIVE  HPI Cindy [name] is a 28 y.o. [phone] at [redacted]w[redacted]d who presents to Labor & Delivery for evaluation after a fall onto her abdomen. She reports that she was on the toilet and got weak and lightheaded when she was wiping. She reports that she fell forward onto her abdomen. She denied ctx, LOF, and vaginal bleeding. She did have abdominal and back pain which was improved with Percocet. FHT were reassuring; US  was normal; no contractions on toco over 4 hours.  OB History     Gravida  5   Para  3   Term  3   Preterm      AB  1   Living  3      SAB  1   IAB      Ectopic      Multiple  0   Live Births  3           Scheduled Meds: Continuous Infusions:  lactated ringers      PRN Meds:.acetaminophen , hydrOXYzine, lactated ringers , ondansetron , sodium citrate-citric acid   OBJECTIVE  BP 123/69 (BP Location: Left Arm)   Pulse 96   Temp 98.2 F (36.8 C) (Oral)   Resp 14   Ht 5' (1.524 m)   Wt 120.2 kg   LMP 12/15/2023   BMI 51.77 kg/m   General: alert, cooperative, NAD Abdomen: mildly tender to palpation in upper mid-abdomen, no rigidity or guarding  FHT: 160 Toco: no contractions  ASSESSMENT Impression  1) Pregnancy at [id number], [redacted]w[redacted]d, Estimated Date of Delivery: 08/12/24 2) Reassuring maternal/fetal status post-fall  PLAN 1) Discharge home with standard return/labor precautions. Instructed to return with vaginal bleeding, painful contractions, and decreased fetal movement.  2) Heating pad, rest, Tylenol , warm bath, small rx of Flexeril  sent for back pain 3) Will move ROB visit up to 04/02/24. Quetzali plans to transfer care to [facility] after her next visit.  Josue Nip, CNM 03/30/24  2:28 PM

## 2024-04-02 ENCOUNTER — Encounter: Admitting: Certified Nurse Midwife

## 2024-04-02 ENCOUNTER — Telehealth: Payer: Self-pay | Admitting: Certified Nurse Midwife

## 2024-04-02 NOTE — Telephone Encounter (Signed)
 Reached out to pt to reschedule ROB appt that was scheduled on 04/02/2024 at 8:35 with E. Slaughterbeck.  Spoke with pt and she was running behind.  I spoke with Sherline Distel and she stated that Jaz could wait and come to the appt that was already scheduled on 04/09/2024 with Dr. Luster Salters.  Deeana was seen at the ER and everything was fine per Sherline Distel.

## 2024-04-09 ENCOUNTER — Ambulatory Visit (INDEPENDENT_AMBULATORY_CARE_PROVIDER_SITE_OTHER): Admitting: Obstetrics and Gynecology

## 2024-04-09 ENCOUNTER — Encounter: Payer: Self-pay | Admitting: Obstetrics and Gynecology

## 2024-04-09 VITALS — BP 124/81 | HR 84 | Wt 276.5 lb

## 2024-04-09 DIAGNOSIS — O99210 Obesity complicating pregnancy, unspecified trimester: Secondary | ICD-10-CM | POA: Diagnosis not present

## 2024-04-09 DIAGNOSIS — O0990 Supervision of high risk pregnancy, unspecified, unspecified trimester: Secondary | ICD-10-CM | POA: Diagnosis not present

## 2024-04-09 DIAGNOSIS — Z3A22 22 weeks gestation of pregnancy: Secondary | ICD-10-CM

## 2024-04-09 NOTE — Progress Notes (Signed)
 ROB:  EGA = 2.1.  She has no complaints today.  She reports that she is feeling some fetal movement now.  She has had her anesthesia consult for elevated BMI and she will be transferring her care to [facility].  This will be her last visit with us .  Her anatomy scan remains incomplete and she has a follow-up scan scheduled with MFM on July 9. I have informed her that at her 1st or 2nd visit with [facility] she will need a 1 hour GCT performed. Release of records forms filled out today and signed.

## 2024-04-23 ENCOUNTER — Ambulatory Visit: Attending: Maternal & Fetal Medicine

## 2024-04-23 ENCOUNTER — Other Ambulatory Visit: Payer: Self-pay

## 2024-04-23 ENCOUNTER — Ambulatory Visit (HOSPITAL_BASED_OUTPATIENT_CLINIC_OR_DEPARTMENT_OTHER): Admitting: Maternal & Fetal Medicine

## 2024-04-23 VITALS — BP 130/87 | HR 90

## 2024-04-23 DIAGNOSIS — O09292 Supervision of pregnancy with other poor reproductive or obstetric history, second trimester: Secondary | ICD-10-CM | POA: Diagnosis not present

## 2024-04-23 DIAGNOSIS — O99212 Obesity complicating pregnancy, second trimester: Secondary | ICD-10-CM | POA: Diagnosis not present

## 2024-04-23 DIAGNOSIS — O285 Abnormal chromosomal and genetic finding on antenatal screening of mother: Secondary | ICD-10-CM | POA: Diagnosis not present

## 2024-04-23 DIAGNOSIS — Z8759 Personal history of other complications of pregnancy, childbirth and the puerperium: Secondary | ICD-10-CM

## 2024-04-23 DIAGNOSIS — Z363 Encounter for antenatal screening for malformations: Secondary | ICD-10-CM | POA: Insufficient documentation

## 2024-04-23 DIAGNOSIS — O99210 Obesity complicating pregnancy, unspecified trimester: Secondary | ICD-10-CM

## 2024-04-23 DIAGNOSIS — Z3A24 24 weeks gestation of pregnancy: Secondary | ICD-10-CM | POA: Diagnosis not present

## 2024-04-23 DIAGNOSIS — E669 Obesity, unspecified: Secondary | ICD-10-CM | POA: Diagnosis not present

## 2024-04-23 DIAGNOSIS — O99213 Obesity complicating pregnancy, third trimester: Secondary | ICD-10-CM | POA: Diagnosis not present

## 2024-04-23 DIAGNOSIS — D582 Other hemoglobinopathies: Secondary | ICD-10-CM
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
## 2024-05-04 ENCOUNTER — Observation Stay
Admission: EM | Admit: 2024-05-04 | Discharge: 2024-05-04 | Disposition: A | Discharge status: Home / Self Care | Attending: Family | Admitting: Family

## 2024-05-04 DIAGNOSIS — Z3A25 25 weeks gestation of pregnancy: Secondary | ICD-10-CM | POA: Diagnosis not present

## 2024-05-04 DIAGNOSIS — Z7982 Long term (current) use of aspirin: Secondary | ICD-10-CM | POA: Insufficient documentation

## 2024-05-04 DIAGNOSIS — O26852 Spotting complicating pregnancy, second trimester: Principal | ICD-10-CM

## 2024-05-04 DIAGNOSIS — O4692 Antepartum hemorrhage, unspecified, second trimester: Principal | ICD-10-CM | POA: Diagnosis present

## 2024-05-04 LAB — WET PREP, GENITAL
Clue Cells Wet Prep HPF POC: NONE SEEN
Sperm: NONE SEEN
Trich, Wet Prep: NONE SEEN
WBC, Wet Prep HPF POC: <10 (ref <10)
Yeast Wet Prep HPF POC: NONE SEEN

## 2024-05-04 NOTE — OB Triage Note (Signed)
 Patient given discharge instructions as per AVS. Patient waiting in room for dinner tray to be delivered. Will notify RN when eating. Patient verbalized understanding and agreed to plan.

## 2024-05-04 NOTE — Discharge Instructions (Signed)
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
## 2024-05-04 NOTE — OB Triage Note (Signed)
 Presents with concern of spotting. Notice today around 1200 when she went to bathroom some bright red spotting on tissue paper. Has not had any since. Denies sexual intercourse recently. Also concerned because she was told her BP ws high at last office visit and that could increase her risk for developing pre eclampsia.  States has recently noticed some increased swelling in her ankles and feet. Denies any headache, blurred vision, or epigastric pain. EFMs applied. CNM aware of patients arrival on unit.

## 2024-05-04 NOTE — ED Notes (Signed)
 Started to triage pt, pt is G4P3 here at 25+5 for pregnancy related complaint. Called upstairs, pt will be seen upstairs instead. Pt complains of bilateral ankle swelling since 3 weeks and was told is high risk for preeclampsia. Also having red spotting since today.

## 2024-05-07 ENCOUNTER — Telehealth: Payer: Self-pay | Admitting: Obstetrics and Gynecology

## 2024-05-07 NOTE — Telephone Encounter (Signed)
 PC from patient to cancel her future [facility] u/s visits due to [facility] to [facility]. Message sent to scheduling to cancel.  Barnie PHEBE Dixons, MS, CGC

## 2024-05-08 ENCOUNTER — Telehealth: Payer: Self-pay

## 2024-05-08 MED ORDER — CYCLOBENZAPRINE HCL 10 MG PO TABS: 10.0000 mg | ORAL_TABLET | Freq: Three times a day (TID) | ORAL | 0 refills | Status: AC | PRN

## 2024-05-08 NOTE — Telephone Encounter (Signed)
 Patient called requesting refill on her Flexeril . She is completely out of medication. Please advise. Order placed, needs to be signed or denied.

## 2024-05-08 NOTE — Telephone Encounter (Signed)
 Rx previously signed and sent.

## 2024-05-21 ENCOUNTER — Ambulatory Visit

## 2024-05-22 ENCOUNTER — Encounter: Admitting: Advanced Practice Midwife

## 2024-06-18 ENCOUNTER — Ambulatory Visit

## 2024-08-18 ENCOUNTER — Telehealth: Payer: Self-pay | Admitting: Licensed Clinical Social Worker

## 2024-08-18 NOTE — Telephone Encounter (Signed)
-----   Message from Lennox FORBES Code sent at 08/15/2024 10:05 AM EDT ----- Regarding: Patient Referral Good morning Alan,  I hope you are well. I have a patient referral her name is Tonetta, she is 6 weeks postpartum and has been diagnosed with anxiety in the past. She is currently dealing with postpartum depression, I performed Edinburgh depression scale  on her today , her score came out to a 12. She reports that her mood is able to be stable but she would like to receive help. She asked if I could make a referral with you and she reports it would be easier for her to do virtual sessions as it is hard to go out with her baby. Her epic number is correct I have also shared your number with her. Please let me know if you are able to get a hold of her.  Have a nice day.
